# Patient Record
Sex: Male | Born: 1945 | Race: White | Hispanic: No | Smoking: Former smoker
Health system: Southern US, Community
[De-identification: ages and names within clinical notes are randomized; demographics above are authoritative.]

## PROBLEM LIST (undated history)

## (undated) DIAGNOSIS — I639 Cerebral infarction, unspecified: Secondary | ICD-10-CM

## (undated) DIAGNOSIS — I739 Peripheral vascular disease, unspecified: Secondary | ICD-10-CM

## (undated) DIAGNOSIS — R9430 Abnormal result of cardiovascular function study, unspecified: Secondary | ICD-10-CM

## (undated) DIAGNOSIS — I10 Essential (primary) hypertension: Secondary | ICD-10-CM

## (undated) DIAGNOSIS — Z7289 Other problems related to lifestyle: Secondary | ICD-10-CM

## (undated) DIAGNOSIS — I340 Nonrheumatic mitral (valve) insufficiency: Secondary | ICD-10-CM

## (undated) DIAGNOSIS — I4891 Unspecified atrial fibrillation: Secondary | ICD-10-CM

## (undated) DIAGNOSIS — I779 Disorder of arteries and arterioles, unspecified: Secondary | ICD-10-CM

## (undated) DIAGNOSIS — T148XXA Other injury of unspecified body region, initial encounter: Secondary | ICD-10-CM

## (undated) DIAGNOSIS — I509 Heart failure, unspecified: Secondary | ICD-10-CM

## (undated) DIAGNOSIS — M25539 Pain in unspecified wrist: Secondary | ICD-10-CM

## (undated) DIAGNOSIS — N189 Chronic kidney disease, unspecified: Secondary | ICD-10-CM

## (undated) DIAGNOSIS — F4310 Post-traumatic stress disorder, unspecified: Secondary | ICD-10-CM

## (undated) DIAGNOSIS — I7100 Dissection of unspecified site of aorta: Secondary | ICD-10-CM

## (undated) DIAGNOSIS — R339 Retention of urine, unspecified: Secondary | ICD-10-CM

## (undated) DIAGNOSIS — IMO0002 Reserved for concepts with insufficient information to code with codable children: Secondary | ICD-10-CM

## (undated) DIAGNOSIS — I351 Nonrheumatic aortic (valve) insufficiency: Secondary | ICD-10-CM

## (undated) DIAGNOSIS — F1090 Alcohol use, unspecified, uncomplicated: Secondary | ICD-10-CM

## (undated) DIAGNOSIS — N400 Enlarged prostate without lower urinary tract symptoms: Secondary | ICD-10-CM

## (undated) DIAGNOSIS — R079 Chest pain, unspecified: Secondary | ICD-10-CM

## (undated) DIAGNOSIS — Z789 Other specified health status: Secondary | ICD-10-CM

## (undated) HISTORY — PX: EXTRACORPOREAL CIRCULATION: SHX266

## (undated) HISTORY — PX: MEDIAN STERNOTOMY: SUR860

## (undated) HISTORY — DX: Disorder of arteries and arterioles, unspecified: I77.9

## (undated) HISTORY — DX: Alcohol use, unspecified, uncomplicated: F10.90

## (undated) HISTORY — DX: Other injury of unspecified body region, initial encounter: T14.8XXA

## (undated) HISTORY — DX: Benign prostatic hyperplasia without lower urinary tract symptoms: N40.0

## (undated) HISTORY — PX: CARDIAC VALVE REPLACEMENT: SHX585

## (undated) HISTORY — DX: Abnormal result of cardiovascular function study, unspecified: R94.30

## (undated) HISTORY — DX: Retention of urine, unspecified: R33.9

## (undated) HISTORY — PX: LUMBAR LAMINECTOMY: SHX95

## (undated) HISTORY — DX: Reserved for concepts with insufficient information to code with codable children: IMO0002

## (undated) HISTORY — DX: Other specified health status: Z78.9

## (undated) HISTORY — DX: Unspecified atrial fibrillation: I48.91

## (undated) HISTORY — DX: Essential (primary) hypertension: I10

## (undated) HISTORY — DX: Other problems related to lifestyle: Z72.89

## (undated) HISTORY — DX: Cerebral infarction, unspecified: I63.9

## (undated) HISTORY — DX: Peripheral vascular disease, unspecified: I73.9

## (undated) HISTORY — DX: Pain in unspecified wrist: M25.539

## (undated) HISTORY — PX: HEMORRHOID SURGERY: SHX153

## (undated) HISTORY — PX: BACK SURGERY: SHX140

## (undated) HISTORY — DX: Chest pain, unspecified: R07.9

---

## 2001-02-23 ENCOUNTER — Encounter: Payer: Self-pay | Admitting: Internal Medicine

## 2001-02-23 ENCOUNTER — Ambulatory Visit (HOSPITAL_COMMUNITY): Admission: RE | Admit: 2001-02-23 | Discharge: 2001-02-23 | Payer: Self-pay | Admitting: Internal Medicine

## 2002-01-12 ENCOUNTER — Encounter: Payer: Self-pay | Admitting: Internal Medicine

## 2002-01-12 ENCOUNTER — Ambulatory Visit (HOSPITAL_COMMUNITY): Admission: RE | Admit: 2002-01-12 | Discharge: 2002-01-12 | Payer: Self-pay | Admitting: Internal Medicine

## 2002-02-03 ENCOUNTER — Ambulatory Visit (HOSPITAL_COMMUNITY): Admission: RE | Admit: 2002-02-03 | Discharge: 2002-02-03 | Payer: Self-pay | Admitting: Internal Medicine

## 2002-03-22 ENCOUNTER — Ambulatory Visit (HOSPITAL_COMMUNITY): Admission: RE | Admit: 2002-03-22 | Discharge: 2002-03-22 | Payer: Self-pay | Admitting: Internal Medicine

## 2002-03-22 ENCOUNTER — Encounter: Payer: Self-pay | Admitting: Internal Medicine

## 2003-02-20 ENCOUNTER — Emergency Department (HOSPITAL_COMMUNITY): Admission: EM | Admit: 2003-02-20 | Discharge: 2003-02-20 | Payer: Self-pay | Admitting: *Deleted

## 2003-02-20 ENCOUNTER — Encounter: Payer: Self-pay | Admitting: *Deleted

## 2003-09-15 ENCOUNTER — Ambulatory Visit (HOSPITAL_COMMUNITY): Admission: RE | Admit: 2003-09-15 | Discharge: 2003-09-15 | Payer: Self-pay | Admitting: Internal Medicine

## 2003-09-26 ENCOUNTER — Ambulatory Visit (HOSPITAL_COMMUNITY): Admission: RE | Admit: 2003-09-26 | Discharge: 2003-09-26 | Payer: Self-pay | Admitting: Internal Medicine

## 2008-06-23 ENCOUNTER — Ambulatory Visit: Payer: Self-pay | Admitting: Cardiology

## 2008-06-23 ENCOUNTER — Inpatient Hospital Stay (HOSPITAL_COMMUNITY): Admission: EM | Admit: 2008-06-23 | Discharge: 2008-06-28 | Payer: Self-pay | Admitting: Emergency Medicine

## 2008-06-23 ENCOUNTER — Ambulatory Visit: Payer: Self-pay | Admitting: Orthopedic Surgery

## 2008-06-24 ENCOUNTER — Encounter (INDEPENDENT_AMBULATORY_CARE_PROVIDER_SITE_OTHER): Payer: Self-pay | Admitting: Family Medicine

## 2008-06-28 ENCOUNTER — Encounter: Payer: Self-pay | Admitting: Orthopedic Surgery

## 2008-07-29 ENCOUNTER — Emergency Department (HOSPITAL_COMMUNITY): Admission: EM | Admit: 2008-07-29 | Discharge: 2008-07-29 | Payer: Self-pay | Admitting: Emergency Medicine

## 2008-10-21 ENCOUNTER — Ambulatory Visit: Payer: Self-pay | Admitting: Cardiology

## 2008-10-21 ENCOUNTER — Encounter: Payer: Self-pay | Admitting: Internal Medicine

## 2008-10-21 ENCOUNTER — Ambulatory Visit: Payer: Self-pay | Admitting: Thoracic Surgery (Cardiothoracic Vascular Surgery)

## 2008-10-21 ENCOUNTER — Encounter: Payer: Self-pay | Admitting: Thoracic Surgery (Cardiothoracic Vascular Surgery)

## 2008-10-21 ENCOUNTER — Inpatient Hospital Stay (HOSPITAL_COMMUNITY): Admission: AD | Admit: 2008-10-21 | Discharge: 2008-11-14 | Payer: Self-pay | Admitting: Cardiology

## 2008-10-21 HISTORY — PX: AORTIC VALVE REPLACEMENT: SHX41

## 2008-11-10 ENCOUNTER — Ambulatory Visit: Payer: Self-pay | Admitting: Physical Medicine & Rehabilitation

## 2008-11-16 DIAGNOSIS — I10 Essential (primary) hypertension: Secondary | ICD-10-CM | POA: Insufficient documentation

## 2008-11-16 DIAGNOSIS — M25539 Pain in unspecified wrist: Secondary | ICD-10-CM

## 2008-11-16 DIAGNOSIS — T148XXA Other injury of unspecified body region, initial encounter: Secondary | ICD-10-CM | POA: Insufficient documentation

## 2008-11-18 ENCOUNTER — Emergency Department (HOSPITAL_COMMUNITY): Admission: EM | Admit: 2008-11-18 | Discharge: 2008-11-18 | Payer: Self-pay | Admitting: Emergency Medicine

## 2008-11-25 ENCOUNTER — Ambulatory Visit: Payer: Self-pay | Admitting: Thoracic Surgery (Cardiothoracic Vascular Surgery)

## 2008-11-25 ENCOUNTER — Encounter
Admission: RE | Admit: 2008-11-25 | Discharge: 2008-11-25 | Payer: Self-pay | Admitting: Thoracic Surgery (Cardiothoracic Vascular Surgery)

## 2008-12-12 ENCOUNTER — Ambulatory Visit: Payer: Self-pay | Admitting: Cardiology

## 2009-02-10 ENCOUNTER — Ambulatory Visit: Payer: Self-pay | Admitting: Cardiothoracic Surgery

## 2009-02-10 ENCOUNTER — Ambulatory Visit: Payer: Self-pay | Admitting: Cardiology

## 2009-02-10 ENCOUNTER — Encounter: Payer: Self-pay | Admitting: Cardiothoracic Surgery

## 2009-02-10 ENCOUNTER — Observation Stay (HOSPITAL_COMMUNITY): Admission: EM | Admit: 2009-02-10 | Discharge: 2009-02-12 | Payer: Self-pay | Admitting: Emergency Medicine

## 2009-02-11 ENCOUNTER — Encounter: Payer: Self-pay | Admitting: Cardiothoracic Surgery

## 2009-02-12 ENCOUNTER — Encounter: Payer: Self-pay | Admitting: Cardiothoracic Surgery

## 2009-05-08 ENCOUNTER — Encounter
Admission: RE | Admit: 2009-05-08 | Discharge: 2009-05-08 | Payer: Self-pay | Admitting: Thoracic Surgery (Cardiothoracic Vascular Surgery)

## 2009-05-23 ENCOUNTER — Ambulatory Visit: Payer: Self-pay | Admitting: Cardiology

## 2009-05-23 DIAGNOSIS — F341 Dysthymic disorder: Secondary | ICD-10-CM

## 2009-06-01 ENCOUNTER — Encounter (INDEPENDENT_AMBULATORY_CARE_PROVIDER_SITE_OTHER): Payer: Self-pay | Admitting: *Deleted

## 2009-06-07 ENCOUNTER — Encounter: Payer: Self-pay | Admitting: Cardiology

## 2009-06-08 ENCOUNTER — Encounter: Payer: Self-pay | Admitting: Cardiology

## 2009-06-09 ENCOUNTER — Encounter: Payer: Self-pay | Admitting: Cardiology

## 2009-06-14 ENCOUNTER — Encounter: Payer: Self-pay | Admitting: Cardiology

## 2009-06-26 ENCOUNTER — Encounter: Payer: Self-pay | Admitting: Cardiology

## 2009-10-30 IMAGING — CR DG CHEST 2V
2 series · 2 of 2 positions shown · non-contrast
Comparison: 11/07/2008

CLINICAL DATA: Left chest pain.  Status post CABG 3 weeks ago.
Acute chest pain today.

CHEST - 2 VIEW

[w chest lat]
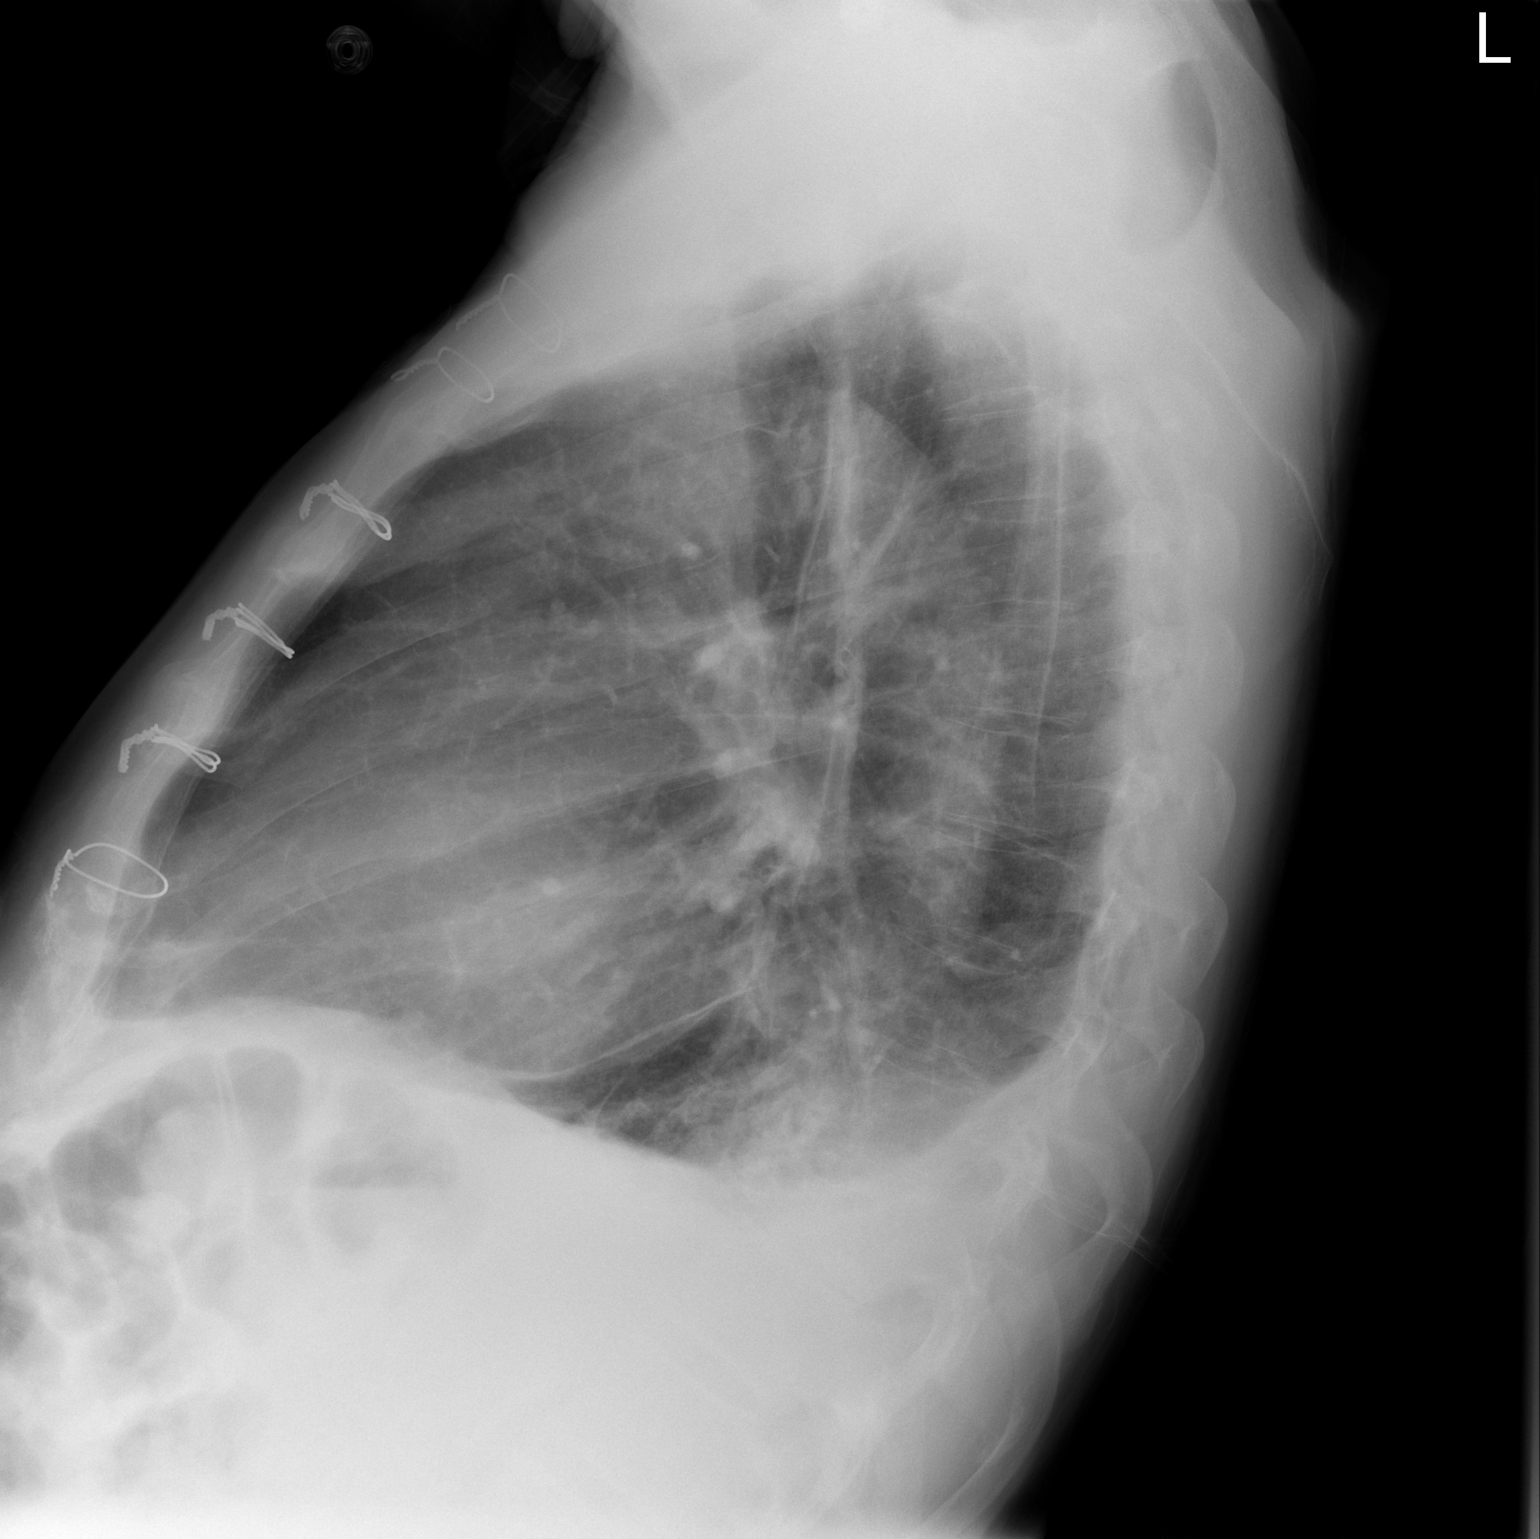

[w chest pa]
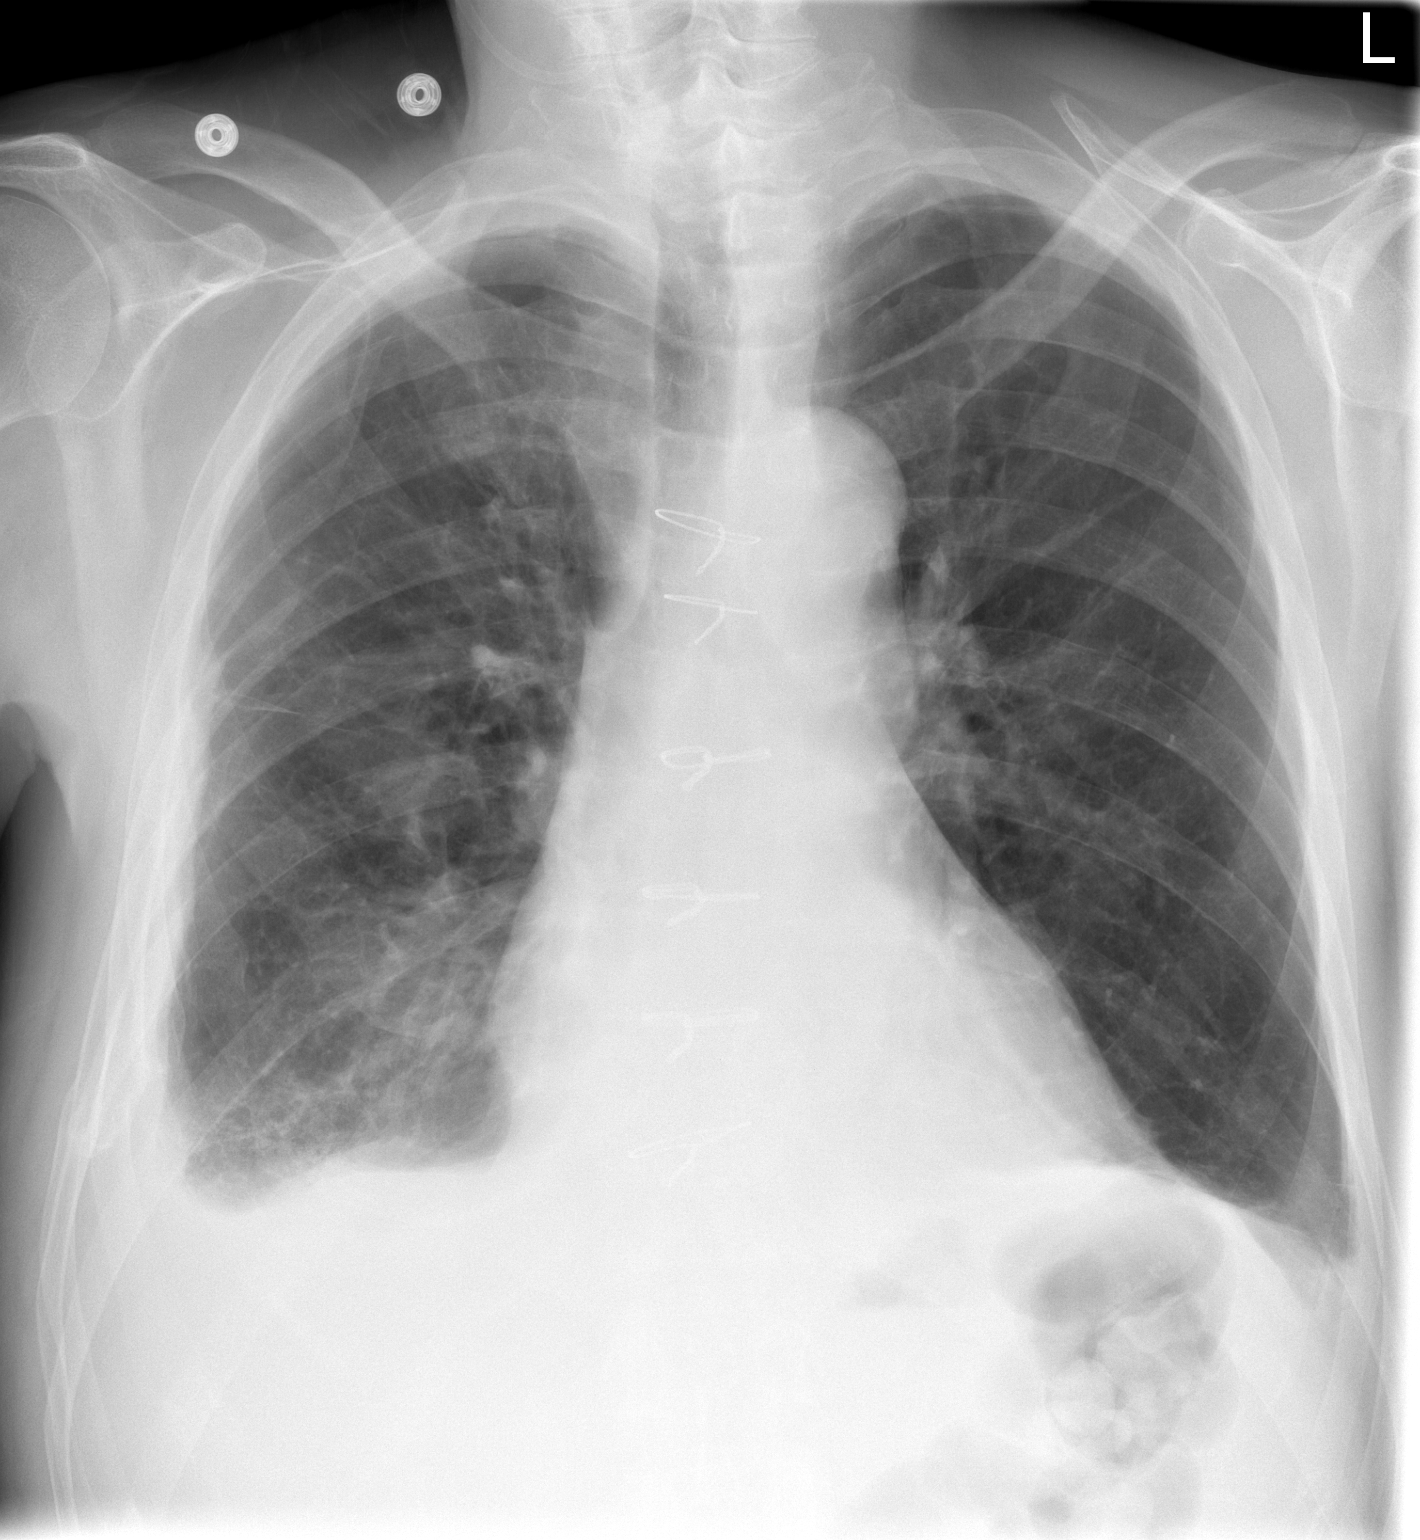

[2 of 2 positions shown; findings below may reference images not displayed]

FINDINGS: Patient has had a median sternotomy and CABG.  Heart is
enlarged.  There are bilateral pleural effusions and bibasilar
atelectasis with little change since prior study.  Left PICC line
has been removed.  No definite consolidations are identified.
There is no evidence for pulmonary edema.
IMPRESSION: Persistent bibasilar atelectasis and small effusions with little
interval change.

## 2010-09-03 ENCOUNTER — Encounter: Payer: Self-pay | Admitting: Thoracic Surgery (Cardiothoracic Vascular Surgery)

## 2010-11-18 LAB — CBC
HCT: 34.5 % — ABNORMAL LOW (ref 39.0–52.0)
HCT: 36.4 % — ABNORMAL LOW (ref 39.0–52.0)
HCT: 39.2 % (ref 39.0–52.0)
Hemoglobin: 11.7 g/dL — ABNORMAL LOW (ref 13.0–17.0)
Hemoglobin: 12.3 g/dL — ABNORMAL LOW (ref 13.0–17.0)
Hemoglobin: 13.3 g/dL (ref 13.0–17.0)
MCHC: 33.8 g/dL (ref 30.0–36.0)
MCHC: 33.8 g/dL (ref 30.0–36.0)
MCHC: 34 g/dL (ref 30.0–36.0)
MCV: 76.7 fL — ABNORMAL LOW (ref 78.0–100.0)
MCV: 77 fL — ABNORMAL LOW (ref 78.0–100.0)
MCV: 77.1 fL — ABNORMAL LOW (ref 78.0–100.0)
Platelets: 227 10*3/uL (ref 150–400)
Platelets: 244 10*3/uL (ref 150–400)
Platelets: 282 10*3/uL (ref 150–400)
RBC: 4.48 MIL/uL (ref 4.22–5.81)
RBC: 4.72 MIL/uL (ref 4.22–5.81)
RBC: 5.11 MIL/uL (ref 4.22–5.81)
RDW: 17 % — ABNORMAL HIGH (ref 11.5–15.5)
RDW: 17.1 % — ABNORMAL HIGH (ref 11.5–15.5)
RDW: 17.1 % — ABNORMAL HIGH (ref 11.5–15.5)
WBC: 7.3 10*3/uL (ref 4.0–10.5)
WBC: 7.8 10*3/uL (ref 4.0–10.5)
WBC: 9.4 10*3/uL (ref 4.0–10.5)

## 2010-11-18 LAB — BASIC METABOLIC PANEL
BUN: 25 mg/dL — ABNORMAL HIGH (ref 6–23)
BUN: 25 mg/dL — ABNORMAL HIGH (ref 6–23)
CO2: 26 mEq/L (ref 19–32)
CO2: 27 mEq/L (ref 19–32)
Calcium: 9.3 mg/dL (ref 8.4–10.5)
Calcium: 9.4 mg/dL (ref 8.4–10.5)
Chloride: 104 mEq/L (ref 96–112)
Chloride: 106 mEq/L (ref 96–112)
Creatinine, Ser: 2.02 mg/dL — ABNORMAL HIGH (ref 0.4–1.5)
Creatinine, Ser: 2.16 mg/dL — ABNORMAL HIGH (ref 0.4–1.5)
GFR calc Af Amer: 38 mL/min — ABNORMAL LOW (ref >60)
GFR calc Af Amer: 41 mL/min — ABNORMAL LOW (ref >60)
GFR calc non Af Amer: 31 mL/min — ABNORMAL LOW (ref >60)
GFR calc non Af Amer: 34 mL/min — ABNORMAL LOW (ref >60)
Glucose, Bld: 118 mg/dL — ABNORMAL HIGH (ref 70–99)
Glucose, Bld: 88 mg/dL (ref 70–99)
Potassium: 3.7 mEq/L (ref 3.5–5.1)
Potassium: 4.3 mEq/L (ref 3.5–5.1)
Sodium: 138 mEq/L (ref 135–145)
Sodium: 139 mEq/L (ref 135–145)

## 2010-11-18 LAB — DIFFERENTIAL
Basophils Absolute: 0.1 10*3/uL (ref 0.0–0.1)
Basophils Relative: 1 % (ref 0–1)
Eosinophils Absolute: 0.1 10*3/uL (ref 0.0–0.7)
Eosinophils Relative: 1 % (ref 0–5)
Lymphocytes Relative: 20 % (ref 12–46)
Lymphs Abs: 1.9 10*3/uL (ref 0.7–4.0)
Monocytes Absolute: 0.7 10*3/uL (ref 0.1–1.0)
Monocytes Relative: 7 % (ref 3–12)
Neutro Abs: 6.6 10*3/uL (ref 1.7–7.7)
Neutrophils Relative %: 71 % (ref 43–77)

## 2010-11-18 LAB — COMPREHENSIVE METABOLIC PANEL
ALT: 11 U/L (ref 0–53)
AST: 10 U/L (ref 0–37)
Albumin: 3.9 g/dL (ref 3.5–5.2)
Alkaline Phosphatase: 134 U/L — ABNORMAL HIGH (ref 39–117)
BUN: 24 mg/dL — ABNORMAL HIGH (ref 6–23)
CO2: 27 mEq/L (ref 19–32)
Calcium: 9.7 mg/dL (ref 8.4–10.5)
Chloride: 103 mEq/L (ref 96–112)
Creatinine, Ser: 2.04 mg/dL — ABNORMAL HIGH (ref 0.4–1.5)
GFR calc Af Amer: 40 mL/min — ABNORMAL LOW (ref >60)
GFR calc non Af Amer: 33 mL/min — ABNORMAL LOW (ref >60)
Glucose, Bld: 87 mg/dL (ref 70–99)
Potassium: 4.4 mEq/L (ref 3.5–5.1)
Sodium: 138 mEq/L (ref 135–145)
Total Bilirubin: 1.5 mg/dL — ABNORMAL HIGH (ref 0.3–1.2)
Total Protein: 6.9 g/dL (ref 6.0–8.3)

## 2010-11-18 LAB — POCT CARDIAC MARKERS

## 2010-11-18 LAB — PROTIME-INR
INR: 1.1 (ref 0.00–1.49)
Prothrombin Time: 15 seconds (ref 11.6–15.2)

## 2010-11-18 LAB — APTT: aPTT: 41 seconds — ABNORMAL HIGH (ref 24–37)

## 2010-11-21 LAB — URINALYSIS, ROUTINE W REFLEX MICROSCOPIC
Glucose, UA: NEGATIVE mg/dL
Ketones, ur: NEGATIVE mg/dL
Nitrite: NEGATIVE
Protein, ur: NEGATIVE mg/dL
Urobilinogen, UA: 1 mg/dL (ref 0.0–1.0)

## 2010-11-21 LAB — BASIC METABOLIC PANEL
BUN: 21 mg/dL (ref 6–23)
BUN: 23 mg/dL (ref 6–23)
BUN: 23 mg/dL (ref 6–23)
Calcium: 8 mg/dL — ABNORMAL LOW (ref 8.4–10.5)
Calcium: 8.4 mg/dL (ref 8.4–10.5)
Calcium: 8.5 mg/dL (ref 8.4–10.5)
Calcium: 8.8 mg/dL (ref 8.4–10.5)
Chloride: 108 mEq/L (ref 96–112)
Creatinine, Ser: 2.44 mg/dL — ABNORMAL HIGH (ref 0.4–1.5)
Creatinine, Ser: 2.61 mg/dL — ABNORMAL HIGH (ref 0.4–1.5)
Creatinine, Ser: 2.62 mg/dL — ABNORMAL HIGH (ref 0.4–1.5)
GFR calc Af Amer: 30 mL/min — ABNORMAL LOW (ref >60)
GFR calc Af Amer: 30 mL/min — ABNORMAL LOW (ref >60)
GFR calc Af Amer: 33 mL/min — ABNORMAL LOW (ref >60)
GFR calc non Af Amer: 24 mL/min — ABNORMAL LOW (ref >60)
GFR calc non Af Amer: 25 mL/min — ABNORMAL LOW (ref >60)
GFR calc non Af Amer: 25 mL/min — ABNORMAL LOW (ref >60)
GFR calc non Af Amer: 27 mL/min — ABNORMAL LOW (ref >60)
Glucose, Bld: 99 mg/dL (ref 70–99)
Potassium: 4.7 mEq/L (ref 3.5–5.1)
Sodium: 134 mEq/L — ABNORMAL LOW (ref 135–145)

## 2010-11-21 LAB — POCT CARDIAC MARKERS
CKMB, poc: <1 ng/mL — ABNORMAL LOW (ref 1.0–8.0)
Troponin i, poc: <0.05 ng/mL (ref 0.00–0.09)

## 2010-11-21 LAB — CBC
HCT: 30.3 % — ABNORMAL LOW (ref 39.0–52.0)
MCV: 81.5 fL (ref 78.0–100.0)
MCV: 82.9 fL (ref 78.0–100.0)
Platelets: 277 10*3/uL (ref 150–400)
Platelets: 281 10*3/uL (ref 150–400)
RBC: 3.4 MIL/uL — ABNORMAL LOW (ref 4.22–5.81)
RBC: 3.71 MIL/uL — ABNORMAL LOW (ref 4.22–5.81)
WBC: 5.4 10*3/uL (ref 4.0–10.5)
WBC: 5.5 10*3/uL (ref 4.0–10.5)

## 2010-11-22 LAB — POCT I-STAT 4, (NA,K, GLUC, HGB,HCT)
Glucose, Bld: 118 mg/dL — ABNORMAL HIGH (ref 70–99)
Glucose, Bld: 136 mg/dL — ABNORMAL HIGH (ref 70–99)
Glucose, Bld: 141 mg/dL — ABNORMAL HIGH (ref 70–99)
Glucose, Bld: 152 mg/dL — ABNORMAL HIGH (ref 70–99)
Glucose, Bld: 154 mg/dL — ABNORMAL HIGH (ref 70–99)
Glucose, Bld: 157 mg/dL — ABNORMAL HIGH (ref 70–99)
Glucose, Bld: 187 mg/dL — ABNORMAL HIGH (ref 70–99)
HCT: 20 % — ABNORMAL LOW (ref 39.0–52.0)
HCT: 20 % — ABNORMAL LOW (ref 39.0–52.0)
HCT: 23 % — ABNORMAL LOW (ref 39.0–52.0)
HCT: 26 % — ABNORMAL LOW (ref 39.0–52.0)
HCT: 27 % — ABNORMAL LOW (ref 39.0–52.0)
HCT: 28 % — ABNORMAL LOW (ref 39.0–52.0)
HCT: 35 % — ABNORMAL LOW (ref 39.0–52.0)
Hemoglobin: 6.8 g/dL — CL (ref 13.0–17.0)
Hemoglobin: 6.8 g/dL — CL (ref 13.0–17.0)
Hemoglobin: 7.5 g/dL — CL (ref 13.0–17.0)
Hemoglobin: 7.5 g/dL — CL (ref 13.0–17.0)
Hemoglobin: 7.8 g/dL — CL (ref 13.0–17.0)
Hemoglobin: 8.2 g/dL — ABNORMAL LOW (ref 13.0–17.0)
Hemoglobin: 8.5 g/dL — ABNORMAL LOW (ref 13.0–17.0)
Hemoglobin: 8.8 g/dL — ABNORMAL LOW (ref 13.0–17.0)
Hemoglobin: 9.2 g/dL — ABNORMAL LOW (ref 13.0–17.0)
Hemoglobin: 9.5 g/dL — ABNORMAL LOW (ref 13.0–17.0)
Potassium: 4.9 mEq/L (ref 3.5–5.1)
Potassium: 5 mEq/L (ref 3.5–5.1)
Potassium: 5.3 mEq/L — ABNORMAL HIGH (ref 3.5–5.1)
Potassium: 5.4 mEq/L — ABNORMAL HIGH (ref 3.5–5.1)
Potassium: 5.6 mEq/L — ABNORMAL HIGH (ref 3.5–5.1)
Potassium: 5.6 mEq/L — ABNORMAL HIGH (ref 3.5–5.1)
Potassium: 5.8 mEq/L — ABNORMAL HIGH (ref 3.5–5.1)
Sodium: 128 mEq/L — ABNORMAL LOW (ref 135–145)
Sodium: 131 mEq/L — ABNORMAL LOW (ref 135–145)
Sodium: 132 mEq/L — ABNORMAL LOW (ref 135–145)
Sodium: 133 mEq/L — ABNORMAL LOW (ref 135–145)
Sodium: 133 mEq/L — ABNORMAL LOW (ref 135–145)
Sodium: 136 mEq/L (ref 135–145)
Sodium: 137 mEq/L (ref 135–145)

## 2010-11-22 LAB — POCT I-STAT 3, ART BLOOD GAS (G3+)
Acid-base deficit: 2 mmol/L (ref 0.0–2.0)
Acid-base deficit: 5 mmol/L — ABNORMAL HIGH (ref 0.0–2.0)
Bicarbonate: 23.7 mEq/L (ref 20.0–24.0)
Bicarbonate: 24.2 mEq/L — ABNORMAL HIGH (ref 20.0–24.0)
Bicarbonate: 25.1 mEq/L — ABNORMAL HIGH (ref 20.0–24.0)
O2 Saturation: 100 %
O2 Saturation: 100 %
O2 Saturation: 98 %
O2 Saturation: 99 %
TCO2: 25 mmol/L (ref 0–100)
TCO2: 26 mmol/L (ref 0–100)
TCO2: 26 mmol/L (ref 0–100)
TCO2: 26 mmol/L (ref 0–100)
pCO2 arterial: 25.1 mmHg — ABNORMAL LOW (ref 35.0–45.0)
pCO2 arterial: 31.5 mmHg — ABNORMAL LOW (ref 35.0–45.0)
pCO2 arterial: 40.3 mmHg (ref 35.0–45.0)
pCO2 arterial: 42.2 mmHg (ref 35.0–45.0)
pCO2 arterial: 42.2 mmHg (ref 35.0–45.0)
pCO2 arterial: 44 mmHg (ref 35.0–45.0)
pCO2 arterial: 45.7 mmHg — ABNORMAL HIGH (ref 35.0–45.0)
pH, Arterial: 7.275 — ABNORMAL LOW (ref 7.350–7.450)
pH, Arterial: 7.361 (ref 7.350–7.450)
pH, Arterial: 7.365 (ref 7.350–7.450)
pH, Arterial: 7.372 (ref 7.350–7.450)
pH, Arterial: 7.379 (ref 7.350–7.450)
pO2, Arterial: 102 mmHg — ABNORMAL HIGH (ref 80.0–100.0)
pO2, Arterial: 111 mmHg — ABNORMAL HIGH (ref 80.0–100.0)
pO2, Arterial: 113 mmHg — ABNORMAL HIGH (ref 80.0–100.0)
pO2, Arterial: 148 mmHg — ABNORMAL HIGH (ref 80.0–100.0)
pO2, Arterial: 466 mmHg — ABNORMAL HIGH (ref 80.0–100.0)
pO2, Arterial: 567 mmHg — ABNORMAL HIGH (ref 80.0–100.0)

## 2010-11-22 LAB — CROSSMATCH
ABO/RH(D): A POS
Antibody Screen: NEGATIVE
Antibody Screen: NEGATIVE

## 2010-11-22 LAB — POCT I-STAT, CHEM 8
BUN: 29 mg/dL — ABNORMAL HIGH (ref 6–23)
Calcium, Ion: 1.11 mmol/L — ABNORMAL LOW (ref 1.12–1.32)
Calcium, Ion: 1.15 mmol/L (ref 1.12–1.32)
Chloride: 105 mEq/L (ref 96–112)
Chloride: 107 mEq/L (ref 96–112)
Glucose, Bld: 130 mg/dL — ABNORMAL HIGH (ref 70–99)
HCT: 24 % — ABNORMAL LOW (ref 39.0–52.0)
HCT: 29 % — ABNORMAL LOW (ref 39.0–52.0)
Hemoglobin: 8.2 g/dL — ABNORMAL LOW (ref 13.0–17.0)
Hemoglobin: 9.9 g/dL — ABNORMAL LOW (ref 13.0–17.0)
Potassium: 4.3 mEq/L (ref 3.5–5.1)
Sodium: 139 mEq/L (ref 135–145)
TCO2: 28 mmol/L (ref 0–100)

## 2010-11-22 LAB — BASIC METABOLIC PANEL
BUN: 20 mg/dL (ref 6–23)
BUN: 22 mg/dL (ref 6–23)
BUN: 23 mg/dL (ref 6–23)
BUN: 34 mg/dL — ABNORMAL HIGH (ref 6–23)
BUN: 45 mg/dL — ABNORMAL HIGH (ref 6–23)
BUN: 46 mg/dL — ABNORMAL HIGH (ref 6–23)
BUN: 47 mg/dL — ABNORMAL HIGH (ref 6–23)
BUN: 49 mg/dL — ABNORMAL HIGH (ref 6–23)
BUN: 50 mg/dL — ABNORMAL HIGH (ref 6–23)
CO2: 23 mEq/L (ref 19–32)
CO2: 27 mEq/L (ref 19–32)
CO2: 28 mEq/L (ref 19–32)
CO2: 29 mEq/L (ref 19–32)
CO2: 30 mEq/L (ref 19–32)
CO2: 25 mEq/L (ref 19–32)
Calcium: 8 mg/dL — ABNORMAL LOW (ref 8.4–10.5)
Calcium: 8.2 mg/dL — ABNORMAL LOW (ref 8.4–10.5)
Calcium: 8.2 mg/dL — ABNORMAL LOW (ref 8.4–10.5)
Calcium: 8.3 mg/dL — ABNORMAL LOW (ref 8.4–10.5)
Calcium: 8.6 mg/dL (ref 8.4–10.5)
Calcium: 7.9 mg/dL — ABNORMAL LOW (ref 8.4–10.5)
Calcium: 8.2 mg/dL — ABNORMAL LOW (ref 8.4–10.5)
Calcium: 8.3 mg/dL — ABNORMAL LOW (ref 8.4–10.5)
Chloride: 105 mEq/L (ref 96–112)
Chloride: 108 mEq/L (ref 96–112)
Chloride: 109 mEq/L (ref 96–112)
Chloride: 111 mEq/L (ref 96–112)
Chloride: 117 mEq/L — ABNORMAL HIGH (ref 96–112)
Chloride: 117 mEq/L — ABNORMAL HIGH (ref 96–112)
Chloride: 109 mEq/L (ref 96–112)
Creatinine, Ser: 2.02 mg/dL — ABNORMAL HIGH (ref 0.4–1.5)
Creatinine, Ser: 2.18 mg/dL — ABNORMAL HIGH (ref 0.4–1.5)
Creatinine, Ser: 2.18 mg/dL — ABNORMAL HIGH (ref 0.4–1.5)
Creatinine, Ser: 2.32 mg/dL — ABNORMAL HIGH (ref 0.4–1.5)
Creatinine, Ser: 2.4 mg/dL — ABNORMAL HIGH (ref 0.4–1.5)
Creatinine, Ser: 2.45 mg/dL — ABNORMAL HIGH (ref 0.4–1.5)
Creatinine, Ser: 2.54 mg/dL — ABNORMAL HIGH (ref 0.4–1.5)
Creatinine, Ser: 2.54 mg/dL — ABNORMAL HIGH (ref 0.4–1.5)
Creatinine, Ser: 2.63 mg/dL — ABNORMAL HIGH (ref 0.4–1.5)
Creatinine, Ser: 2.71 mg/dL — ABNORMAL HIGH (ref 0.4–1.5)
GFR calc Af Amer: 31 mL/min — ABNORMAL LOW (ref >60)
GFR calc Af Amer: 35 mL/min — ABNORMAL LOW (ref >60)
GFR calc Af Amer: 36 mL/min — ABNORMAL LOW (ref >60)
GFR calc Af Amer: 37 mL/min — ABNORMAL LOW (ref >60)
GFR calc Af Amer: 37 mL/min — ABNORMAL LOW (ref >60)
GFR calc Af Amer: 38 mL/min — ABNORMAL LOW (ref >60)
GFR calc Af Amer: 38 mL/min — ABNORMAL LOW (ref >60)
GFR calc Af Amer: 41 mL/min — ABNORMAL LOW (ref >60)
GFR calc Af Amer: 29 mL/min — ABNORMAL LOW (ref >60)
GFR calc Af Amer: 38 mL/min — ABNORMAL LOW (ref >60)
GFR calc non Af Amer: 26 mL/min — ABNORMAL LOW (ref >60)
GFR calc non Af Amer: 27 mL/min — ABNORMAL LOW (ref >60)
GFR calc non Af Amer: 28 mL/min — ABNORMAL LOW (ref >60)
GFR calc non Af Amer: 30 mL/min — ABNORMAL LOW (ref >60)
GFR calc non Af Amer: 31 mL/min — ABNORMAL LOW (ref >60)
GFR calc non Af Amer: 24 mL/min — ABNORMAL LOW (ref >60)
GFR calc non Af Amer: 25 mL/min — ABNORMAL LOW (ref >60)
Glucose, Bld: 140 mg/dL — ABNORMAL HIGH (ref 70–99)
Glucose, Bld: 84 mg/dL (ref 70–99)
Glucose, Bld: 94 mg/dL (ref 70–99)
Glucose, Bld: 112 mg/dL — ABNORMAL HIGH (ref 70–99)
Glucose, Bld: 140 mg/dL — ABNORMAL HIGH (ref 70–99)
Potassium: 3 mEq/L — ABNORMAL LOW (ref 3.5–5.1)
Potassium: 3.5 mEq/L (ref 3.5–5.1)
Potassium: 3.6 mEq/L (ref 3.5–5.1)
Potassium: 3.7 mEq/L (ref 3.5–5.1)
Potassium: 3.8 mEq/L (ref 3.5–5.1)
Potassium: 4.2 mEq/L (ref 3.5–5.1)
Potassium: 4.7 mEq/L (ref 3.5–5.1)
Potassium: 5.4 mEq/L — ABNORMAL HIGH (ref 3.5–5.1)
Sodium: 141 mEq/L (ref 135–145)
Sodium: 146 mEq/L — ABNORMAL HIGH (ref 135–145)
Sodium: 147 mEq/L — ABNORMAL HIGH (ref 135–145)
Sodium: 148 mEq/L — ABNORMAL HIGH (ref 135–145)
Sodium: 152 mEq/L — ABNORMAL HIGH (ref 135–145)
Sodium: 139 mEq/L (ref 135–145)
Sodium: 140 mEq/L (ref 135–145)
Sodium: 141 mEq/L (ref 135–145)

## 2010-11-22 LAB — DIC (DISSEMINATED INTRAVASCULAR COAGULATION)PANEL
D-Dimer, Quant: 3.97 ug/mL-FEU — ABNORMAL HIGH (ref 0.00–0.48)
Fibrinogen: 160 mg/dL — ABNORMAL LOW (ref 204–475)
Prothrombin Time: 22.1 seconds — ABNORMAL HIGH (ref 11.6–15.2)
aPTT: 48 seconds — ABNORMAL HIGH (ref 24–37)

## 2010-11-22 LAB — CBC
HCT: 27.2 % — ABNORMAL LOW (ref 39.0–52.0)
HCT: 27.9 % — ABNORMAL LOW (ref 39.0–52.0)
HCT: 31.7 % — ABNORMAL LOW (ref 39.0–52.0)
HCT: 32.2 % — ABNORMAL LOW (ref 39.0–52.0)
Hemoglobin: 10.9 g/dL — ABNORMAL LOW (ref 13.0–17.0)
Hemoglobin: 8.8 g/dL — ABNORMAL LOW (ref 13.0–17.0)
Hemoglobin: 9.3 g/dL — ABNORMAL LOW (ref 13.0–17.0)
Hemoglobin: 9.6 g/dL — ABNORMAL LOW (ref 13.0–17.0)
Hemoglobin: 9.7 g/dL — ABNORMAL LOW (ref 13.0–17.0)
Hemoglobin: 7.3 g/dL — CL (ref 13.0–17.0)
Hemoglobin: 8.4 g/dL — ABNORMAL LOW (ref 13.0–17.0)
Hemoglobin: 8.9 g/dL — ABNORMAL LOW (ref 13.0–17.0)
Hemoglobin: 9.5 g/dL — ABNORMAL LOW (ref 13.0–17.0)
Hemoglobin: 9.9 g/dL — ABNORMAL LOW (ref 13.0–17.0)
MCHC: 33.1 g/dL (ref 30.0–36.0)
MCHC: 33.6 g/dL (ref 30.0–36.0)
MCHC: 33.7 g/dL (ref 30.0–36.0)
MCHC: 34 g/dL (ref 30.0–36.0)
MCHC: 35.2 g/dL (ref 30.0–36.0)
MCHC: 34.4 g/dL (ref 30.0–36.0)
MCHC: 34.4 g/dL (ref 30.0–36.0)
MCHC: 34.6 g/dL (ref 30.0–36.0)
MCV: 84 fL (ref 78.0–100.0)
MCV: 84.6 fL (ref 78.0–100.0)
MCV: 86.1 fL (ref 78.0–100.0)
MCV: 86.4 fL (ref 78.0–100.0)
MCV: 86.4 fL (ref 78.0–100.0)
MCV: 87.1 fL (ref 78.0–100.0)
MCV: 82.2 fL (ref 78.0–100.0)
MCV: 82.7 fL (ref 78.0–100.0)
Platelets: 120 10*3/uL — ABNORMAL LOW (ref 150–400)
Platelets: 176 10*3/uL (ref 150–400)
Platelets: 188 10*3/uL (ref 150–400)
Platelets: 203 10*3/uL (ref 150–400)
Platelets: 251 10*3/uL (ref 150–400)
Platelets: 261 10*3/uL (ref 150–400)
Platelets: 88 10*3/uL — ABNORMAL LOW (ref 150–400)
Platelets: 70 10*3/uL — ABNORMAL LOW (ref 150–400)
RBC: 2.92 MIL/uL — ABNORMAL LOW (ref 4.22–5.81)
RBC: 3.08 MIL/uL — ABNORMAL LOW (ref 4.22–5.81)
RBC: 3.24 MIL/uL — ABNORMAL LOW (ref 4.22–5.81)
RBC: 3.27 MIL/uL — ABNORMAL LOW (ref 4.22–5.81)
RBC: 3.35 MIL/uL — ABNORMAL LOW (ref 4.22–5.81)
RBC: 3.64 MIL/uL — ABNORMAL LOW (ref 4.22–5.81)
RBC: 3.73 MIL/uL — ABNORMAL LOW (ref 4.22–5.81)
RBC: 2.56 MIL/uL — ABNORMAL LOW (ref 4.22–5.81)
RBC: 2.98 MIL/uL — ABNORMAL LOW (ref 4.22–5.81)
RBC: 3.35 MIL/uL — ABNORMAL LOW (ref 4.22–5.81)
RBC: 3.49 MIL/uL — ABNORMAL LOW (ref 4.22–5.81)
RDW: 15 % (ref 11.5–15.5)
RDW: 15.6 % — ABNORMAL HIGH (ref 11.5–15.5)
RDW: 14.1 % (ref 11.5–15.5)
RDW: 14.6 % (ref 11.5–15.5)
RDW: 14.8 % (ref 11.5–15.5)
WBC: 10.5 10*3/uL (ref 4.0–10.5)
WBC: 10.8 10*3/uL — ABNORMAL HIGH (ref 4.0–10.5)
WBC: 12.1 10*3/uL — ABNORMAL HIGH (ref 4.0–10.5)
WBC: 12.7 10*3/uL — ABNORMAL HIGH (ref 4.0–10.5)
WBC: 13.5 10*3/uL — ABNORMAL HIGH (ref 4.0–10.5)
WBC: 13.7 10*3/uL — ABNORMAL HIGH (ref 4.0–10.5)
WBC: 6.9 10*3/uL (ref 4.0–10.5)
WBC: 8.3 10*3/uL (ref 4.0–10.5)
WBC: 14.3 10*3/uL — ABNORMAL HIGH (ref 4.0–10.5)

## 2010-11-22 LAB — CULTURE, RESPIRATORY W GRAM STAIN

## 2010-11-22 LAB — GLUCOSE, CAPILLARY
Glucose-Capillary: 100 mg/dL — ABNORMAL HIGH (ref 70–99)
Glucose-Capillary: 103 mg/dL — ABNORMAL HIGH (ref 70–99)
Glucose-Capillary: 106 mg/dL — ABNORMAL HIGH (ref 70–99)
Glucose-Capillary: 109 mg/dL — ABNORMAL HIGH (ref 70–99)
Glucose-Capillary: 118 mg/dL — ABNORMAL HIGH (ref 70–99)
Glucose-Capillary: 136 mg/dL — ABNORMAL HIGH (ref 70–99)
Glucose-Capillary: 142 mg/dL — ABNORMAL HIGH (ref 70–99)
Glucose-Capillary: 146 mg/dL — ABNORMAL HIGH (ref 70–99)
Glucose-Capillary: 148 mg/dL — ABNORMAL HIGH (ref 70–99)
Glucose-Capillary: 149 mg/dL — ABNORMAL HIGH (ref 70–99)
Glucose-Capillary: 153 mg/dL — ABNORMAL HIGH (ref 70–99)
Glucose-Capillary: 154 mg/dL — ABNORMAL HIGH (ref 70–99)
Glucose-Capillary: 157 mg/dL — ABNORMAL HIGH (ref 70–99)
Glucose-Capillary: 161 mg/dL — ABNORMAL HIGH (ref 70–99)
Glucose-Capillary: 168 mg/dL — ABNORMAL HIGH (ref 70–99)
Glucose-Capillary: 181 mg/dL — ABNORMAL HIGH (ref 70–99)
Glucose-Capillary: 90 mg/dL (ref 70–99)
Glucose-Capillary: 97 mg/dL (ref 70–99)
Glucose-Capillary: 102 mg/dL — ABNORMAL HIGH (ref 70–99)
Glucose-Capillary: 107 mg/dL — ABNORMAL HIGH (ref 70–99)
Glucose-Capillary: 115 mg/dL — ABNORMAL HIGH (ref 70–99)
Glucose-Capillary: 117 mg/dL — ABNORMAL HIGH (ref 70–99)
Glucose-Capillary: 119 mg/dL — ABNORMAL HIGH (ref 70–99)
Glucose-Capillary: 120 mg/dL — ABNORMAL HIGH (ref 70–99)
Glucose-Capillary: 122 mg/dL — ABNORMAL HIGH (ref 70–99)
Glucose-Capillary: 133 mg/dL — ABNORMAL HIGH (ref 70–99)
Glucose-Capillary: 178 mg/dL — ABNORMAL HIGH (ref 70–99)
Glucose-Capillary: 85 mg/dL (ref 70–99)
Glucose-Capillary: 91 mg/dL (ref 70–99)

## 2010-11-22 LAB — DIFFERENTIAL
Basophils Absolute: 0.1 10*3/uL (ref 0.0–0.1)
Eosinophils Relative: 2 % (ref 0–5)
Lymphocytes Relative: 18 % (ref 12–46)
Neutro Abs: 4.5 10*3/uL (ref 1.7–7.7)
Neutrophils Relative %: 71 % (ref 43–77)

## 2010-11-22 LAB — COMPREHENSIVE METABOLIC PANEL
ALT: 48 U/L (ref 0–53)
AST: 62 U/L — ABNORMAL HIGH (ref 0–37)
Alkaline Phosphatase: 105 U/L (ref 39–117)
CO2: 30 mEq/L (ref 19–32)
Calcium: 8.6 mg/dL (ref 8.4–10.5)
Chloride: 114 mEq/L — ABNORMAL HIGH (ref 96–112)
GFR calc Af Amer: 30 mL/min — ABNORMAL LOW (ref >60)
GFR calc non Af Amer: 25 mL/min — ABNORMAL LOW (ref >60)
Potassium: 3.6 mEq/L (ref 3.5–5.1)
Sodium: 151 mEq/L — ABNORMAL HIGH (ref 135–145)
Total Bilirubin: 2.1 mg/dL — ABNORMAL HIGH (ref 0.3–1.2)

## 2010-11-22 LAB — PREPARE FRESH FROZEN PLASMA

## 2010-11-22 LAB — PLATELET COUNT: Platelets: 130 10*3/uL — ABNORMAL LOW (ref 150–400)

## 2010-11-22 LAB — POCT I-STAT 3, VENOUS BLOOD GAS (G3P V)
Acid-base deficit: 6 mmol/L — ABNORMAL HIGH (ref 0.0–2.0)
Bicarbonate: 19.7 mEq/L — ABNORMAL LOW (ref 20.0–24.0)
Bicarbonate: 21.6 mEq/L (ref 20.0–24.0)
TCO2: 21 mmol/L (ref 0–100)
pH, Ven: 7.264 (ref 7.250–7.300)
pO2, Ven: 53 mmHg — ABNORMAL HIGH (ref 30.0–45.0)
pO2, Ven: 91 mmHg — ABNORMAL HIGH (ref 30.0–45.0)

## 2010-11-22 LAB — CARDIAC PANEL(CRET KIN+CKTOT+MB+TROPI)
CK, MB: 1.1 ng/mL (ref 0.3–4.0)
Relative Index: 1.1 (ref 0.0–2.5)
Relative Index: INVALID (ref 0.0–2.5)
Troponin I: 0.32 ng/mL — ABNORMAL HIGH (ref 0.00–0.06)
Troponin I: 0.43 ng/mL — ABNORMAL HIGH (ref 0.00–0.06)
Troponin I: 0.05 ng/mL (ref 0.00–0.06)

## 2010-11-22 LAB — VANCOMYCIN, TROUGH: Vancomycin Tr: 19.9 ug/mL (ref 10.0–20.0)

## 2010-11-22 LAB — CREATININE, SERUM
Creatinine, Ser: 2.43 mg/dL — ABNORMAL HIGH (ref 0.4–1.5)
GFR calc non Af Amer: 27 mL/min — ABNORMAL LOW (ref >60)

## 2010-11-22 LAB — BRAIN NATRIURETIC PEPTIDE: Pro B Natriuretic peptide (BNP): 1403 pg/mL — ABNORMAL HIGH (ref 0.0–100.0)

## 2010-11-22 LAB — TSH: TSH: 1.267 u[IU]/mL (ref 0.350–4.500)

## 2010-12-25 NOTE — Discharge Summary (Signed)
NAME:  Warren [name], Warren [name] NO.:  [id number]   MEDICAL RECORD NO.:  [id number]          PATIENT TYPE:  INP   LOCATION:  A339                          FACILITY:  [facility]   PHYSICIAN:  Dorris Singh, DO    DATE OF BIRTH:  22-Feb-1946   DATE OF ADMISSION:  06/23/2008  DATE OF DISCHARGE:  11/17/2009LH                               DISCHARGE SUMMARY   ADMISSION DIAGNOSES:  Includes:  1. Hypertensive crisis.  2. Renal failure.  3. Bilateral wrist pain.  4. Elevated beta-natriuretic peptide.  5. Urinary retention.  6. History of cerebrovascular accident.   DISCHARGE DIAGNOSES:  Includes:  1. Malignant hypertension.  2. Renal failure, resolved.  3. Bilateral shoulder and wrist pain.  4. Left knee pain.  5. Urinary retention.  6. Elevated prostate-specific antigen of 6.15 and a elevated ESR.  7. Anemia.  8. Ibuprofen abuse.   Testing that was done includes on November 12, he had a portable chest x-  ray which demonstrates scar-like density in the right base stable since  2005, no active disease.  He had a CT of the head on November 12, which  demonstrated atrophy with small-vessel chronic ischemic changes of deep  cerebral white matter, probable tiny old left basilar ganglia lacunar  infarct, suspect bilateral acute cerebral infarct.  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  No acute osseous findings.  Possible foreign body in the left soft  tissue dorsal and ulnar to the left fifth metatarsal.  Correlate  clinically.  Three view of the wrist which showed the same thing.  Carotid Doppler, no evidence of significant plaque formation or  stenosis, minimally torturous carotid system with mildly turbinate flow  and areas of tuberosity, that was on the thirteenth.  He had a renal  ultrasound also on November 13, which demonstrated no acute renal  abnormalities and he had a complete knee which demonstrated minimal  joint  effusion without acute bony abnormality.  The patient also had an  echo done on November 13, which demonstrated overall left ventricular  systolic function was normal.  There were no left ventricular regional  wall motion abnormalities, left ventricular wall thickness was  moderately increased, left atrium was mildly dilated.   His H and P was done by Dorris Singh, DO, please refer for history of  presenting illness.   The patient's hospital course includes the patient was admitted for the  above diagnoses.  The plan was due to the patient's financial situation  and per his request to try to keep the tests to the minimal limit if  possible unless it was absolutely necessary.  1. Hypertensive crisis and malignant hypertension.  The patient was      initially admitted to the ICU for hypertensive crisis.  He was      started on nitro drip as well a labetalol drip.  He was eventually      switched to p.o. medications within 24 hours because his blood      pressure was under control.  During his stay though he  has had two      bouts of having a systolic pressure over 200, that lasted for a      couple hours and then we were able to bring it back down by adding      another blood pressure medication to his regiment.  This is why it      is called malignant hypertension at this point in time.  He is on      multiple medications to try to keep his blood pressure within      normal range.  2. For his acute renal failure the patient was started on IV      hydration.  Unsure of his current baseline.  However, he did admit      to abusing ibuprofen taking up to 1600 mg a day for several months      to deal with his shoulder and wrist pain.  The patient was told      that he should not take any type of NSAIDs and was not given any      while he was here.  He did have continual complaints of pain while      he was here regarding that.  IV hydration was continued and      instituted.  It brought  down his BUN and his creatinine to a      baseline of 2.19.  He will be recommended to follow up with      [name], M.D. at discharge.  Also for his malignant      hypertension [name], M.D. can see him for that as well      as [facility].  3. For his bilateral wrist pain, x-rays were done as mentioned above.      It was determined that he did have some mild arthritis.  However,      we would just do some narcotic pain medication to avoid any type of      NSAID.  4. His elevated BNP, he was given Lasix and this was resolved.  5. His urinary retention, we went and got a PSA on him.  At this point      in time, the patient had once asked about referring him to a      urologist.  He did not make up his mind while he was hospitalized,      but it is recommended upon discharge that he follow up with      urologist, particularly since the urinary retention could be caused      by BPH, but he needs to rule out with a high PSA that he has a      possibility of prostate cancer.  All of this has been discussed      with the patient extensively as to what his plan of action should      be.  6. History of CVA.  This was mentioned due to his findings on his MRI      and with his uncontrolled hypertension.  7. As documented in the progress note, the patient actually been      admitted to being very groggy due to pain medication and tried to      get up and fell.  Documented in the chart that he fell and he was      found on his on his back and not on his knee.  The patient states      that he  fell on his knees.  The knee was found to be swollen and      hot.  Orthopedic surgery was consulted, as well as x-rays obtained      and these were normal.  The patient did say that his knee is doing      better. We will continue to monitor.   For his hospital course, we have actually had the patient's social  services work with him.  We are able to provide him with the service   that fill most of his medications and to find out what will be the best  way for him to find some financial relief for this hospitalization.  The  patient does need extensive outpatient workup.  He needs to be followed  by Gerrit Friends. Dietrich Pates, MD, [facility] in the next couple  weeks and his discharge instructions are to follow up within 2, also by  PCP on-call.  We will set him up with the health department.  Also  [name], M.D. in about 2 weeks to check his kidney function.  The patient has been recommended follow up with Dr. Romeo Apple next week  or so and also a brace was ordered for him, but the patient refused.   His medications that he will be sent home on at discharge include:  1. Catapres 0.1 mg p.o. t.i.d. #90.  2. Prednisone 10 mg five tabs x2 days then four tabs x2 days then      three tabs x2 days then two tabs x2 days then one tab x2 days then      stop.  3. Omeprazole 20 mg one p.o. b.i.d. #60.  4. Hydralazine 50 mg one p.o. t.i.d. #90.  5. Percocet 5/325 one p.o. q.6h. p.r.n. #15.  6. Norvasc 10 mg one p.o. daily #30.  7. Labetalol 600 mg one p.o. b.i.d. #60.  8. Vasotec 5 mg one p.o. daily #30.   His disposition will be to home.   His condition is stable.   It has been discussed with the patient he needs extensive follow up for  his current conditions and also that he avoid all NSAID use to avoid the  possibility of having an ulcer due to his current abuse.      Dorris Singh, DO  Electronically Signed     CB/MEDQ  D:  06/28/2008  T:  06/28/2008  Job:  [id number]

## 2010-12-25 NOTE — Op Note (Signed)
NAME:  Warren [name], SOJA NO.:  [id number]   MEDICAL RECORD NO.:  [id number]          PATIENT TYPE:  INP   LOCATION:  2315                         FACILITY:  [facility]   PHYSICIAN:  Salvatore Decent. Dorris Fetch, M.D.DATE OF BIRTH:  Oct 23, 1945   DATE OF PROCEDURE:  10/21/2008  DATE OF DISCHARGE:                               OPERATIVE REPORT   PREOPERATIVE DIAGNOSIS:  Type 1 aortic dissection.   POSTOPERATIVE DIAGNOSIS:  Type 1 aortic dissection.   PROCEDURE:  Median sternotomy, extracorporeal circulation, replacement  of ascending aorta with aortic valve resuspension and reimplantation of  coronary arteries, deep hypothermic circulatory arrest, and retrograde  cerebroplegia.   SURGEON:  Salvatore Decent. Dorris Fetch, MD   ASSISTANT:  Cameron Proud.   ANESTHESIA:  General.   FINDINGS:  Type 1 aortic dissection. A tear just distal to the origin of  the right coronary artery.  Coronary arteries not involved.  Great  vessels perfused off true lumen.  Postbypass transesophageal  echocardiography revealed left ventricular hypertrophy, trace aortic  insufficiency, and trace mitral insufficiency.   CLINICAL NOTE:  [name] is a 65 year old gentleman, who presented to  the emergency room at [facility] with acute chest pain and  transthoracic echocardiogram suggested an aortic dissection.  The  patient was transported to [facility].  He underwent a CT angio, which  confirmed the presence of a type 1 aortic dissection.  The great vessels  were perfused off the true lumen.  There was no evidence of coronary  artery disease on the CT angio.  The patient was advised to undergo  emergent surgical repair.  The indications, risks, benefits, and  alternative treatments were discussed in detail with the patient.  He  understood the high-risk nature of the procedure.  He accepted the risk  and agreed to proceed.   OPERATIVE NOTE:  [name] was brought to the operating room on  October 21, 2008.  Anesthesia placed, arterial blood pressure monitoring  catheter and a Swan-Ganz catheter.  The patient was anesthetized and  intubated.  Intravenous antibiotics were administered.  A Foley catheter  was placed.  Transesophageal echocardiography was performed that  confirmed the type 1 dissection.  There was no significant pericardial  effusion and no significant aortic insufficiency.  The chest, abdomen,  and legs then were prepped and draped in usual fashion.  An incision was  made in the right groin and the right femoral artery was dissected out  at the bifurcation of the superficial femoral and profunda femoris, each  of these vessels were separately controlled.  The patient was given 5000  units of heparin and a 22-French femoral artery cannula was placed into  the common femoral artery.  A median sternotomy was performed.  The  remainder of the heparin was given.  Anticoagulation was monitored with  ACT measurement throughout the procedure.  The pericardium was opened.  There was minimal pericardial effusion.  There was no blood within the  pericardium.  There was obvious aortic dissection with extensive  hematoma around the root as well as into the fat pad around the  pulmonary artery.  The aorta was approximately 5-6 cm in diameter in  that area.  A dual-stage venous cannula was placed via pursestring  suture in the right atrium after confirming adequate anticoagulation  with ACT measurement.  Cardiopulmonary bypass was instituted and the  patient was immediately cooled.  The left ventricular vent was placed  via pursestring suture in the right superior pulmonary vein.  Retrograde  cardioplegic cannula was placed via pursestring suture in the right  atrium and directed into the coronary sinus.  The patient fibrillated as  he cooled.  A temperature probe was placed in myocardial septum and a  foam pad was placed in the pericardium to insulate the heart to protect   left ventricle.   The aorta was crossclamped.  The left ventricle was vented.  Cardiac  arrest was achieved with cold retrograde blood cardioplegia and topical  iced saline.  One liter of cardioplegia was administered.  The aorta  then was opened.  There was a large hematoma within the false lumen.  There was a tear just distal to the takeoff of the right coronary  artery, but did not involve the right coronary artery.  There was  extensive hematoma around the sinuses of Valsalva.  The dissected aorta  was removed.  There was lesser involvement of the noncoronary sinus of  Valsalva.  Buttons were preserved for the coronary ostial origins.  Remainder of the aorta was excised to just above the aortic valve  annulus.  The valve leaflets were then viable and essentially normal.  A  2-0 Ethibond horizontal mattress sutures with pledgets were utilized.  These were placed through the aortic annulus circumferentially.  Intermittent dose of cardioplegia were administered retrograde as well  as antegrade via the coronary ostia in 20 to 30-minute intervals  throughout the procedure.  At this point in time, the patient reached a  core temperature of 18 degrees Celsius.  Solu-Medrol and Pentothal were  administered.  The patient was placed in steep Trendelenburg position.  A cannula was placed into the superior vena cava.  The head was packed  in ice.  Full circulatory arrest was performed.  The patient was  exsanguinated.  Retrograde cerebral perfusion was initiated.  The aortic  crossclamp was removed and the aorta was transected just proximal to the  takeoff of the innominate artery.  There was hematoma of the arch was  inspected.  There was no compromise of the lumens of the great vessels.  There were no tears within the aortic arch.  The hematoma was evacuated  from the false lumen.  BioGlue was applied within the false lumen to  reapproximate the adventitia and intima.  Care was taken to make  sure  that the BioGlue entered the aortic lumen.  A ring of graft material  then was used to buttress the distal suture line.  A 30-mm Hemashield  graft was selected.  An end-to-end anastomosis was performed with a  running 4-0 Prolene sutures.  At the completion of anastomosis, the  anastomosis was inspected both internally and externally, additional  sutures were placed.  The retrograde cerebral perfusion was discontinued  and the pump was gradually restarted.  The total circulatory arrest time  was 36 minutes.  Crossclamp was placed on the graft.  There was  acceptable hemostasis at the distal suture line.  Incisions were made in  the proximal end of the graft after being cut to length at the level of  the sites for the  commissures.  A thermal cautery unit was used to  create a hole in the graft for anastomosis of the left main coronary  button.  This was performed with a running 5-0 Prolene suture.  A 4-0  Prolene pledgeted sutures then were used to resuspend the aortic valve.  These were placed through the graft.  The remaining Ethibond sutures  then were placed to the graft and sequentially tied.  Systemic rewarming  had been initiated after completion of the circulatory arrest.  A hole  was created in the graft and the right coronary button was anastomosed  to the graft with a running 5-0 Prolene suture.  After de-airing the  aortic graft, lidocaine was administered, a warm dose of retrograde  cardioplegia was administered and the aortic crossclamp was removed.  The patient initially fibrillated and required a single defibrillation  with 20 joules.  There was initially distention of the heart with  removal of the crossclamp, this subsequently improved.  All anastomoses  were inspected for hemostasis and 4-0 Prolene pledgeted sutures were  utilized where necessary.  There was typical amount of bleeding for this  type of procedure.  A dopamine infusion at 3 mcg/kg per minute had  been  initiated at the beginning of the procedure due to the patient's renal  insufficiency.  This was continued throughout the bypass run and during  weaning.  Additional de-airing was performed with a IV catheter through  the apex of the left ventricle.  When the patient rewarmed to a core  temperature of 37 degrees Celsius, epicardial pacing wires were placed  on the right ventricle and right atrium.  Initial trial wean was  performed to evaluate the aortic valve.  The patient weaned from bypass  quite easily and the bypass was discontinued, but no protamine was  given.  Echocardiographic images were suboptimal, but it was clear there  was aortic insufficiency despite the patient remained hemodynamically  stable and without significant elevation of his pulmonary arterial  pressures.  It was unclear exactly what was the cause of this aortic  insufficiency.  The decision was made to go back on full bypass and to  reassess the aortic valve.  Full bypass was reinstituted.  The graft was  divided.  The aortic valve was inspected and the left coronary cusp was  prolapsed and was not adequately resuspended.  Additional 4-0 Ethibond  pledgeted suture was utilized to resuspend this commissure, so that the  valve leaflets were at the same height.  The graft-to-graft anastomoses  then was performed with a running 4-0 Prolene suture.  Once again, the  graft was de-aired prior to removing the crossclamp from the graft.  At  this point, the left ventricular vent was removed.  The retrograde  cardioplegic cannulas removed.  When the patient was at 37 degrees  Celsius, once again he was weaned from cardiopulmonary bypass on the  first attempt and total crossclamp time for the entire procedure was 196  minutes.  The total bypass time was 240 minutes.  The patient had good  hemodynamic stability.  There was bleeding as expected.  Postbypass  transesophageal echocardiography revealed preserved left  ventricular  function, marked left ventricular hypertrophy.  There was trace aortic  insufficiency and trace mitral insufficiency.  All three leaflets of the  valve were visualized and were moving adequately.  A test dose of  protamine was administered and was well tolerated.  The femoral arterial  cannula was removed and the femoral  arteriotomy was repaired with a 6-0  running Prolene suture.  There was a good pulse in both the profunda  femoris and superficial femoral after the repair.  The atrial cannula  was removed.  The remainder of protamine was administered without  incident.  Chest was irrigated with warm saline.  Hemostasis was  achieved.  FloSeal and Surgicel were applied around all of the  anastomoses.  There was again the expected bleeding.  Thrombocytopenia  was treated with platelet transfusion.  Coagulopathy was treated with  fresh frozen plasma.  The pericardium was reapproximated with  interrupted 3-0 silk sutures.  Two mediastinal chest tubes were placed  through separate subcostal incisions.  The sternum was closed with a  combination of single and double heavy gauge stainless steel wires.  Pectoralis fascia, subcutaneous tissue, and skin were closed in standard  fashion.  The sponge and instrument counts were correct at the end of  the procedure.  There were some incorrect needle count, this will be  evaluated with a chest x-ray in the surgical intensive care unit.  The  patient was transported from the operating room to the surgical  intensive care unit in critical condition.      Salvatore Decent Dorris Fetch, M.D.  Electronically Signed     SCH/MEDQ  D:  10/22/2008  T:  10/22/2008  Job:  [id number]   cc:   Madolyn Frieze. Jens Som, MD, [facility]

## 2010-12-25 NOTE — H&P (Signed)
NAME:  NATURE, VOGELSANG NO.:  [id number]   MEDICAL RECORD NO.:  [id number]          PATIENT TYPE:  INP   LOCATION:  IC06                          FACILITY:  [facility]   PHYSICIAN:  Dorris Singh, DO    DATE OF BIRTH:  1946-06-30   DATE OF ADMISSION:  06/23/2008  DATE OF DISCHARGE:  LH                              HISTORY & PHYSICAL   The patient is a 65 year old Caucasian male who presented to [facility]  emergency room due to several complaints.  One, he was complaining of  headache and also for this arthritic pain he has been experiencing.  While he was here, they were doing initial vitals.  He was found to have  a blood pressure of 250/180.  At this point in time, he was determined  to be in hypertensive crisis.  He is asymptomatic and states that he has  had a headache that has been prolonged and this might be secondary to  his elevated BP.  In my interview with the patient, he also admits to  several problems.  He states that he does not go to the doctor at all  and has had some concerns about how he has been feeling lately.  One  concern has been his blood pressure.  Two, this bilateral wrist pain  that he had been having that he cannot function.  It takes him time to  actually get functional in the morning.  He has noticed that he has been  having more urinary retention and he cannot go as well as he used to.  So, he is asking these things to be looked at while he is here.   PAST MEDICAL HISTORY:  Significant for multiple back surgeries on L4-L5.   SOCIAL HISTORY:  He is a nonsmoker, occasional drinker.  He denies any  drug use.  He lives alone.  He admits to having a career in the [employer]  and obtaining his master's in psychology.   ALLERGIES:  NONE.   MEDICATIONS:  He currently takes Ibuprofen 1600 as needed.   REVIEW OF SYSTEMS:  CONSTITUTIONAL:  He has had a decrease in appetite  and increasing fatigue.  EYES:  No changes in vision.  EARS, NOSE,  MOUTH  AND THROAT:  All negative.  HEART:  Negative palpitations or chest pain.  LUNGS:  Clear to auscultation bilaterally.  GI:  Positive hesitancy,  retention and frequency.  GU:  No nausea, vomiting, diarrhea or  constipation.  MUSCULOSKELETAL:  Positive arthralgias.  HEMATOLOGIC:  Negative.  PSYCHIATRIC:  Negative.   PHYSICAL EXAMINATION:  VITAL SIGNS:  Blood pressure is 199/132, pulse  rate 80 and respirations 20.  His pulse ox is 96%.  GENERAL:  The  patient is a well-developed, well-nourished Caucasian male who is  appropriate with questions.  HEENT:  Head is normocephalic, atraumatic.  Eyes are PERRLA, EOMI.  Ears:  PMI is visualized bilaterally.  Nose:  Turbinates moist.  Teeth  are in poor repair.  Throat:  There is no erythema or exudate.  NECK:  Supple.  No lymphadenopathy noted.  CARDIOVASCULAR:  Regular  rate and rhythm.  No murmur, rub or gallop.  RESPIRATORY:  Clear to auscultation bilaterally.  No wheezes, rales or  rhonchi.  ABDOMEN:  Soft, nontender, nondistended.  No organomegaly noted.  EXTREMITIES:  Positive tenderness with range of motion for bilateral  shoulders and bilateral wrists. NEURO:  Cranial nerves II-XII grossly  intact.  Good sensation.  Good motor.  SKIN:  Good turgor, good texture.  NEUROLOGIC:  Alert and oriented x3.   His testing that was done:  He had an EKG which was essentially normal.  He had a CT of the head without contrast which showed atrophy with small-  vessel chronic ischemic changes with deep cerebral white matter,  probable tiny left basal ganglia infarct, suspect bilateral acute  cerebral infarct.  His BNP was 287, which is elevated.  His sodium was  138, potassium 3.5.  Chloride was 101, carbon dioxide 24, glucose 122,  BUN 32, creatinine 2.53, calcium 9.6, total protein 7.5, albumin 3.8,  AST 32, ALT 48, alk phos 287.  His alcohol level is less than 5 and his  INR is 0.9.  White count is 12.6, hemoglobin 16.3, hematocrit 46.9,   platelet count of 286.  He had a CT of the chest which showed scar like  density at the right base, stable since 2005.  No active disease.   ASSESSMENT:  1. Hypertensive crisis.  2. Acute renal failure.  3. Bilateral wrist pain.  4. Elevated beta natriuretic peptide.  5. Urinary retention.  6. History of cerebrovascular accident.   PLAN:  1. Admit the patient to the ICU for his hypertensive crisis.  Will go      ahead and put him on a nitro drip, as well as a labetalol drip.      During this timeframe, we will try to transfer him over to p.o.      medications and continue to monitor his progress.  2. For acute renal failure will start the patient on IV hydration,      will continue to monitor and see if it trends down.  Discussed with      the patient that due to his blood pressure being so elevated, this      could be a consequence of his uncontrolled blood pressure for      years.  3. Bilateral wrist pain.  Due to the patient's history, he states that      this is one of the reasons why he came to be investigated day.      Will go ahead and do a bilateral wrist and hand x-ray to check for      osteoarthritis, as well as rheumatoid arthritis based on the      patient's history.  Will also do an RS factor and an ANA.  4. Elevated BNP.  This could also be a consequent of the patient's      uncontrolled hypertension.  We will go ahead and order a 2-D echo      and request that [facility] see him while he is here.  5. Urinary retention.  Will go ahead and order a PSA on him and also      UA with culture and sensitivity.  As mentioned, the patient      depending on how we control his medications, we might be able to      use some medications that can affect his prostate to help with his      issue pending testing.  6. History of CVA.  Currently, the patient has what appears to be old      infarcts.  Will not do an MRI at this point in time.  If he      continues to show any  deficit, we will continue an MRI, but right      now the patient is alert and oriented without any deficits.  7. Plan also is to do a carotid Doppler and a 2-D echo.  Will put him      on DVT and GI prophylaxis and will continue to monitor him and make      any changes as necessary.      Dorris Singh, DO  Electronically Signed     CB/MEDQ  D:  06/23/2008  T:  06/23/2008  Job:  [id number]

## 2010-12-25 NOTE — Assessment & Plan Note (Signed)
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  He underwent emergent repair of  that on October 21, 2008.  His postoperative course was complicated by  delirium, possible withdrawal, although he denies any alcohol use prior  to coming into the hospital for at least a couple of years.  He had  severe deconditioning, but eventually it improved to the point where he  can be discharged to skilled nursing facility after 18 days.  He has now  been there for approximately 2-1/2 weeks.  He states that he has been  working with physical therapy everyday, but he refused therapy on 1 day.  He says he is not getting enough therapy.  He wish he was getting more.  He is not having any pain.  He has not had any shortness of breath.  He  is anxious to get home.  He does complain of frequent bowel movements.  He has been getting a lot of stool softeners.   His current medications are Flomax 0.4 mg daily, Xanax 0.5 mg t.i.d.  p.r.n., aspirin 325 mg daily, labetalol 400 mg b.i.d., clonidine patch  0.3 mg q.24 h., Norvasc 10 mg daily, Nu-Iron, and oxycodone 5 mg daily.  He is also taking multivitamin.   PHYSICAL EXAMINATION:  GENERAL:  The patient is a well-appearing 62-year-  old white male in no acute distress.  He looks much better than he did  when he left the hospital.  VITAL SIGNS:  Blood pressure 132/68, pulse 95, respirations 18, and his  oxygen saturation is 96% on room air.  LUNGS:  Clear with equal breath sounds bilaterally.  CARDIAC:  Regular rate and rhythm.  Normal S1 and S2.  There is no  murmur.  ABDOMEN:  Soft and nontender.  EXTREMITIES:  Without clubbing, cyanosis, or edema.   Chest x-ray shows good aeration of the lungs bilaterally.   IMPRESSION:  The patient is a  65 year old gentleman, status post repair  of a type-1 dissection with an ascending graft.  He had a long and  complicated postoperative course, which is normal for that type of  surgery, but now looks great.  I think he probably should be ready to go  home within a week or so.  His daughter says she will be coming to visit  in a week, that will be a good time for him to be discharged.  She is  going to be staying in town about 2 weeks with him.  He should continue  physical therapy.  He wants to discontinue Dulcolax, that is fine with  me, although he promised to get one Fleet Enema just to get things  cleared out before the Dulcolax stop.  He can also stop the Nu-Iron.  He  apparently has not been receiving the amiodarone.  I think that can be  discontinued at this point in time.  I will plan to see him back in  about 5 months just around 6 months from his original operation.  We  will do a CT angio of the chest to assess the arch and remainder  of the  aorta.   Warren [name], M.D.  Electronically Signed   SCH/MEDQ  D:  11/25/2008  T:  11/26/2008  Job:  [id number]   cc:   Pricilla Riffle, MD, [facility]

## 2010-12-25 NOTE — Group Therapy Note (Signed)
NAME:  Warren [name], Warren [name] NO.:  [id number]   MEDICAL RECORD NO.:  [id number]          PATIENT TYPE:  INP   LOCATION:  A339                          FACILITY:  [facility]   PHYSICIAN:  Dorris Singh, DO    DATE OF BIRTH:  [birth year]   DATE OF PROCEDURE:  06/27/2008  DATE OF DISCHARGE:                                 PROGRESS NOTE   The patient seen today.  I had visited the patient twice today, once he  was seen by myself and two, he was seen with a close friend who he note  it was okay to speak with him.  On first visit with the patient  discussed his pain concerns, particularly his shoulders and his wrists  and I explained to him that his kidney function was improving.  However,  we need to avoid all types of nonsteroidal antiinflammatories.  Also he  had a bout of elevated blood pressure.  Both cardiology has see him, as  well as near nephrology.  Nephrology has recommended in addition to this  medication.  I also talked with the patient yesterday about seeing  social services and I have gone over his case with social services to  talk to him to see what type of options are available to him.  Concerning to me today is that the patient had mentioned on his first  day of admission that he had fallen however, only due to being loopy  with medication did not indicate any problems associated with that.  I  have seen him every day, sometimes twice a day for talking to the  patient regarding his care, trying to ease his concerns about not having  any type of pain or noninflammatory pain medication due to his renal  failure and the patient has never mentioned to me regarding any problems  with his knee.  It was mentioned by the nursing supervisor that the  patient had fallen and was complaining of left knee pain and I have seen  the patient and at least have had two the patient contacts with him  every day since admission.  I went to go see him, this is my second  visit today  as mentioned above and brought the social worker with me.  I  examined the need.  It is hot, it is red and it is swollen.  Now he  states that this knee was yes bothering him, but no worse than all of  his other aches and pains and I explained to him that this was  concerning because it could have been due to his fall if he states that  he fell on that knee.  Per report though, the patient was found sitting  down so there was no indication that he had actually fallen on knee.  We  will go ahead and order a x-ray of that knee and order a uric acid level  because it was mentioned as well that he does have a history of gout,  but has not had any problems with it since.  We will go ahead and  continue  to monitor him and see what kind of recommendations that Social  Services can come up with regarding his future care because he will need  a care and follow-up appointment for his hypertension, as well as his  prostate issues an elevated PSA.   VITALS:  98.1, pulse 93, respirations 20, blood pressure 185/100.  GENERALLY:  Alert and oriented, well-developed, well-nourished.  HEART:  Regular rate and rhythm.  LUNGS:  Clear to auscultation bilaterally.  ABDOMEN:  Soft, nontender, and nondistended.  EXTREMITIES:  Left knee positive swelling, red to the touch.  Right  extremity positive pulses.   LABS FOR TODAY ARE AS FOLLOWS:  We are still pending a rheumatoid  factor, as well as an ANA.  His white count is 8.3, hemoglobin 11.4,  hematocrit 33.3, platelet count 232.  Sodium 137, potassium 4, chloride  106, CO2 of 25, glucose 103, BUN 22, creatinine 2.11.  His BUN has  returned to baseline however, his creatinine has not.  His 24-hour urine  had a total volume of 2150 collected and it has been 24 hours and his  urinary creatinine is 80.5.  His creatinine is 2.19.  His creatinine  over 24 hours was 17.21 and creatinine clearance was 55.  So, his  urinary protein was 839.   ASSESSMENT/PLAN:  1.  Hypertensive crisis.  The patient is still having bouts of elevated      hypertension regardless of medication as it is resistant too      possibly renal artery stenosis versus just malignant hypertension.      This will have to be addressed by nephrology and cardiology to see      if they have any other suggestions.  2. Acute renal failure.  It seems to be normalizing slowly.  His BUN      is normal however, his creatinine is still elevated.  3. Elevated prostate-specific antigen.  I have asked the patient as to      whether not if he wants an inpatient or outpatient referral for      urology.  He has not made up his mind at this point in time.  4. Swollen left knee.  We will go ahead and get an x-ray to rule out      inflammatory process versus trauma.  5. We also have social services working on his case to see if there is      any type of assistance we can offer him.      Dorris Singh, DO  Electronically Signed     CB/MEDQ  D:  06/27/2008  T:  06/27/2008  Job:  [id number]

## 2010-12-25 NOTE — Consult Note (Signed)
NAME:  Warren [name], Warren [name] NO.:  [id number]   MEDICAL RECORD NO.:  [id number]          PATIENT TYPE:  INP   LOCATION:  IC06                          FACILITY:  [facility]   PHYSICIAN:  Gerrit Friends. Dietrich Pates, MD, FACCDATE OF BIRTH:  July 15, 1946   DATE OF CONSULTATION:  06/24/2008  DATE OF DISCHARGE:                                 CONSULTATION   REFERRING PHYSICIAN:  Dorris Singh, DO of [facility]  P.   PRIMARY CARE PHYSICIAN:  None.   CARDIOLOGIST:  Gerrit Friends. Dietrich Pates, MD, [facility]   REASON FOR CONSULTATION:  Hypertension and elevated BNP.   HISTORY OF PRESENT ILLNESS:  Warren [name] is a 65 year old male patient  with apparently undiagnosed hypertension who noted a phase of not  feeling well for the last several days and decided to come to the  emergency room at [facility].  He notes symptoms of  polyarthralgias for the last several days as well.  He notes that he has  been taking high doses of ibuprofen at home several times a day over the  last several days.  In the emergency room at [facility] his blood  pressure was noted to be at 258/180.  He was admitted for further  treatment.  Head CT was notable for an old left basal ganglia lacunar  infarct and possible bilateral acute cerebellar infarcts.  He was placed  on nitroglycerin drip as well as labetalol drip.  He has also been  placed on amlodipine p.o.  His blood pressure is much better this  morning.  He did have a nondescript occipital headache with all this.  Otherwise he denies any history of chest pain, shortness of breath,  orthopnea, PND or pedal edema.  He denies any history of cough, syncope  or palpitations.  His chest x-ray is negative for edema.  His BNP is  slightly elevated and we are asked to further evaluate.  Of note, his  creatinine is elevated to 0.6.  Last recorded creatinine was 1 in July  of 2004.   PAST MEDICAL HISTORY:  Status post lumbar spine surgery.   MEDICATIONS AT  HOME:  Ibuprofen p.r.n.   ALLERGIES:  No known drug allergies.   SOCIAL HISTORY:  The patient lives in [place] by himself.  He is estranged  from his 2 children.  He quit smoking about 30 years ago.  Denies  alcohol or drug abuse.  He is retired from [employer] work.   FAMILY HISTORY:  Is significant for premature CAD.   REVIEW OF SYSTEMS:  Please see HPI.  He denies any fevers, chills,  dysuria, hematuria, bright red blood per rectum or melena, dysphagia or  weight change.  He denies any unilateral weakness, difficulty with  speech.  Rest of the review of systems are negative.   PHYSICAL EXAM:  GENERAL:  He is well-nourished and well-developed.  VITAL SIGNS:  Blood pressure 143/75, pulse 67, respirations 15,  temperature 97.1, oxygen saturation 92% on room air.  Weight is 94.9  kilograms.  Ins and outs 1555 in and 200 out.  HEENT:  Normal.  NECK:  Without JVD.  LYMPHS:  Without lymphadenopathy.  ENDOCRINE:  Without thyromegaly.  CARDIAC:  Normal S1, S2.  Regular rate and rhythm without murmur.  LUNGS:  Clear to auscultation bilaterally without wheezing.  SKIN:  Without rash.  ABDOMEN:  Soft, nontender with normoactive bowel sounds and without  organomegaly.  EXTREMITIES:  Without clubbing, cyanosis or edema.  MUSCULOSKELETAL:  Without joint deformity.  NEUROLOGIC:  He is alert and oriented x3.  Cranial nerves II-XII grossly  intact.  VASCULAR:  Exam without carotid bruits bilaterally.  Dorsalis pedis and  posterior tibialis pulses are 2+ bilaterally.   Head CT as outlined above.  Chest x-ray scar like density in the right  base stable since 2005.  No active disease.  EKG sinus rhythm, heart  rate 66, inferior T waves, T wave inversions 1, 2, 3, aVF V2 through V6.  LVH with probable strain, normal axis.   LABS:  White count 9500, hemoglobin 12.5, hematocrit 36.1, platelet  count 285,000, sodium 137, potassium 3.5, BUN 36, creatinine 2.76,  glucose 157, alkaline phosphatase 287,  AST 32, ALT 48, albumin 2.9, BNP  287.  This is down to 143 today.  CK-MB negative x2.  Troponin-I 0.1,  0.07.  INR 1.1, magnesium 2.2.  Urinalysis significant for greater than  300 mg/dL protein.   ASSESSMENT AND PLAN:  1. Accelerated hypertension with probable hypertensive crisis.  The      patient has likely had uncontrolled hypertension undiagnosed for      quite some time.  It is possible that his elevated use of NSAIDs      recently has exacerbated his hypertension.  His blood pressures are      much better controlled now on his current therapy.  We will      discontinue his IV nitroglycerin and add p.o. labetalol.  Clonidine      can certainly be added if necessary.  2. Elevated BNP.  This is likely related to strain from his elevated      blood pressure.  No symptoms of congestive heart failure are noted      and his chest x-ray is without edema.  Echocardiogram is currently      pending.  3. Renal failure.  This is likely secondary to hypertensive      nephrosclerosis and possibly exacerbated by NSAID use.  IV      hydration will be discontinued at this may exacerbate his      hypertension.  Nephrology evaluation and renal ultrasound could      certainly be considered.  This will be per the primary service.  4. Arthralgias.  Current workup is currently pending per the primary      service.   DISPOSITION:  The patient was also interviewed by Dr. Dietrich Pates.  Thank  you very much for the consultation.  We will be glad to follow the  patient throughout the remainder of this admission.      Tereso Newcomer, PA-C      Gerrit Friends. Dietrich Pates, MD, [facility]  Electronically Signed   SW/MEDQ  D:  06/24/2008  T:  06/24/2008  Job:  [id number]

## 2010-12-25 NOTE — Group Therapy Note (Signed)
NAME:  Warren [name], Warren [name] NO.:  [id number]   MEDICAL RECORD NO.:  [id number]          PATIENT TYPE:  INP   LOCATION:  A339                          FACILITY:  [facility]   PHYSICIAN:  Dorris Singh, DO    DATE OF BIRTH:  08/19/1945   DATE OF PROCEDURE:  06/26/2008  DATE OF DISCHARGE:                                 PROGRESS NOTE   The patient was seen today.  Overnight he had another episode of a  hypertensive crisis.  We are unable to get his blood pressure under  better control.  He was started on a Catapres patch.  He was also given  some IV antihypertensives which did not help.  I discussed with the  patient the need for him to continue to stay in the hospital today.  He  wanted to leave.  I explained to him that you know because he has no  insurance that there are still some questions that needed to be answered  particularly getting his kidney function back to baseline as well as his  elevated PSA and now not being able to control his blood pressure once  again.   The patient agreed to stay one more day.  I told him we would have  social services come and talk to him and address his concerns about his  financial situation.  I basically explained to him that he is a very  sick individual and had he not come in he probably would have had some  other disease states due to his preexisting conditions.   PHYSICAL EXAMINATION:  VITAL SIGNS:  His current vitals 98.1, heart rate  82, respirations 20.  He has had several blood pressures, one was  194/112, the other was 199/107, and after the Catapres patch it was  152/91.  HEART:  Regular rate and rhythm.  LUNGS:  Clear to auscultation bilaterally.  ABDOMEN:  Soft, nontender and nondistended.  EXTREMITIES:  Positive pulses.  No edema, ecchymosis or cyanosis.   His CBC - white count 9.2, hemoglobin 12.1, hematocrit 36.4, platelet  count 266.  Sodium is 140.  His potassium is 3.4, chloride is 109, CO2  24, glucose 106, BUN  24 and creatinine 2.19.   ASSESSMENT AND PLAN:  1. Hypertensive crisis.  The patient is still having episodes of this.      We thought we had actually brought his pressure down put him on a      0.3 Catapres patch and we are seeing a change in his pressure.  We      will continue to monitor this.  2. Renal failure.  He is still in the middle of a collection of a 24      hour urine which will be completed at 9:00 tonight.  Also, his      renal function has improved somewhat but I do not think we are at      baseline with him at this point in time.  3. Elevated PSA (prostate specific antigen).  The patient has not made      a decision on whether or  not he wants [place] to consult neurology      regarding this elevated PSA.  The patient has agreed to stay one      more night in hopes that      Social Services might be able to help him find some solutions for      payment.  I explained to him that this is not a guarantee but at      least this may help him make some better choices as far as what      treatment he would need to obtain for his above disease state.      Dorris Singh, DO  Electronically Signed     CB/MEDQ  D:  06/26/2008  T:  06/26/2008  Job:  [id number]

## 2010-12-25 NOTE — Group Therapy Note (Signed)
NAME:  Warren [name], Warren [name] NO.:  [id number]   MEDICAL RECORD NO.:  [id number]          PATIENT TYPE:  INP   LOCATION:  A339                          FACILITY:  [facility]   PHYSICIAN:  Dorris Singh, DO    DATE OF BIRTH:  02/15/1946   DATE OF PROCEDURE:  06/25/2008  DATE OF DISCHARGE:                                 PROGRESS NOTE   The patient is seen today doing better.  Reviewed all testing with him.  His echo showed left atrial enlargement.  Other than that all his valves  were within normal limits.  Carotid Doppler was negative as well as his  renal ultrasound.  He is still in acute renal failure and is being  followed by Dr. Kristian Covey.  They have recommended that he do a 24 hour  urine to see where he stands.  The patient is still complaining of  extreme joint pain.  However, I explained to him due to his NSAID abuse  that he needs to avoid NSAIDs at all costs.   PHYSICAL EXAMINATION:  VITAL SIGNS:  His temperature is 97.6, pulse 107,  respirations 20, blood pressure 123/71.  GENERAL:  The patient is well-developed, well-nourished and in no acute  distress.  HEART:  Regular rate and rhythm.  LUNGS:  Clear to auscultation bilaterally.  ABDOMEN:  Soft, nontender, nondistended.  MUSCULOSKELETAL:  Positives, soreness and tenderness at the shoulder and  wrist joints.  Gu: rectal examine enlarged prostate plus 1.  hemmocult negative.   LABS:  For today white count 7.4, hemoglobin 12.1, hematocrit 35.3,  platelet count of 231, sodium is 138, potassium 3.3, chloride 110, CO2  24, glucose 105, BUN 29, creatinine 2.50.  His PSA came back and it is  6.15.   ASSESSMENT AND PLAN:  1. Hypertensive crisis.  Blood pressure has been under control.  We      have seen an improvement with the current medications.  2. Acute renal failure.  This is also improving with IV hydration and      the absence of NSAID use.  3. Heartburn.  This has also improved since he has been getting  Maalox      and I upped his proton pump inhibitor to b.i.d.  4. Elevated PSA (prostate specific antigen).  The patient is having      symptoms of urinary retention.  Will add Hytrin to his regimen and      also recommend that he follow up outpatient for any kind of      additional testing and we will give him the name of a urologist.      discussed  possiblity of prostate cancer and I have recommended      urology consult in hospital.  Pt would like to discuss with friends      regarding next course of action.  Await patients descion on how he      would like to f/u this abnormal lab value.  5. Acute renal failure.  They will do a 24 hour urine on the patient      as well.  We will continue to monitor him and      anticipate discharge in the next 24-48 hours.  6. chronic pain- have give risk vs. benifit for use of NSAIDS for pain      regarding failing kidneys.  Pt stated understanding.  Can not use      them as long as he is in kidney failure and has abused them in the      past.      Dorris Singh, DO  Electronically Signed     CB/MEDQ  D:  06/25/2008  T:  06/25/2008  Job:  [id number]

## 2010-12-25 NOTE — Discharge Summary (Signed)
NAME:  Warren [name], Warren [name] NO.:  [id number]   MEDICAL RECORD NO.:  [id number]          PATIENT TYPE:  INP   LOCATION:  2001                         FACILITY:  [facility]   PHYSICIAN:  Salvatore Decent. Dorris Fetch, M.D.DATE OF BIRTH:  [birth year]   DATE OF ADMISSION:  10/21/2008  DATE OF DISCHARGE:                               DISCHARGE SUMMARY   ADDENDUM   ADMITTING DIAGNOSIS:  Same as previously dictated.   DISCHARGE DIAGNOSIS:  Same as previously dictated in addition to urinary  retention.   BRIEF HOSPITAL COURSE STAY SINCE LAST DICTATION:  The patient remained  afebrile.  Blood pressure remained fairly well controlled.  The patient  remained in normal sinus rhythm.  The patient did fall the evening of  November 06, 2008, while trying to ambulate to the bathroom.  Vital signs  remained stable.  His neurological exam was nonfocal.  He apparently did  hit his head, elbow, knees, and left hand.  Had complaints of left hand  pain at the base of the palm as well as a small superficial right  forehead hematoma.  A CT of the head was obtained.  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  No evidence of acute intracranial hemorrhage or acute  infarction.  It also showed stable left maxillary sinuses and adjacent  periodontal disease.  Because the patient had complaints of left knee  pain after the fall, x-ray which was obtained, this was negative for  fracture or dislocation, effusion, or any acute abnormality.  The  patient's H and H again decreased to 7.5 and 21.9 on November 07, 2008.  He  received 2 units of packed red blood cells.  Currently, his H and H is  8.9 and 26.  Creatinine was remaining around 2.54.  He was unable to  void and had to have the Foley replaced.  Flomax was initiated.  Foley  will be removed in the next day.  Currently, November 08, 2008, the patient  is afebrile, heart rate is 67, BP is 130/64, O2 sat 92-93% on room  air.  The patient has complaints of decreased taste and pain in the tongue and  throat.  He is being treated for thrush with Diflucan and Magic  Mouthwash.  He also has complaints of difficulty speaking.  He denies  any abdominal pain, nauseousness, vomiting, or dysphasia.   PHYSICAL EXAMINATION:  CARDIOVASCULAR:  Regular rate and rhythm.  Systolic murmur.  PULMONARY:  Decreased at the right base.  ABDOMEN:  Soft, nontender.  Bowel sounds present.  EXTREMITIES:  No lower extremity edema.  Wound is clean and dry.  NEUROLOGIC:  Grossly intact without any focal deficits.   Chest x-ray obtained a previous day did show an increase in the right  pleural effusion with compression atelectasis.  We will monitor this  closely as the patient may need a thoracentesis.  The patient will  continue with PT and Cardiac Rehab.  We will also obtain a speech  pathology consult.  Provided the patient remains afebrile and  hemodynamically stable, he will be discharged on November 09, 2008.  LATEST LABORATORY STUDIES:  BMET done on November 08, 2008, potassium 4.1,  BUN and creatinine were 20 and 2.54 respectively.  CBC done H and H 8.9  and 26 respectively, white count of 300, platelet count of 261,000.      Doree Fudge, PA      Viviann Spare C. Dorris Fetch, M.D.  Electronically Signed    DZ/MEDQ  D:  11/08/2008  T:  11/09/2008  Job:  [id number]

## 2010-12-25 NOTE — Discharge Summary (Signed)
NAME:  Warren [name], Warren [name] NO.:  [id number]   MEDICAL RECORD NO.:  [id number]          PATIENT TYPE:  INP   LOCATION:  2006                         FACILITY:  [facility]   PHYSICIAN:  Salvatore Decent. Dorris Fetch, M.D.DATE OF BIRTH:  Aug 25, 1945   DATE OF ADMISSION:  02/10/2009  DATE OF DISCHARGE:  02/12/2009                               DISCHARGE SUMMARY   PRIMARY ADMITTING DIAGNOSIS:  Shoulder pain.   ADDITIONAL/DISCHARGE DIAGNOSES:  1. 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 Dorris Fetch.  2. Hypertension, poorly controlled.  3. Remote history of cerebrovascular accident.  4. History of ethyl alcohol abuse.  5. Chronic renal insufficiency with baseline creatinine around 1.9.  6. History of atrial fibrillation postoperatively, now in sinus      rhythm.  7. Benign prostatic hyperplasia.  8. Urinary retention.   HISTORY:  The patient is a 65 year old male who underwent emergent  repair of a type 1 aortic dissection by Dr. Dorris Fetch in March 2010.  On the date of this admission, he presented to the emergency department  at [facility] complaining of progressive shoulder pain with  dizziness and diaphoresis.  He underwent an MRI without contrast, which  showed enhanced contrast material around the graft consistent with a  possible hematoma neither fresh or old.  There was no pericardial or  pleural effusion.  His blood pressure on presentation at [facility] was 160 systolic.  He was treated with some pain medication and  transferred to [facility] for further cardiac surgery  evaluation.   HOSPITAL COURSE:  The patient was transferred to [facility] and  underwent a transthoracic echocardiogram, which was performed in the  emergency department by Dr. Gala Romney.  This showed that the aortic  valve was not dehisced, but it did show a moderate amount of aortic  insufficiency, 2 to 3+.  There was some hematoma surrounding  the graft  above the annulus.  There was no flow through this material by Doppler.  There also was no pericardial effusion and left ventricular function was  fairly well preserved.  Dr. Donata Clay saw the patient and reviewed his  MRI with the [facility] radiologist, Dr. Fredia Sorrow.  It was not felt that he  had evidence of an active leak at his graft and that the pain was most  likely musculoskeletal in etiology.  His blood pressure did remain  somewhat elevated requiring titration of his blood pressure medications.  He also had some urinary retention requiring placement of a Foley  catheter.  Flomax was subsequently started, and since that time, his  Foley has been discontinued and he is voiding without problem.  At this  point, his blood pressure is well controlled.  His pain has remained  controlled with pain medications.  His labs on hospital day #2 show  hemoglobin of 11.7, hematocrit 34.5, platelets 227, white count 7.8,  sodium 139, potassium 3.7, BUN 25, creatinine 2.16.  His chest x-ray is  stable with no acute changes.  It is felt that since his blood pressure  is better controlled and he is feeling better that at this point he may  be discharged home to follow up as an outpatient in our office.   DISCHARGE MEDICATIONS:  1. Labetalol 400 mg b.i.d.  2. Norvasc 5 mg daily.  3. Flomax 0.4 mg daily.  4. Ultram 50-100 mg q.4 h. p.r.n. for pain.   DISCHARGE INSTRUCTIONS:  He is asked to continue a low-fat, low-sodium  diet.  He may resume his activity as tolerated.   DISCHARGE FOLLOWUP:  He will be contacted with an appointment to see Dr.  Dorris Fetch in 1 week.  If he experiences any problems in the interim,  he is asked to contact our office.      Warren [name], P.A.      Salvatore Decent Dorris Fetch, M.D.  Electronically Signed    GC/MEDQ  D:  02/12/2009  T:  02/12/2009  Job:  [id number]   cc:   [facility] Office  Pricilla Riffle, MD, [facility]

## 2010-12-25 NOTE — Discharge Summary (Signed)
NAME:  Warren [name], Warren [name] NO.:  [id number]   MEDICAL RECORD NO.:  [id number]          PATIENT TYPE:  INP   LOCATION:  2001                         FACILITY:  [facility]   PHYSICIAN:  Salvatore Decent. Dorris [name], M.D.DATE OF BIRTH:  1946/05/11   DATE OF ADMISSION:  10/21/2008  DATE OF DISCHARGE:  11/14/2008                               DISCHARGE SUMMARY   ADDENDUM:   HOSPITAL COURSE STAY SINCE LAST DICTATION:  The patient as previously  stated was being treated for thrush with Diflucan and Magic mouth wash.  Diflucan has since been stopped.  Magic mouth wash was stopped briefly,  however, the patient had complaints of increasing tongue and throat  soreness.  Magic mouth wash was resumed.  Gradually, the next several  days the symptoms lessened.  The patient also felt that though he was  speaking better.  The patient did develop urinary retention and required  a Foley reinsertion on November 09, 2008.  Flomax was started and Foley was  removed on November 09, 2008.  The patient was able to void on his own  without difficulty.  A clean-catch UA and culture was ordered on November 10, 2008, however, according to the computer, it is still pending.  The  patient continued to progress with CRPI.  It should also be noted that  his Lasix was discontinued.   PHYSICAL EXAMINATION:  VITAL SIGNS:  Currently, the patient is afebrile,  heart rate in the 60s, BP 122/65, O2 sat 91% on room air.  Preop weight  78 kg, today's weight down to 90.3 kg.  CARDIOVASCULAR:  Regular rate and rhythm.  PULMONARY:  Slightly decreased at the bases.  ABDOMEN:  Soft and nontender.  Bowel sounds present.  EXTREMITIES:  No lower extremity edema.  Sternal wound is clean and dry.   The patient is going to be discharged to the SNF on November 14, 2008.   LATEST LABORATORY STUDIES:  Last BMET done on November 12, 2008:  Potassium  4.7, BUN and creatinine were 23 and 2.62 respectively.  This will be  checked in the morning.   Last CBC done on November 08, 2008 showed the H  and H to be 8.9 and 26 respectively, white count of 6300, platelet count  of 261,000.  A CBC will also be drawn in the morning.   ADDITIONAL MEDICATIONS UPON DISCHARGE:  1. Xanax 0.5 mg p.o. 3 times daily p.r.n. anxiety.  2. Flomax 0.4 mg p.o. daily.  3. Magic mouth wash 5 mL p.o. 3 times daily.  4. Ensure vanilla p.o. 2 times daily.      Warren Fudge, PA      Warren [name], M.D.  Electronically Signed    DZ/MEDQ  D:  11/13/2008  T:  11/14/2008  Job:  [id number]   cc:   C. Duane Lope, M.D.

## 2010-12-25 NOTE — H&P (Signed)
NAME:  Warren, [name] NO.:  [id number]   MEDICAL RECORD NO.:  [id number]          PATIENT TYPE:  INP   LOCATION:  2303                         FACILITY:  [facility]   PHYSICIAN:  Kerin Perna, M.D.  DATE OF BIRTH:  1946/04/06   DATE OF ADMISSION:  02/10/2009  DATE OF DISCHARGE:                              HISTORY & PHYSICAL   ADMISSION DIAGNOSES:  1. Bilateral shoulder pain status post repair of type 1 aortic      dissection with a straight graft, valve suspension and      reimplantation of the coronaries 3-1/2 months ago by Dr.      Dorris Fetch.  2. Hypertension.  3. Chronic renal insufficiency.   CHIEF COMPLAINT:  Shoulder pain, dizziness, diaphoresis.   PRESENT ILLNESS:  Warren [name] is a 65 year old Caucasian hypertensive  male who underwent emergent repair of a type 1 dissection by Dr.  Dorris Fetch in mid March of this year with replacement of his ascending  aorta with a Hemashield graft, resuspension of his native valve and  reimplantation of the coronaries.  He had acute delirium for several  days postoperatively, but was ultimately discharged home on  antihypertensive medications, amiodarone and he has sinus rhythm.  He  has returned for a follow up appointment in our office.  He presented to  the emergency department at [facility] complaining of fairly  progressive and currently intense shoulder pain with some dizziness and  diaphoresis.  With a history of prior dissection, he underwent an MRI  without contrast due to his elevated creatinine of 1.9.  His blood  pressure on presentation [facility] was 160 systolic.  The  MRA/MRI of his aortic root showed enhanced contrast material around his  graft consistent with possible hematoma either fresh or old.  There is  no pericardial effusion.  There is no pleural effusion.  The patient was  then transferred to [facility] after he was given some pain  medication.  On presentation to our  emergency department, he felt  better.  His blood pressure was 110/70 in a sinus rhythm.  A  transthoracic 2-D echo was of poor quality due to his COPD.  A  transesophageal echo was performed in the emergency department by Dr.  Gala Romney which demonstrated the aortic root fairly clearly.  Aortic  valve was not dehisced, but it was with a moderate amount of aortic  insufficiency 2-3+.  There was some hematoma surrounding the graft above  the annulus, but there was no flow through this material by Doppler.  There is no pericardial effusion and overall LV function was fairly well  preserved.  The patient was then admitted to the ICU for observation and  blood pressure control.   PAST MEDICAL HISTORY:  1. Hypertension.  2. History of remote CVA.  3. History of alcohol abuse with alcohol withdrawal probably after his      initial aortic repair in March of this year.  4. Chronic renal insufficiency with creatinine 1.9.  5  BPH.  6  Transient postoperative atrial fibrillation now converted to sinus  rhythm.  MEDICATIONS:  1. Ultram.  2. Labetalol.   SOCIAL HISTORY:  He has not had alcohol since his discharge in the  spring.  He is a remote smoker.   REVIEW OF SYSTEMS:  He is fairly well recovered from his emergency heart  surgery in the spring with fairly good strength and steady appetite.  No  symptoms of CHF.  The surgical incisions in the chest and right groin  are completely healed.  He is disabled, is on Medicaid and lives with  his friend.   PHYSICAL EXAMINATION:  VITAL SIGNS:  The patient is 6 feet 3 inches.  Blood pressure 108/50, heart rate 86, oxygen saturation on 2 liters 98%.  GENERAL:  He is anxious, but in only mild pain.  HEENT:  Normocephalic.  Dentition is not clean, but no severe pathology  noted.  NECK:  Without JVD or mass.  There are 2+ bilateral carotid pulses  present.  He has no palpable adenopathy in the neck.  CARDIAC:  Reveals a regular rhythm with grade  2/6 systolic murmur and a  possible diastolic rumble.  EXTREMITIES:  He has positive strong pulses in all his extremities.  He  complains of some numbness in the ulnar distribution of his right fourth  and fifth fingers.  ABDOMEN:  Nontender.  NEUROLOGIC:  Nonfocal.   DIAGNOSTICS:  1. His MRI scan from [facility] was reviewed with the      radiologist, Dr. Fredia Sorrow.  2. A 2-D echo was reviewed with Dr. Gala Romney.  These studies again      note the presence of hematoma around the aortic graft which may be      new or old from his original operation.  The aortic root is stable      without rocking and there is no evidence of dehiscence or flow      through the hematoma surrounding the graft.  There is moderate-      severe aortic insufficiency.  There is no evidence of vegetation.   IMPRESSION/PLAN:  The patient will be admitted for blood pressure  control and observation.  The initial studies show no evidence of an  active leak at the anastomosis of his graft in the aortic root and his  pain may be related to musculoskeletal etiology.  We will watch him  carefully for now.      Kerin Perna, M.D.  Electronically Signed     PV/MEDQ  D:  02/11/2009  T:  02/11/2009  Job:  [id number]

## 2010-12-25 NOTE — Consult Note (Signed)
NAME:  Warren [name], Warren [name] NO.:  [id number]   MEDICAL RECORD NO.:  [id number]          PATIENT TYPE:  INP   LOCATION:  A339                          FACILITY:  [facility]   PHYSICIAN:  Vickki Hearing, M.D.DATE OF BIRTH:  [birth year]   DATE OF CONSULTATION:  DATE OF DISCHARGE:  06/28/2008                                 CONSULTATION   Consult requested by [facility], Dr. Elige Radon.   REASON FOR CONSULTATION:  Left knee pain.   History and physical were reviewed incorporated by reference.  Basically, the patient has had generalized muscle aching and weakness  for several weeks, presented to the hospital for evaluation, was  admitted from the emergency room.  While the evaluation was going on, he  was noted to have left knee pain and he tells me that he fell on his  left knee.  He says he had trouble walking on the left knee.   X-rays have been negative.   He was otherwise noted to have hypertensive crisis that is being  treated.  He was taking some ibuprofen at heavy doses, but has no  evidence of myoglobinuria.  He has no history of rheumatoid arthritis.   On examination, we find an awake and alert male with normal appearance,  has good pulse and perfusion to the lower extremities.  His skin is  warm, dry, and intact.  There is a red area over the left knee, but no  evidence of skin break.  He has normal sensation.  He is awake and  alert.  He is oriented x3.  His mood is normal.  His lower extremities,  left knee showed poor range of motion approximately 70 degrees total.  He holds it in flexion and flexes it up to about 90 degrees.  The knee  is stable and there is no meniscal pathology.  On physical exam, his  ligaments look fine and feel fine.   His muscle strength and muscle tone are normal, but he is sore all over  his body including his thigh, calf, and leg.   X-rays again were normal.   IMPRESSION:  Contusion, left knee.  Followup will be as needed.  I  have  recommended that he wear a knee brace.   He refuses the knee brace, but Dr. Elige Radon still wants him to be seen in  followup, although there is not much for [place] to do for this patient.     Vickki Hearing, M.D.  Electronically Signed    SEH/MEDQ  D:  06/29/2008  T:  06/29/2008  Job:  [id number]

## 2010-12-25 NOTE — Group Therapy Note (Signed)
NAME:  Warren [name], Warren [name] NO.:  [id number]   MEDICAL RECORD NO.:  [id number]          PATIENT TYPE:  INP   LOCATION:  IC06                          FACILITY:  [facility]   PHYSICIAN:  Dorris Singh, DO    DATE OF BIRTH:  10-Aug-1946   DATE OF PROCEDURE:  06/24/2008  DATE OF DISCHARGE:                                 PROGRESS NOTE   The patient was admitted with hypertensive crisis.  We were able to get  his blood pressure under better control using IV antihypertensives.  Cardiology saw him today and the patient had a 2-D echo.  Also due to  his many CVAs found on CT we will go ahead and get a carotid Doppler as  well.  The patient states that, we reviewed hand x-rays with the patient  looking more like osteoarthritis.  Also awaiting any other previous  tests that we had ordered.  The patient is complaining about having some  issues with his kidneys.  His kidney function actually worsened last  night so we will go ahead and get a renal ultrasound and consult Dr.  Kristian Covey.  Due to his history of uncontrolled hypertension this could be  a consequence of that.   PHYSICAL EXAMINATION:  VITAL SIGNS:  His vitals are as follows:  Heart  rate 72, blood pressure 153/80, respirations 12, pulse ox 96%.  GENERAL:  The patient is well-developed, well-nourished and in no  distress.  HEART:  Regular rate and rhythm.  LUNGS:  Clear to auscultation bilaterally.  ABDOMEN:  Soft, nontender, nondistended.  EXTREMITIES:  Positive pulses in all four extremities.   LABS:  Are as follows:  His CBC 9.5, hemoglobin 12.5, hematocrit 36.1,  platelet count of 285, INR is 1.1.  Sodium 137, potassium 3.5, chloride  103, CO2 25, glucose 157, BUN 36, creatinine 2.78, phosphorus is 5.0.  His cardiac enzymes are mildly elevated with a BNP of 143.   ASSESSMENT AND PLAN:  1. Hypertensive crisis which is resolving.  The patient has been taken      off IV antihypertensives and placed on p.o. meds.  He has  had an      echo done and awaiting a carotid Doppler.  2. Renal failure.  The patient also has an ultrasound ordered of his      kidneys.  We will get Dr. Kristian Covey to see him.  We will continue      with IV hydration.  3. Heartburn.  The patient has been complaining of heartburn.  He has      been getting Maalox q.4 hours.  He has a history of abuse of      ibuprofen.  We explained to him we will monitor his hemoglobin and      if it tends to decrease or if he has any other severe symptoms we      will go ahead and consult GI.  However, at this point in time we      will increase his proton pump inhibitor to b.i.d. and stop all      NSAIDs to see how  he does and continue with Maalox for palliative      relief.  4. Urinary retention.  Did a urinalysis on the patient and found that      he does have protein.  This is another reason      to get Dr. Kristian Covey to take a look at the patient's problems.      Awaiting some other tests that have not come in yet.  We will      transfer the patient out of the ICU today and if he continues to do      well we will possibly transfer him out of the hospital in the next      1-2 days.      Dorris Singh, DO  Electronically Signed     CB/MEDQ  D:  06/24/2008  T:  06/24/2008  Job:  [id number]

## 2010-12-25 NOTE — Discharge Summary (Signed)
NAME:  DEONTREY, MASSI NO.:  [id number]   MEDICAL RECORD NO.:  [id number]           PATIENT TYPE:   LOCATION:                                 FACILITY:   PHYSICIAN:  Salvatore Decent. Dorris Fetch, M.D.DATE OF BIRTH:  1945/12/20   DATE OF ADMISSION:  DATE OF DISCHARGE:                               DISCHARGE SUMMARY   Please note that under the medications, amiodarone has been stopped, so  as the remainder of the dictated medications should now be up to date  again.  Please note the amiodarone 200 mg p.o. daily has been stopped.      Doree Fudge, PA      Viviann Spare C. Dorris Fetch, M.D.  Electronically Signed    DZ/MEDQ  D:  11/13/2008  T:  11/14/2008  Job:  [id number]
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
## 2010-12-25 NOTE — Discharge Summary (Signed)
NAME:  Warren [name], Warren [name] NO.:  [id number]   MEDICAL RECORD NO.:  [id number]          PATIENT TYPE:  INP   LOCATION:  2002                         FACILITY:  [facility]   PHYSICIAN:  Salvatore Decent. Dorris Fetch, M.D.DATE OF BIRTH:  05-Mar-1946   DATE OF ADMISSION:  10/21/2008  DATE OF DISCHARGE:                               DISCHARGE SUMMARY   ADMITTING DIAGNOSES:  1. Type 1 aortic dissection.  2. History of hypertension.  3. Renal insufficiency.  4. History of cerebrovascular accident.  5. Benign prostatic hypertrophy.  6. History of alcohol abuse.   DISCHARGE DIAGNOSES:  1. Type 1 aortic dissection.  2. History of hypertension.  3. Renal insufficiency.  4. History of cerebrovascular accident.  5. Benign prostatic hypertrophy.  6. History of alcohol abuse.  7. Postoperative acute blood loss anemia.  8. Postoperative atrial fibrillation with rapid ventricular response.   PROCEDURE:  Median sternotomy, extracorporeal circulation, replacement  of ascending aorta with aortic valve resuspension, reimplantation of  coronary arteries,  __________ circulatory arrest, retrograde  cerebroplegia by Dr. Dorris Fetch on October 21, 2008.   HISTORY OF PRESENT ILLNESS:  This is a 65 year old Caucasian male with a  past medical history of hypertension, CRI, and CVA, who according to  medical records had apparently stopped taking his blood pressure  medication about 1 month prior to admission.  He had awakened 7:00 a.m.  on October 21, 2008 and experienced a sudden onset of acute chest pain.  It was described as substernal, severe, and oppositional.  It did  decrease a little, however, then it persisted.  His chest pain continued  to worsen.  He also had some lightheadedness.  However, he drove himself  to [facility] Emergency Room.  Upon arrival there, his blood pressure was  found to be 223/129.  An echocardiogram that was done suggested possible  aortic dissection.  He was transferred  to [facility] for further  evaluation and treatment.  A CT angio of the chest and abdomen were  performed.  The patient was found to have dissection of the aorta  beginning at the aortic root extending at least to thoracoabdominal  junction (type 1).  However, no significant coronary artery disease was  found.  The patient underwent repair of the type 1 aortic dissection  emergently by Dr. Dorris Fetch on October 21, 2008.   BRIEF HOSPITAL COURSE STAY:  The patient was extubated the afternoon of  postoperative day #1.  The patient remained afebrile.  Blood pressure  was found to be in the 148/80s.  He had already been started on a beta-  blocker, this was titrated accordingly.  He was also on labetalol and a  Catapres patch was added.  Creatinine was found to be elevated  postoperatively as well (the patient with a past medical history of  CRI), baseline creatinine approximately 2.0.  This was monitored  closely.  The patient was found to have acute blood loss anemia.  H and  H were 7.3 and 21.4 respectively.  He received 2 units of packed red  blood cells and followup H and H were 8.9 and 26 respectively.  The  patient did have some confusion and agitation.  Apparently, the patient  had a history of alcohol abuse.  He was given Ativan and withdrawal  protocol was initiated.  The patient continued with delirium and  required restraints.  The patient remained hypertensive and required  further blood pressure control.  He also had a low-grade temperature as  well as an increase in his white blood cell count.  He was on Fortaz  postoperatively.  Vancomycin was added.  In addition because of the  patient's severe agitation, a Panda tube was placed.  Tube feedings were  initiated.  The patient then went to atrial fibrillation with RVR and  amiodarone drip was started.  He then converted to normal sinus rhythm  and was placed on amiodarone p.o.  His pacing wires were removed on  October 27, 2008.   The patient was found to have a right pneumothorax.  An  8-French chest tube was placed with difficulty. Followup chest x-ray  revealed that the chest tube was outside the rib margin and not within  the pleural space.  Another chest tube was placed and this follow up  chest x-ray revealed that the chest tube was in the chest cavity.  There  was still a small right pneumothorax.   Over the next couple of days, the patient became less agitated.  He  remained afebrile and hemodynamically stable and his mental status  continued to improve.  Speech Pathology then evaluated the patient.  There was no evidence of dysphasia found.  Panda was removed, so the  patient was begun on liquids and his diet was increased as tolerated.  He continued to slowly progress with cardiac rehab.  Blood pressure  continued to be monitored closely.  Medications were adjusted  accordingly and currently on postop day #14, the patient is afebrile,  heart rate 73, BP 135/62, O2 saturation is 97% on room air, and today's  weight 193.6 kg.   PHYSICAL EXAMINATION:  CARDIOVASCULAR:  Regular rate and rhythm.  PULMONARY:  Clear to auscultation bilaterally.  ABDOMEN:  Benign.  EXTREMITIES:  Mild bilateral lower extremity edema.  SKIN:  Incisions are clean and dry.   The patient remained afebrile and hemodynamically stable.  He will most  likely be discharged on November 07, 2008, providing placement has been  arranged.   LATEST LABORATORY STUDIES:  Last BMET done on November 03, 2008, showed  potassium 3.8, BUN and creatinine 31 and 2.33 respectively.  Last CBC  done reveal H and H were 8.9 and 24.9 respectively, white count 8300,  and platelet count 214,000.  Last chest x-ray done on this date showed  bilateral small pleural effusions, right greater than left, with  bibasilar atelectasis again right greater than left.   DISCHARGE INSTRUCTIONS:  The patient is to remain on a low-fat, low-salt  diet.  He is not to drive or  lift more than 10 pounds.  He is to  continue with his breathing exercise daily, to walk every day and  increase his frequency and duration as he tolerates.  He may shower.  He  is to cleanse his wound with mild soap and water.  He is to call the  office any wound problems arise.   FOLLOWUP APPOINTMENTS:  1. The patient has an appointment to see Dr. Tenny Craw on November 17, 2008, at      2:45 p.m.  2. The patient has an appointment to see Dr. Dorris Fetch on November 25, 2008, at 11:30 a.m.  Prior to this office appointment, a chest x-      ray will be obtained.   DISCHARGE MEDICATIONS:  Include the following:  1. ECASA 325 mg p.o. daily.  2. Labetalol 400 mg p.o. 2 times daily.  3. Clonidine patch 0.3 mg/24 hours, apply weekly, remove old patch      before applying new one.  4. Multivitamin p.o. daily.  5. Norvasc 10 mg p.o. daily.  6. Amiodarone 200 mg p.o. daily.  7. Nu-Iron 150 mg p.o. daily.  8. Oxycodone 5 mg 1-2 tablets every 4-6 hours as needed for pain.        Doree Fudge, PA      Viviann Spare C. Dorris Fetch, M.D.  Electronically Signed    DZ/MEDQ  D:  11/04/2008  T:  11/05/2008  Job:  [id number]   cc:   Dr. Tenny Craw

## 2010-12-28 NOTE — Op Note (Signed)
[facility]  Patient:    Warren [name], Warren [name] Visit Number: [id number] MRN: [id number]          Service Type: END Location: DAY Attending Physician:  Jonathon Bellows Dictated by:   Roetta Sessions, M.D. Proc. Date: 02/03/02 Admit Date:  02/03/2002                             Operative Report  PROCEDURE:  Colonoscopy with biopsy.  INDICATION FOR PROCEDURE:  The patient is a 65 year old gentleman with ______ suprapubic pain and nausea. Recently, nausea is better on Aciphex and hyoscyamine. Still has some suprapubic abdominal pain. Urinalysis is negative. CBC and MET-7 all within normal limits. Sed rate normal. CT of the abdomen and pelvis demonstrated some fatty infiltration of the liver and some diverticulosis. No evidence of diverticulitis or any other abnormality. The colonoscopy is now being done in part as a screening maneuver and in part as a diagnostic maneuver. The approach has been discussed with the patient previously at length in my office. The potential risks, benefits, and alternatives have been reviewed and questions answered. He is agreeable. Please see my dictated consultation for more information. I feel that the patient is low-risk for conscious sedation with Versed and Demerol.  PROCEDURE NOTE:  O2 saturation, blood pressure, pulse, and respirations were monitored throughout the entire procedure.  ANESTHESIA:  Conscious sedation, Versed 4 mg IV and Demerol 75 mg IV in divided doses.  INSTRUMENT:  The Olympus videoscopic colonoscope.  FINDINGS:  Digital rectal exam revealed no abnormalities.  ENDOSCOPIC FINDINGS:  The prep was good.  Rectum and colon examination:  Rectal mucosa including retroflexed view of the anal verge revealed no abnormalities.  Descending colon:  Colonic mucosa was surveyed from the rectosigmoid junction through the left transverse right colon to the area of the appendiceal orifice, ileocecal valve, and  cecum. The patient was noted to have sigmoid diverticula. The remainder of the colonic mucosa to the cecum appeared normal except for a 3-mm polyp in the cecum adjacent to the appendiceal orifice, please see photos. This was cold biopsied/removed. From this level, the scope was slowly withdrawn. All previously mentioned mucosal surfaces were again seen. Again, no other abnormalities were observed. The patient tolerated the procedure well and is reacting.  ENDOSCOPY IMPRESSION: 1. Normal rectum. 2. Sigmoid diverticulosis. Remaining colonic mucosal appeared normal.  RECOMMENDATIONS: 1. Diverticulosis literature provided to the patient. 2. Repeat colonoscopy in 10 years. 3. Follow up with Dr. Ouida Sills. Dictated by:   Roetta Sessions, M.D. Attending Physician:  Jonathon Bellows DD:  02/03/02 TD:  02/04/02 Job: [id number]

## 2010-12-28 NOTE — Op Note (Signed)
[facility]  Patient:    LUDWIN, FLAHIVE Visit Number: [id number] MRN: [id number]          Service Type: END Location: DAY Attending Physician:  Jonathon Bellows Dictated by:   Roetta Sessions, M.D. Admit Date:  02/03/2002                             Operative Report  NO DICTATION Dictated by:   Roetta Sessions, M.D. Attending Physician:  Jonathon Bellows DD:  02/03/02 TD:  02/04/02 Job: [id number]

## 2011-02-14 ENCOUNTER — Encounter: Payer: Self-pay | Admitting: Cardiology

## 2011-05-14 LAB — DIFFERENTIAL
Basophils Absolute: 0 10*3/uL (ref 0.0–0.1)
Basophils Absolute: 0 10*3/uL (ref 0.0–0.1)
Basophils Absolute: 0 10*3/uL (ref 0.0–0.1)
Basophils Absolute: 0 10*3/uL (ref 0.0–0.1)
Basophils Relative: 0 % (ref 0–1)
Basophils Relative: 1 % (ref 0–1)
Basophils Relative: 0 % (ref 0–1)
Eosinophils Absolute: 0.1 10*3/uL (ref 0.0–0.7)
Eosinophils Absolute: 0.2 10*3/uL (ref 0.0–0.7)
Eosinophils Absolute: 0.1 10*3/uL (ref 0.0–0.7)
Eosinophils Relative: 1 % (ref 0–5)
Eosinophils Relative: 3 % (ref 0–5)
Eosinophils Relative: 2 % (ref 0–5)
Lymphocytes Relative: 12 % (ref 12–46)
Lymphocytes Relative: 14 % (ref 12–46)
Lymphs Abs: 1 10*3/uL (ref 0.7–4.0)
Lymphs Abs: 1.1 10*3/uL (ref 0.7–4.0)
Lymphs Abs: 1.2 10*3/uL (ref 0.7–4.0)
Monocytes Absolute: 0.5 10*3/uL (ref 0.1–1.0)
Monocytes Absolute: 0.8 10*3/uL (ref 0.1–1.0)
Monocytes Relative: 5 % (ref 3–12)
Monocytes Relative: 5 % (ref 3–12)
Monocytes Relative: 7 % (ref 3–12)
Monocytes Relative: 9 % (ref 3–12)
Monocytes Relative: 9 % (ref 3–12)
Neutro Abs: 5.5 10*3/uL (ref 1.7–7.7)
Neutro Abs: 8 10*3/uL — ABNORMAL HIGH (ref 1.7–7.7)
Neutro Abs: 6.2 10*3/uL (ref 1.7–7.7)
Neutro Abs: 6.4 10*3/uL (ref 1.7–7.7)
Neutrophils Relative %: 84 % — ABNORMAL HIGH (ref 43–77)
Neutrophils Relative %: 85 % — ABNORMAL HIGH (ref 43–77)
Neutrophils Relative %: 77 % (ref 43–77)
Neutrophils Relative %: 79 % — ABNORMAL HIGH (ref 43–77)

## 2011-05-14 LAB — BASIC METABOLIC PANEL
BUN: 36 mg/dL — ABNORMAL HIGH (ref 6–23)
BUN: 24 mg/dL — ABNORMAL HIGH (ref 6–23)
CO2: 24 mEq/L (ref 19–32)
CO2: 24 mEq/L (ref 19–32)
Calcium: 8.6 mg/dL (ref 8.4–10.5)
Calcium: 8.5 mg/dL (ref 8.4–10.5)
Calcium: 8.8 mg/dL (ref 8.4–10.5)
Chloride: 110 mEq/L (ref 96–112)
Creatinine, Ser: 2.8 mg/dL — ABNORMAL HIGH (ref 0.4–1.5)
Creatinine, Ser: 2.15 mg/dL — ABNORMAL HIGH (ref 0.4–1.5)
Creatinine, Ser: 2.19 mg/dL — ABNORMAL HIGH (ref 0.4–1.5)
GFR calc Af Amer: 28 mL/min — ABNORMAL LOW (ref >60)
GFR calc Af Amer: 32 mL/min — ABNORMAL LOW (ref >60)
GFR calc Af Amer: 38 mL/min — ABNORMAL LOW (ref >60)
GFR calc Af Amer: 39 mL/min — ABNORMAL LOW (ref >60)
GFR calc non Af Amer: 31 mL/min — ABNORMAL LOW (ref >60)
GFR calc non Af Amer: 32 mL/min — ABNORMAL LOW (ref >60)
Glucose, Bld: 105 mg/dL — ABNORMAL HIGH (ref 70–99)
Glucose, Bld: 106 mg/dL — ABNORMAL HIGH (ref 70–99)
Potassium: 3.3 mEq/L — ABNORMAL LOW (ref 3.5–5.1)
Potassium: 3.4 mEq/L — ABNORMAL LOW (ref 3.5–5.1)
Sodium: 138 mEq/L (ref 135–145)
Sodium: 137 mEq/L (ref 135–145)

## 2011-05-14 LAB — CARDIAC PANEL(CRET KIN+CKTOT+MB+TROPI)
CK, MB: 1.6 ng/mL (ref 0.3–4.0)
CK, MB: 2 ng/mL (ref 0.3–4.0)
Relative Index: INVALID (ref 0.0–2.5)
Relative Index: INVALID (ref 0.0–2.5)
Total CK: 27 U/L (ref 7–232)
Troponin I: 0.07 ng/mL — ABNORMAL HIGH (ref 0.00–0.06)

## 2011-05-14 LAB — MISCELLANEOUS TEST

## 2011-05-14 LAB — CBC
HCT: 35.3 % — ABNORMAL LOW (ref 39.0–52.0)
HCT: 34.6 % — ABNORMAL LOW (ref 39.0–52.0)
Hemoglobin: 12.1 g/dL — ABNORMAL LOW (ref 13.0–17.0)
Hemoglobin: 11.4 g/dL — ABNORMAL LOW (ref 13.0–17.0)
MCHC: 34.2 g/dL (ref 30.0–36.0)
MCHC: 34.9 g/dL (ref 30.0–36.0)
MCHC: 34.5 g/dL (ref 30.0–36.0)
MCV: 84.9 fL (ref 78.0–100.0)
MCV: 84.3 fL (ref 78.0–100.0)
Platelets: 285 10*3/uL (ref 150–400)
Platelets: 266 10*3/uL (ref 150–400)
RBC: 4.16 MIL/uL — ABNORMAL LOW (ref 4.22–5.81)
RBC: 4.28 MIL/uL (ref 4.22–5.81)
RBC: 3.76 MIL/uL — ABNORMAL LOW (ref 4.22–5.81)
RBC: 3.87 MIL/uL — ABNORMAL LOW (ref 4.22–5.81)
RDW: 13.3 % (ref 11.5–15.5)
RDW: 13.8 % (ref 11.5–15.5)
RDW: 13.9 % (ref 11.5–15.5)
RDW: 14 % (ref 11.5–15.5)
WBC: 9.5 10*3/uL (ref 4.0–10.5)
WBC: 8.3 10*3/uL (ref 4.0–10.5)

## 2011-05-14 LAB — URINE MICROSCOPIC-ADD ON

## 2011-05-14 LAB — PROTEIN, URINE, 24 HOUR
Collection Interval-UPROT: 24 hours
Protein, Urine: 39 mg/dL

## 2011-05-14 LAB — RAPID URINE DRUG SCREEN, HOSP PERFORMED
Barbiturates: NOT DETECTED
Cocaine: NOT DETECTED
Opiates: NOT DETECTED
Tetrahydrocannabinol: NOT DETECTED

## 2011-05-14 LAB — CREATININE CLEARANCE, URINE, 24 HOUR
Collection Interval-CRCL: 24 hours
Urine Total Volume-CRCL: 2150 mL

## 2011-05-14 LAB — URINALYSIS, ROUTINE W REFLEX MICROSCOPIC
Bilirubin Urine: NEGATIVE
Ketones, ur: NEGATIVE mg/dL
Protein, ur: >300 mg/dL — AB
Urobilinogen, UA: 0.2 mg/dL (ref 0.0–1.0)

## 2011-05-14 LAB — LIPID PANEL
Cholesterol: 182 mg/dL (ref 0–200)
HDL: 26 mg/dL — ABNORMAL LOW (ref >39)
LDL Cholesterol: 122 mg/dL — ABNORMAL HIGH (ref 0–99)
Total CHOL/HDL Ratio: 7 RATIO
VLDL: 34 mg/dL (ref 0–40)

## 2011-05-14 LAB — RENAL FUNCTION PANEL
Albumin: 2.9 g/dL — ABNORMAL LOW (ref 3.5–5.2)
BUN: 36 mg/dL — ABNORMAL HIGH (ref 6–23)
Calcium: 8.6 mg/dL (ref 8.4–10.5)
Glucose, Bld: 157 mg/dL — ABNORMAL HIGH (ref 70–99)
Phosphorus: 5.5 mg/dL — ABNORMAL HIGH (ref 2.3–4.6)
Potassium: 3.5 mEq/L (ref 3.5–5.1)

## 2011-05-14 LAB — COMPREHENSIVE METABOLIC PANEL
ALT: 48 U/L (ref 0–53)
AST: 32 U/L (ref 0–37)
Calcium: 9.6 mg/dL (ref 8.4–10.5)
GFR calc Af Amer: 31 mL/min — ABNORMAL LOW (ref >60)
Sodium: 138 mEq/L (ref 135–145)
Total Protein: 7.5 g/dL (ref 6.0–8.3)

## 2011-05-14 LAB — URIC ACID: Uric Acid, Serum: 6.9 mg/dL (ref 4.0–7.8)

## 2011-05-14 LAB — PSA: PSA: 6.15 ng/mL — ABNORMAL HIGH (ref 0.10–4.00)

## 2011-05-14 LAB — MAGNESIUM: Magnesium: 2.2 mg/dL (ref 1.5–2.5)

## 2011-05-14 LAB — URINE CULTURE
Colony Count: NO GROWTH
Culture: NO GROWTH
Special Requests: NEGATIVE

## 2011-05-14 LAB — PROTIME-INR
INR: 0.9 (ref 0.00–1.49)
INR: 1.1 (ref 0.00–1.49)

## 2011-05-14 LAB — ANA: Anti Nuclear Antibody(ANA): NEGATIVE

## 2011-05-14 LAB — PHOSPHORUS: Phosphorus: 5.6 mg/dL — ABNORMAL HIGH (ref 2.3–4.6)

## 2011-05-14 LAB — APTT: aPTT: 45 seconds — ABNORMAL HIGH (ref 24–37)

## 2011-05-14 LAB — RHEUMATOID FACTOR: Rhuematoid fact SerPl-aCnc: <20 IU/mL (ref 0–20)

## 2011-05-16 LAB — COMPREHENSIVE METABOLIC PANEL
AST: 21 U/L (ref 0–37)
Albumin: 3.2 g/dL — ABNORMAL LOW (ref 3.5–5.2)
Alkaline Phosphatase: 225 U/L — ABNORMAL HIGH (ref 39–117)
Chloride: 103 mEq/L (ref 96–112)
GFR calc Af Amer: 35 mL/min — ABNORMAL LOW (ref >60)
Potassium: 4.2 mEq/L (ref 3.5–5.1)
Sodium: 136 mEq/L (ref 135–145)
Total Bilirubin: 0.8 mg/dL (ref 0.3–1.2)

## 2011-05-16 LAB — CK: Total CK: 34 U/L (ref 7–232)

## 2011-05-16 LAB — CBC
RBC: 4.28 MIL/uL (ref 4.22–5.81)
WBC: 8.2 10*3/uL (ref 4.0–10.5)

## 2011-05-16 LAB — DIFFERENTIAL
Basophils Absolute: 0 10*3/uL (ref 0.0–0.1)
Basophils Relative: 1 % (ref 0–1)
Eosinophils Relative: 2 % (ref 0–5)
Monocytes Absolute: 0.6 10*3/uL (ref 0.1–1.0)

## 2011-08-02 ENCOUNTER — Encounter (HOSPITAL_COMMUNITY): Payer: Self-pay | Admitting: General Practice

## 2011-08-02 ENCOUNTER — Inpatient Hospital Stay (HOSPITAL_COMMUNITY)
Admission: AD | Admit: 2011-08-02 | Discharge: 2011-09-17 | DRG: 217 | Disposition: A | Discharge status: Home Health | Payer: Medicare Other | Source: Other Acute Inpatient Hospital | Attending: Thoracic Surgery (Cardiothoracic Vascular Surgery) | Admitting: Thoracic Surgery (Cardiothoracic Vascular Surgery)

## 2011-08-02 DIAGNOSIS — R5381 Other malaise: Secondary | ICD-10-CM | POA: Diagnosis not present

## 2011-08-02 DIAGNOSIS — I080 Rheumatic disorders of both mitral and aortic valves: Secondary | ICD-10-CM | POA: Diagnosis present

## 2011-08-02 DIAGNOSIS — D696 Thrombocytopenia, unspecified: Secondary | ICD-10-CM | POA: Diagnosis not present

## 2011-08-02 DIAGNOSIS — Y831 Surgical operation with implant of artificial internal device as the cause of abnormal reaction of the patient, or of later complication, without mention of misadventure at the time of the procedure: Secondary | ICD-10-CM | POA: Diagnosis present

## 2011-08-02 DIAGNOSIS — K0401 Reversible pulpitis: Secondary | ICD-10-CM | POA: Diagnosis present

## 2011-08-02 DIAGNOSIS — G4700 Insomnia, unspecified: Secondary | ICD-10-CM | POA: Diagnosis present

## 2011-08-02 DIAGNOSIS — K0530 Chronic periodontitis, unspecified: Secondary | ICD-10-CM | POA: Diagnosis present

## 2011-08-02 DIAGNOSIS — M270 Developmental disorders of jaws: Secondary | ICD-10-CM | POA: Diagnosis present

## 2011-08-02 DIAGNOSIS — D649 Anemia, unspecified: Secondary | ICD-10-CM

## 2011-08-02 DIAGNOSIS — Z23 Encounter for immunization: Secondary | ICD-10-CM

## 2011-08-02 DIAGNOSIS — I359 Nonrheumatic aortic valve disorder, unspecified: Secondary | ICD-10-CM

## 2011-08-02 DIAGNOSIS — R339 Retention of urine, unspecified: Secondary | ICD-10-CM | POA: Diagnosis not present

## 2011-08-02 DIAGNOSIS — I059 Rheumatic mitral valve disease, unspecified: Secondary | ICD-10-CM

## 2011-08-02 DIAGNOSIS — I351 Nonrheumatic aortic (valve) insufficiency: Secondary | ICD-10-CM

## 2011-08-02 DIAGNOSIS — N183 Chronic kidney disease, stage 3 unspecified: Secondary | ICD-10-CM | POA: Diagnosis present

## 2011-08-02 DIAGNOSIS — Y9210 Unspecified residential institution as the place of occurrence of the external cause: Secondary | ICD-10-CM | POA: Diagnosis present

## 2011-08-02 DIAGNOSIS — D62 Acute posthemorrhagic anemia: Secondary | ICD-10-CM | POA: Diagnosis not present

## 2011-08-02 DIAGNOSIS — I509 Heart failure, unspecified: Secondary | ICD-10-CM

## 2011-08-02 DIAGNOSIS — K029 Dental caries, unspecified: Secondary | ICD-10-CM | POA: Diagnosis present

## 2011-08-02 DIAGNOSIS — Z8673 Personal history of transient ischemic attack (TIA), and cerebral infarction without residual deficits: Secondary | ICD-10-CM

## 2011-08-02 DIAGNOSIS — J95811 Postprocedural pneumothorax: Secondary | ICD-10-CM | POA: Diagnosis not present

## 2011-08-02 DIAGNOSIS — I5041 Acute combined systolic (congestive) and diastolic (congestive) heart failure: Secondary | ICD-10-CM

## 2011-08-02 DIAGNOSIS — Z87891 Personal history of nicotine dependence: Secondary | ICD-10-CM

## 2011-08-02 DIAGNOSIS — M278 Other specified diseases of jaws: Secondary | ICD-10-CM | POA: Diagnosis present

## 2011-08-02 DIAGNOSIS — K083 Retained dental root: Secondary | ICD-10-CM | POA: Diagnosis present

## 2011-08-02 DIAGNOSIS — N179 Acute kidney failure, unspecified: Secondary | ICD-10-CM | POA: Diagnosis not present

## 2011-08-02 DIAGNOSIS — E870 Hyperosmolality and hypernatremia: Secondary | ICD-10-CM | POA: Diagnosis not present

## 2011-08-02 DIAGNOSIS — I5033 Acute on chronic diastolic (congestive) heart failure: Secondary | ICD-10-CM

## 2011-08-02 DIAGNOSIS — I498 Other specified cardiac arrhythmias: Secondary | ICD-10-CM | POA: Diagnosis not present

## 2011-08-02 DIAGNOSIS — I129 Hypertensive chronic kidney disease with stage 1 through stage 4 chronic kidney disease, or unspecified chronic kidney disease: Secondary | ICD-10-CM | POA: Diagnosis present

## 2011-08-02 DIAGNOSIS — I5043 Acute on chronic combined systolic (congestive) and diastolic (congestive) heart failure: Principal | ICD-10-CM | POA: Diagnosis present

## 2011-08-02 DIAGNOSIS — K045 Chronic apical periodontitis: Secondary | ICD-10-CM | POA: Diagnosis present

## 2011-08-02 HISTORY — DX: Heart failure, unspecified: I50.9

## 2011-08-02 HISTORY — DX: Post-traumatic stress disorder, unspecified: F43.10

## 2011-08-02 MED ORDER — POTASSIUM CHLORIDE 20 MEQ/15ML (10%) PO LIQD
40.0000 meq | Freq: Once | ORAL | Status: AC
  Administered 2011-08-03: 40 meq via ORAL
  Filled 2011-08-02: qty 30

## 2011-08-02 MED ORDER — SODIUM CHLORIDE 0.9 % IV SOLN: 250.0000 mL | INTRAVENOUS | Status: DC | PRN

## 2011-08-02 MED ORDER — LABETALOL HCL 200 MG PO TABS
200.0000 mg | ORAL_TABLET | Freq: Two times a day (BID) | ORAL | Status: DC
  Administered 2011-08-03 – 2011-08-27 (×50): 200 mg via ORAL
  Filled 2011-08-02 (×53): qty 1

## 2011-08-02 MED ORDER — MORPHINE SULFATE 2 MG/ML IJ SOLN
2.0000 mg | INTRAMUSCULAR | Status: DC | PRN
  Administered 2011-08-02 – 2011-08-20 (×46): 2 mg via INTRAVENOUS
  Filled 2011-08-02 (×47): qty 1

## 2011-08-02 MED ORDER — ASPIRIN 81 MG PO CHEW
81.0000 mg | CHEWABLE_TABLET | Freq: Every day | ORAL | Status: DC
  Administered 2011-08-03 – 2011-08-11 (×11): 81 mg via ORAL
  Filled 2011-08-02 (×5): qty 1

## 2011-08-02 MED ORDER — ACETAMINOPHEN 325 MG PO TABS: 650.0000 mg | ORAL_TABLET | ORAL | Status: DC | PRN

## 2011-08-02 MED ORDER — MORPHINE SULFATE 2 MG/ML IJ SOLN
INTRAMUSCULAR | Status: AC
  Administered 2011-08-02: 2 mg via INTRAVENOUS
  Filled 2011-08-02: qty 1

## 2011-08-02 MED ORDER — PNEUMOCOCCAL VAC POLYVALENT 25 MCG/0.5ML IJ INJ
0.5000 mL | INJECTION | INTRAMUSCULAR | Status: AC
Start: 2011-08-03 — End: 2011-08-04
  Filled 2011-08-02 (×2): qty 0.5

## 2011-08-02 MED ORDER — AMLODIPINE BESYLATE 5 MG PO TABS
5.0000 mg | ORAL_TABLET | Freq: Every day | ORAL | Status: DC
  Administered 2011-08-03 – 2011-08-10 (×8): 5 mg via ORAL
  Filled 2011-08-02 (×9): qty 1

## 2011-08-02 MED ORDER — HEPARIN SODIUM (PORCINE) 5000 UNIT/ML IJ SOLN
5000.0000 [IU] | Freq: Three times a day (TID) | INTRAMUSCULAR | Status: DC
  Administered 2011-08-03 – 2011-08-05 (×7): 5000 [IU] via SUBCUTANEOUS
  Filled 2011-08-02 (×11): qty 1

## 2011-08-02 MED ORDER — SERTRALINE HCL 50 MG PO TABS
150.0000 mg | ORAL_TABLET | Freq: Every day | ORAL | Status: DC
  Administered 2011-08-03 – 2011-08-27 (×24): 150 mg via ORAL
  Filled 2011-08-02 (×26): qty 1

## 2011-08-02 MED ORDER — HYDROCODONE-ACETAMINOPHEN 5-325 MG PO TABS
1.0000 | ORAL_TABLET | ORAL | Status: DC | PRN
  Administered 2011-08-03 – 2011-08-17 (×9): 2 via ORAL
  Administered 2011-08-23: 1 via ORAL
  Administered 2011-08-23 – 2011-08-28 (×6): 2 via ORAL
  Filled 2011-08-02 (×2): qty 2
  Filled 2011-08-02: qty 1
  Filled 2011-08-02: qty 2
  Filled 2011-08-02: qty 1
  Filled 2011-08-02 (×9): qty 2
  Filled 2011-08-02 (×2): qty 1
  Filled 2011-08-02 (×7): qty 2

## 2011-08-02 MED ORDER — ONDANSETRON HCL 4 MG/2ML IJ SOLN: 4.0000 mg | Freq: Four times a day (QID) | INTRAMUSCULAR | Status: DC | PRN

## 2011-08-02 MED ORDER — ALPRAZOLAM 0.25 MG PO TABS
0.2500 mg | ORAL_TABLET | Freq: Two times a day (BID) | ORAL | Status: DC | PRN
  Administered 2011-08-03 – 2011-08-26 (×10): 0.25 mg via ORAL
  Filled 2011-08-02 (×12): qty 1

## 2011-08-02 MED ORDER — FUROSEMIDE 10 MG/ML IJ SOLN
40.0000 mg | Freq: Every day | INTRAMUSCULAR | Status: DC
  Administered 2011-08-03: 40 mg via INTRAVENOUS
  Filled 2011-08-02 (×2): qty 4

## 2011-08-02 MED ORDER — SODIUM CHLORIDE 0.9 % IJ SOLN: 3.0000 mL | INTRAMUSCULAR | Status: DC | PRN

## 2011-08-02 MED ORDER — POTASSIUM CHLORIDE CRYS ER 20 MEQ PO TBCR
20.0000 meq | EXTENDED_RELEASE_TABLET | Freq: Every day | ORAL | Status: DC
  Administered 2011-08-03 – 2011-08-20 (×17): 20 meq via ORAL
  Filled 2011-08-02 (×19): qty 1

## 2011-08-02 MED ORDER — SODIUM CHLORIDE 0.9 % IJ SOLN
3.0000 mL | Freq: Two times a day (BID) | INTRAMUSCULAR | Status: DC
  Administered 2011-08-03 – 2011-08-11 (×14): 3 mL via INTRAVENOUS

## 2011-08-02 NOTE — H&P (Signed)
This shall serve as a brief addendum to the H&P by Dr. Kirke Corin, dated 08/02/2011 which can be found in the paper chart at the bedisde.  Mr. Warren [name] is a 65 year old gentleman transferred from [facility] this evening with a presumptive diagnosis of acute decompensated heart failure with exam & echocardiographic evidence of severe aortic regurgitation. He is warm and wet on exam and is being initiated on IV diuresis in advance of planned coronary angiography on Monday as part of an evaluation for valve surgery.    He is breathing comfortably on nasal canula while laying in bed with his head at a 30 degree angle. He is not in acute distress, aside from tooth pain which we are treating with analgesics. Given his comfort and stability, we will first replete his potassium (3.3 earlier today) with a planned first dose of IV diuretic in the morning.  Please feel free to call me anytime tonight with questions. Pager [phone]  Zacarias Pontes, MD Cardiology Fellow On-Call

## 2011-08-03 LAB — BASIC METABOLIC PANEL
CO2: 26 mEq/L (ref 19–32)
CO2: 26 mEq/L (ref 19–32)
Chloride: 102 mEq/L (ref 96–112)
Chloride: 105 mEq/L (ref 96–112)
GFR calc non Af Amer: 26 mL/min — ABNORMAL LOW (ref >90)
Glucose, Bld: 98 mg/dL (ref 70–99)
Potassium: 3.3 mEq/L — ABNORMAL LOW (ref 3.5–5.1)
Sodium: 139 mEq/L (ref 135–145)
Sodium: 142 mEq/L (ref 135–145)

## 2011-08-03 LAB — CBC
HCT: 36.2 % — ABNORMAL LOW (ref 39.0–52.0)
Hemoglobin: 11.5 g/dL — ABNORMAL LOW (ref 13.0–17.0)
MCH: 25 pg — ABNORMAL LOW (ref 26.0–34.0)
MCV: 78.7 fL (ref 78.0–100.0)
RBC: 4.6 MIL/uL (ref 4.22–5.81)

## 2011-08-03 LAB — DIFFERENTIAL
Eosinophils Absolute: 0.3 10*3/uL (ref 0.0–0.7)
Lymphs Abs: 1.3 10*3/uL (ref 0.7–4.0)
Monocytes Relative: 9 % (ref 3–12)
Neutrophils Relative %: 69 % (ref 43–77)

## 2011-08-03 MED ORDER — ENSURE CLINICAL ST REVIGOR PO LIQD
237.0000 mL | Freq: Two times a day (BID) | ORAL | Status: DC
  Administered 2011-08-03 – 2011-08-15 (×20): 237 mL via ORAL
  Administered 2011-08-16 (×2): via ORAL
  Administered 2011-08-17 – 2011-08-20 (×3): 237 mL via ORAL
  Administered 2011-08-20 – 2011-08-21 (×3): via ORAL
  Administered 2011-08-22 – 2011-08-25 (×4): 237 mL via ORAL

## 2011-08-03 MED ORDER — ENSURE PUDDING PO PUDG
1.0000 | Freq: Two times a day (BID) | ORAL | Status: DC
  Administered 2011-08-04 – 2011-08-25 (×24): 1 via ORAL

## 2011-08-03 NOTE — Progress Notes (Signed)
@   Subjective:  Denies CP or dyspnea   Objective:  Filed Vitals:   08/02/11 2027 08/02/11 2032 08/03/11 0508  BP: 132/49 146/54 145/60  Pulse: 75  86  Temp: 97.7 F (36.5 C)  97.8 F (36.6 C)  Resp: 20  18  Height: 6\' 3"  (1.905 m)    Weight: 175 lb 11.3 oz (79.7 kg)  175 lb 11.3 oz (79.7 kg)  SpO2: 96%  94%    Intake/Output from previous day:  Intake/Output Summary (Last 24 hours) at 08/03/11 1610 Last data filed at 08/03/11 9604  Gross per 24 hour  Intake      0 ml  Output    200 ml  Net   -200 ml    Physical Exam: Physical exam: Well-developed well-nourished in no acute distress.  Skin is warm and dry.  HEENT is normal.  Neck is supple. No thyromegaly.  Chest is decreased BS RLL Cardiovascular exam is regular rate and rhythm. 2/6 systolic and diastolic murmur LSB Abdominal exam nontender or distended. No masses palpated. Extremities show no edema. 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  Assessment/Plan:  1) Acute combined systolic and diastolic CHF; plan continue present meds for BP control; continue present dose of lasix; follow renal function. 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 5) Hypertension - continue present BP meds. 6) H/O  atrial flutter   [name] 08/03/2011, 7:18 AM
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
## 2011-08-04 DIAGNOSIS — I359 Nonrheumatic aortic valve disorder, unspecified: Secondary | ICD-10-CM

## 2011-08-04 DIAGNOSIS — I059 Rheumatic mitral valve disease, unspecified: Secondary | ICD-10-CM

## 2011-08-04 LAB — BASIC METABOLIC PANEL
CO2: 28 mEq/L (ref 19–32)
Calcium: 9.1 mg/dL (ref 8.4–10.5)
Glucose, Bld: 100 mg/dL — ABNORMAL HIGH (ref 70–99)
Sodium: 139 mEq/L (ref 135–145)

## 2011-08-04 MED ORDER — ASPIRIN 81 MG PO CHEW
324.0000 mg | CHEWABLE_TABLET | ORAL | Status: AC
  Administered 2011-08-05: 324 mg via ORAL
  Filled 2011-08-04: qty 4

## 2011-08-04 MED ORDER — SODIUM CHLORIDE 0.9 % IV SOLN: 250.0000 mL | INTRAVENOUS | Status: DC | PRN

## 2011-08-04 MED ORDER — ZOLPIDEM TARTRATE 5 MG PO TABS
5.0000 mg | ORAL_TABLET | Freq: Every evening | ORAL | Status: DC | PRN
  Administered 2011-08-04 – 2011-08-27 (×19): 5 mg via ORAL
  Filled 2011-08-04 (×19): qty 1

## 2011-08-04 MED ORDER — DIAZEPAM 5 MG PO TABS: 5.0000 mg | ORAL_TABLET | ORAL | Status: DC

## 2011-08-04 MED ORDER — SODIUM CHLORIDE 0.9 % IJ SOLN
3.0000 mL | Freq: Two times a day (BID) | INTRAMUSCULAR | Status: DC
  Administered 2011-08-04 – 2011-08-12 (×10): 3 mL via INTRAVENOUS

## 2011-08-04 MED ORDER — SODIUM CHLORIDE 0.9 % IJ SOLN: 3.0000 mL | INTRAMUSCULAR | Status: DC | PRN

## 2011-08-04 MED ORDER — SODIUM CHLORIDE 0.9 % IV SOLN
INTRAVENOUS | Status: DC
  Administered 2011-08-05: 05:00:00 via INTRAVENOUS

## 2011-08-04 NOTE — Progress Notes (Signed)
Procedure(s) (LRB): CORONARY ANGIOGRAM (Right) Subjective: No complaints Denies CP/SOB  Objective: Vital signs in last 24 hours: Temp:  [97.7 F (36.5 C)-98.3 F (36.8 C)] 97.7 F (36.5 C) (12/23 0542) Pulse Rate:  [71-88] 79  (12/23 0542) Cardiac Rhythm:  [-] Normal sinus rhythm (12/22 2000) Resp:  [18-20] 18  (12/23 0542) BP: (133-143)/(44-61) 137/46 mmHg (12/23 0542) SpO2:  [93 %-95 %] 93 % (12/23 0542) Weight:  [77.338 kg (170 lb 8 oz)] 170 lb 8 oz (77.338 kg) (12/23 0542)  Hemodynamic parameters for last 24 hours:    Intake/Output from previous day: 12/22 0701 - 12/23 0700 In: 1580 [P.O.:1580] Out: 1625 [Urine:1625] Intake/Output this shift:    unchanged  Lab Results:  Basename 08/03/11 0012  WBC 7.7  HGB 11.5*  HCT 36.2*  PLT 212   BMET:  Basename 08/04/11 0642 08/03/11 0600  NA 139 142  K 3.4* 3.6  CL 102 105  CO2 28 26  GLUCOSE 100* 104*  BUN 29* 28*  CREATININE 2.41* 2.34*  CALCIUM 9.1 9.0    PT/INR: No results found for this basename: LABPROT,INR in the last 72 hours ABG    Component Value Date/Time   PHART 7.508* 10/22/2008 1345   HCO3 20.0 10/22/2008 1345   TCO2 28 10/24/2008 1805   ACIDBASEDEF 2.0 10/22/2008 1345   O2SAT 99.0 10/22/2008 1345   CBG (last 3)  No results found for this basename: GLUCAP:3 in the last 72 hours  Assessment/Plan: S/P Procedure(s) (LRB): CORONARY ANGIOGRAM (Right) For cath tomorrow Needs dental eval prior to surgery I will be away next week. Will follow up 12/31. My partners will be available if any questions arise next week.   LOS: 2 days    Warren [name] C 08/04/2011
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
## 2011-08-04 NOTE — Plan of Care (Signed)
Problem: Phase I Progression Outcomes Goal: Pain controlled with appropriate interventions Outcome: Not Progressing Patient has frequent complaints of severe tooth pain not relieved by po pain meds.

## 2011-08-04 NOTE — Progress Notes (Signed)
@   Subjective:  Denies CP or dyspnea   Objective:  Filed Vitals:   08/03/11 1400 08/03/11 2156 08/04/11 0539 08/04/11 0542  BP: 133/61 143/44 137/46 137/46  Pulse: 88 71 79 79  Temp: 97.8 F (36.6 C) 98.3 F (36.8 C) 97.7 F (36.5 C) 97.7 F (36.5 C)  TempSrc: Oral Oral Oral Oral  Resp: 20 18 20 18  Height:      Weight:    170 lb 8 oz (77.338 kg)  SpO2: 95% 94% 94% 93%    Intake/Output from previous day:  Intake/Output Summary (Last 24 hours) at 08/04/11 0908 Last data filed at 08/04/11 0545  Gross per 24 hour  Intake   1220 ml  Output   1225 ml  Net     -5 ml    Physical Exam: Physical exam: Well-developed well-nourished in no acute distress.  Skin is warm and dry.  HEENT is normal.  Neck is supple. No thyromegaly.  Chest is decreased BS RLL Cardiovascular exam is regular rate and rhythm. 2/6 systolic and diastolic murmur LSB Abdominal exam nontender or distended. No masses palpated. Extremities show no edema. 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  Assessment/Plan:  1) Acute combined systolic and diastolic CHF; plan continue present meds for BP control; hold lasix in anticipation of cath in AM (risks and benefits of cath including MI, death, CVA and renal insufficiency discussed and patient agrees to proceed). 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 Will also need dental consult preoperatively 5) Hypertension - continue present BP meds. 6) H/O atrial flutter   Brian Crenshaw 08/04/2011, 9:08 AM    

## 2011-08-05 ENCOUNTER — Encounter (HOSPITAL_COMMUNITY)
Admission: AD | Disposition: A | Discharge status: Home Health | Payer: Self-pay | Source: Other Acute Inpatient Hospital | Attending: Internal Medicine

## 2011-08-05 DIAGNOSIS — I059 Rheumatic mitral valve disease, unspecified: Secondary | ICD-10-CM

## 2011-08-05 DIAGNOSIS — I359 Nonrheumatic aortic valve disorder, unspecified: Secondary | ICD-10-CM

## 2011-08-05 HISTORY — PX: LEFT HEART CATHETERIZATION WITH CORONARY ANGIOGRAM: SHX5451

## 2011-08-05 LAB — BASIC METABOLIC PANEL
BUN: 33 mg/dL — ABNORMAL HIGH (ref 6–23)
GFR calc Af Amer: 33 mL/min — ABNORMAL LOW (ref >90)
GFR calc non Af Amer: 28 mL/min — ABNORMAL LOW (ref >90)
Potassium: 3.6 mEq/L (ref 3.5–5.1)

## 2011-08-05 LAB — CREATININE, SERUM: GFR calc Af Amer: 34 mL/min — ABNORMAL LOW (ref >90)

## 2011-08-05 LAB — CBC
Hemoglobin: 11.5 g/dL — ABNORMAL LOW (ref 13.0–17.0)
MCH: 24.5 pg — ABNORMAL LOW (ref 26.0–34.0)
MCV: 79.5 fL (ref 78.0–100.0)
Platelets: 220 10*3/uL (ref 150–400)
RBC: 4.69 MIL/uL (ref 4.22–5.81)
WBC: 7.8 10*3/uL (ref 4.0–10.5)

## 2011-08-05 LAB — POCT ACTIVATED CLOTTING TIME: Activated Clotting Time: 127 seconds

## 2011-08-05 SURGERY — LEFT HEART CATHETERIZATION WITH CORONARY ANGIOGRAM
Anesthesia: LOCAL

## 2011-08-05 MED ORDER — FENTANYL CITRATE 0.05 MG/ML IJ SOLN
INTRAMUSCULAR | Status: AC
  Filled 2011-08-05: qty 2

## 2011-08-05 MED ORDER — SODIUM CHLORIDE 0.9 % IV SOLN: 250.0000 mL | INTRAVENOUS | Status: DC | PRN

## 2011-08-05 MED ORDER — ASPIRIN 81 MG PO CHEW
81.0000 mg | CHEWABLE_TABLET | Freq: Every day | ORAL | Status: DC
  Administered 2011-08-05 – 2011-08-18 (×10): 81 mg via ORAL
  Filled 2011-08-05 (×8): qty 1

## 2011-08-05 MED ORDER — MIDAZOLAM HCL 2 MG/2ML IJ SOLN
INTRAMUSCULAR | Status: AC
  Filled 2011-08-05: qty 2

## 2011-08-05 MED ORDER — ACETAMINOPHEN 325 MG PO TABS: 650.0000 mg | ORAL_TABLET | ORAL | Status: DC | PRN

## 2011-08-05 MED ORDER — LIDOCAINE HCL (PF) 1 % IJ SOLN
INTRAMUSCULAR | Status: AC
  Filled 2011-08-05: qty 30

## 2011-08-05 MED ORDER — SODIUM CHLORIDE 0.9 % IJ SOLN
3.0000 mL | Freq: Two times a day (BID) | INTRAMUSCULAR | Status: DC
Start: 2011-08-05 — End: 2011-08-21
  Administered 2011-08-05 – 2011-08-12 (×11): 3 mL via INTRAVENOUS
  Administered 2011-08-13: 10 mL via INTRAVENOUS
  Administered 2011-08-14 – 2011-08-21 (×13): 3 mL via INTRAVENOUS

## 2011-08-05 MED ORDER — HEPARIN SODIUM (PORCINE) 5000 UNIT/ML IJ SOLN
5000.0000 [IU] | Freq: Three times a day (TID) | INTRAMUSCULAR | Status: AC
  Administered 2011-08-05 – 2011-08-18 (×38): 5000 [IU] via SUBCUTANEOUS
  Filled 2011-08-05 (×40): qty 1

## 2011-08-05 MED ORDER — ONDANSETRON HCL 4 MG/2ML IJ SOLN
4.0000 mg | Freq: Four times a day (QID) | INTRAMUSCULAR | Status: DC | PRN
  Administered 2011-08-17 – 2011-08-19 (×5): 4 mg via INTRAVENOUS
  Filled 2011-08-05 (×5): qty 2

## 2011-08-05 MED ORDER — HYDRALAZINE HCL 25 MG PO TABS
25.0000 mg | ORAL_TABLET | Freq: Three times a day (TID) | ORAL | Status: DC
  Administered 2011-08-05 – 2011-08-10 (×16): 25 mg via ORAL
  Filled 2011-08-05 (×20): qty 1

## 2011-08-05 MED ORDER — HEPARIN (PORCINE) IN NACL 2-0.9 UNIT/ML-% IJ SOLN
INTRAMUSCULAR | Status: AC
  Filled 2011-08-05: qty 2000

## 2011-08-05 MED ORDER — SODIUM CHLORIDE 0.9 % IJ SOLN
3.0000 mL | INTRAMUSCULAR | Status: DC | PRN
  Administered 2011-08-07 – 2011-08-15 (×2): 3 mL via INTRAVENOUS

## 2011-08-05 NOTE — H&P (View-Only) (Signed)
@   Subjective:  Denies CP or dyspnea   Objective:  Filed Vitals:   08/03/11 1400 08/03/11 2156 08/04/11 0539 08/04/11 0542  BP: 133/61 143/44 137/46 137/46  Pulse: 88 71 79 79  Temp: 97.8 F (36.6 C) 98.3 F (36.8 C) 97.7 F (36.5 C) 97.7 F (36.5 C)  TempSrc: Oral Oral Oral Oral  Resp: 20 18 20 18   Height:      Weight:    170 lb 8 oz (77.338 kg)  SpO2: 95% 94% 94% 93%    Intake/Output from previous day:  Intake/Output Summary (Last 24 hours) at 08/04/11 0908 Last data filed at 08/04/11 0545  Gross per 24 hour  Intake   1220 ml  Output   1225 ml  Net     -5 ml    Physical Exam: Physical exam: Well-developed well-nourished in no acute distress.  Skin is warm and dry.  HEENT is normal.  Neck is supple. No thyromegaly.  Chest is decreased BS RLL Cardiovascular exam is regular rate and rhythm. 2/6 systolic and diastolic murmur LSB Abdominal exam nontender or distended. No masses palpated. Extremities show no edema. 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  Assessment/Plan:  1) Acute combined systolic and diastolic CHF; plan continue present meds for BP control; hold lasix in anticipation of cath in AM (risks and benefits of cath including MI, death, CVA and renal insufficiency discussed and patient agrees to proceed). 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 Will also need dental consult preoperatively 5) Hypertension - continue present BP meds. 6) H/O atrial flutter   [name] 08/04/2011, 9:08 AM

## 2011-08-05 NOTE — Progress Notes (Signed)
UR Completed.  Warren [name] 08/05/2011 [phone]  

## 2011-08-05 NOTE — Interval H&P Note (Signed)
History and Physical Interval Note:  08/05/2011 8:53 AM  Warren [name]  has presented today for surgery, with the diagnosis of AI/MR - RO CAD prior to valve surgery  The various methods of treatment have been discussed with the patient and family. After consideration of risks, benefits and other options for treatment, the patient has consented to  Procedure(s): LEFT HEART CATHETERIZATION WITH CORONARY ANGIOGRAM as a surgical intervention .  The patients' history has been reviewed, patient examined, no change in status, stable for surgery.  I have reviewed the patients' chart and labs.  Questions were answered to the patient's satisfaction.     Kiona Blume [employer]

## 2011-08-05 NOTE — Progress Notes (Signed)
TCTS BRIEF PROGRESS NOTE   Results of cath noted. [name] remains clinically stable and denies SOB. His only complaint is painful tooth. Await dental consult as per Dr. Sunday Corn previous note.  Warren [name] H 08/05/2011 11:59 AM

## 2011-08-05 NOTE — Procedures (Signed)
   Cardiac Catheterization Procedure Note  Name: Warren [name] MRN: [id number] DOB: Sep 23, 1945  Procedure: Left Heart Cath, Selective Coronary Angiography  Indication: Pre-cardiac surgery.  Patient has history of aortic dissection repair with re-implantation of the coronaries. He was found by TEE to have severe AI and severe MR.    Procedural details: The right groin was prepped, draped, and anesthetized with 1% lidocaine. Using modified Seldinger technique, a 5 French sheath was introduced into the right femoral artery. MP catheter was used to engage the LCA, JR4 catheter was used to engage the RCA. Catheter exchanges were performed over a guidewire. There were no immediate procedural complications. The patient was transferred to the post catheterization recovery area for further monitoring.  Procedural Findings: Hemodynamics:  AO 136/50 LV 128/25   Coronary angiography: Coronary dominance: right  Left mainstem: No angiographic CAD.   Left anterior descending (LAD): Mild luminal irregularities.   Left circumflex (LCx): There was a moderate-sized ramus.  No angiographic CAD.   Right coronary artery (RCA): There was a stump of an artery identified, suspect this was the remains of the pre-surgery RCA.  The re-implanted RCA showed no angiographic CAD.   Left ventriculography: Not done because of renal dysfunction.   55 cc contrast used.   Final Conclusions:  No significant CAD.  Elevated LV end diastolic pressure.   Recommendations:  1. Plan for cardiac surgery: aortic and mitral valve replacements.  2. Will not hydrate given elevated LV end diastolic pressure, but will try to hold off on Lasix use today.  Will likely need to restart Lasix tomorrow.  3. Add hydralazine for afterload reduction.  BP still running a bit high.   [name] 08/05/2011, 8:57 AM

## 2011-08-06 DIAGNOSIS — I359 Nonrheumatic aortic valve disorder, unspecified: Secondary | ICD-10-CM

## 2011-08-06 LAB — CBC
HCT: 35.6 % — ABNORMAL LOW (ref 39.0–52.0)
MCV: 78.8 fL (ref 78.0–100.0)
Platelets: 211 10*3/uL (ref 150–400)
RBC: 4.52 MIL/uL (ref 4.22–5.81)
RDW: 15.7 % — ABNORMAL HIGH (ref 11.5–15.5)
WBC: 7.5 10*3/uL (ref 4.0–10.5)

## 2011-08-06 LAB — COMPREHENSIVE METABOLIC PANEL
AST: 15 U/L (ref 0–37)
Albumin: 3.4 g/dL — ABNORMAL LOW (ref 3.5–5.2)
Alkaline Phosphatase: 115 U/L (ref 39–117)
CO2: 24 mEq/L (ref 19–32)
Chloride: 101 mEq/L (ref 96–112)
GFR calc non Af Amer: 35 mL/min — ABNORMAL LOW (ref >90)
Potassium: 4.2 mEq/L (ref 3.5–5.1)
Total Bilirubin: 1 mg/dL (ref 0.3–1.2)

## 2011-08-06 LAB — BASIC METABOLIC PANEL
CO2: 26 mEq/L (ref 19–32)
Calcium: 9.5 mg/dL (ref 8.4–10.5)
Chloride: 101 mEq/L (ref 96–112)
Creatinine, Ser: 2.09 mg/dL — ABNORMAL HIGH (ref 0.50–1.35)
GFR calc Af Amer: 37 mL/min — ABNORMAL LOW (ref >90)
Sodium: 135 mEq/L (ref 135–145)

## 2011-08-06 NOTE — Significant Event (Signed)
Pt noted having 9 beat run of Vtach. Pt asymptomatic with vitals noted 98.1T, 80P, 140/54BP, 16R, 93% O2 SATS. On call MD notified with orders to give scheduled Labetalol and Hydralizine. Given, will continue to monitor.

## 2011-08-06 NOTE — Progress Notes (Signed)
Patient ID: Warren [name], male   DOB: 1945/10/14, 65 y.o.   MRN: [id number] SUBJECTIVE:   Patient is stable today. He has no chest pain or shortness of breath.   Filed Vitals:   08/06/11 0459 08/06/11 0500 08/06/11 1037 08/06/11 1042  BP: 144/59  160/64   Pulse: 79   91  Temp: 98.6 F (37 C)     TempSrc:      Resp: 20     Height:      Weight:  178 lb 1.6 oz (80.786 kg)    SpO2: 96%       Intake/Output Summary (Last 24 hours) at 08/06/11 1301 Last data filed at 08/06/11 1043  Gross per 24 hour  Intake 2300.6 ml  Output   1250 ml  Net 1050.6 ml    LABS: Basic Metabolic Panel:  Basename 08/06/11 0552 08/05/11 1223 08/05/11 0615  NA 135 -- 138  K 4.0 -- 3.6  CL 101 -- 102  CO2 26 -- 25  GLUCOSE 117* -- 97  BUN 31* -- 33*  CREATININE 2.09* 2.20* --  CALCIUM 9.5 -- 9.4  MG -- -- --  PHOS -- -- --   Liver Function Tests: No results found for this basename: AST:2,ALT:2,ALKPHOS:2,BILITOT:2,PROT:2,ALBUMIN:2 in the last 72 hours No results found for this basename: LIPASE:2,AMYLASE:2 in the last 72 hours CBC:  Basename 08/06/11 0552 08/05/11 1223  WBC 7.5 7.8  NEUTROABS -- --  HGB 11.3* 11.5*  HCT 35.6* 37.3*  MCV 78.8 79.5  PLT 211 220   Cardiac Enzymes: No results found for this basename: CKTOTAL:3,CKMB:3,CKMBINDEX:3,TROPONINI:3 in the last 72 hours BNP: No components found with this basename: POCBNP:3 D-Dimer: No results found for this basename: DDIMER:2 in the last 72 hours Hemoglobin A1C: No results found for this basename: HGBA1C in the last 72 hours Fasting Lipid Panel: No results found for this basename: CHOL,HDL,LDLCALC,TRIG,CHOLHDL,LDLDIRECT in the last 72 hours Thyroid Function Tests: No results found for this basename: TSH,T4TOTAL,FREET3,T3FREE,THYROIDAB in the last 72 hours  RADIOLOGY: No results found.  PHYSICAL EXAM   Patient is stable. He has loud murmurs. His lungs reveal scattered rhonchi.   TELEMETRY:   Telemetry is reviewed. There  is normal sinus rhythm.   ASSESSMENT AND PLAN:  Active Problems:   RENAL INSUFFICIENCY, CHRONIC   Renal function is slightly better today. However it is in its usual baseline level.   AI (aortic insufficiency)    The patient will receive valvular heart surgery after his teeth and and evaluated   Acute combined systolic and diastolic heart failure Volume status is stable today. No change in therapy.   Willa Rough 08/06/2011 1:01 PM

## 2011-08-07 ENCOUNTER — Other Ambulatory Visit: Payer: Self-pay

## 2011-08-07 ENCOUNTER — Inpatient Hospital Stay (HOSPITAL_COMMUNITY): Payer: Medicare Other

## 2011-08-07 DIAGNOSIS — Z01810 Encounter for preprocedural cardiovascular examination: Secondary | ICD-10-CM

## 2011-08-07 DIAGNOSIS — I059 Rheumatic mitral valve disease, unspecified: Secondary | ICD-10-CM

## 2011-08-07 DIAGNOSIS — I359 Nonrheumatic aortic valve disorder, unspecified: Secondary | ICD-10-CM

## 2011-08-07 MED ORDER — NITROGLYCERIN 0.4 MG SL SUBL
SUBLINGUAL_TABLET | SUBLINGUAL | Status: AC
  Administered 2011-08-07: 0.4 mg
  Filled 2011-08-07: qty 25

## 2011-08-07 MED ORDER — ALBUTEROL SULFATE (5 MG/ML) 0.5% IN NEBU
2.5000 mg | INHALATION_SOLUTION | Freq: Once | RESPIRATORY_TRACT | Status: AC
  Administered 2011-08-07: 2.5 mg via RESPIRATORY_TRACT

## 2011-08-07 NOTE — Progress Notes (Signed)
TCTS BRIEF PROGRESS NOTE   Clinically stable Awaiting dental consultation/extraction Possible AVR +/- MVR per Dr Dorris Fetch next week  [facility] H 08/07/2011 4:07 PM

## 2011-08-07 NOTE — Progress Notes (Signed)
Bilateral:  No evidence of hemodynamically significant internal carotid artery stenosis.   Vertebral artery flow is antegrade.    Palmer arch evaluation - Doppler waveform reverse bilaterally with ulnar compression with flow diminishing greater than 50% on the right. Doppler waveforms remained normal bilaterlla with radial compressions. Mila Homer 08/07/2011, 2:19 PM

## 2011-08-07 NOTE — Progress Notes (Signed)
Pt had compliant's of CP. Vitals were checked and stable, tech did EKG, and sublingual nitro was given. Pt is now stating pain is a 3. Alinda Money, PA was notified.

## 2011-08-07 NOTE — Progress Notes (Signed)
   SUBJECTIVE: No chest pain but some SOB.     PHYSICAL EXAM Filed Vitals:   08/06/11 1540 08/06/11 2054 08/06/11 2135 08/07/11 0500  BP: 140/48 138/62 140/54 149/45  Pulse:  83 80 81  Temp:  97.8 F (36.6 C)  97.8 F (36.6 C)  TempSrc:  Oral  Oral  Resp:  18  20  Height:      Weight:    179 lb 10.8 oz (81.5 kg)  SpO2:    94%   General:  No distress Lungs:  Clear Heart:  AI murmur, slight systolic murmur at the apex Abdomen:  Positive bowel sounds, no rebound no guarding Extremities:  No edema.  LABS: Lab Results  Component Value Date   CKTOTAL 206 11/01/2008   CKMB 2.5 11/01/2008   TROPONINI  Value: 0.32        PERSISTENTLY INCREASED TROPONIN VALUES IN THE RANGE OF 0.06-0.49 ng/mL CAN BE SEEN IN:       -UNSTABLE ANGINA       -CONGESTIVE HEART FAILURE       -MYOCARDITIS       -CHEST TRAUMA       -ARRYHTHMIAS       -LATE PRESENTING MI       -COPD   CLINICAL FOLLOW-UP RECOMMENDED.* 11/01/2008   Results for orders placed during the hospital encounter of 08/02/11 (from the past 24 hour(s))  COMPREHENSIVE METABOLIC PANEL     Status: Abnormal   Collection Time   08/06/11 10:12 PM      Component Value Range   Sodium 136  135 - 145 (mEq/L)   Potassium 4.2  3.5 - 5.1 (mEq/L)   Chloride 101  96 - 112 (mEq/L)   CO2 24  19 - 32 (mEq/L)   Glucose, Bld 104 (*) 70 - 99 (mg/dL)   BUN 28 (*) 6 - 23 (mg/dL)   Creatinine, Ser 7.82 (*) 0.50 - 1.35 (mg/dL)   Calcium 9.4  8.4 - 95.6 (mg/dL)   Total Protein 6.0  6.0 - 8.3 (g/dL)   Albumin 3.4 (*) 3.5 - 5.2 (g/dL)   AST 15  0 - 37 (U/L)   ALT 11  0 - 53 (U/L)   Alkaline Phosphatase 115  39 - 117 (U/L)   Total Bilirubin 1.0  0.3 - 1.2 (mg/dL)   GFR calc non Af Amer 35 (*) >90 (mL/min)   GFR calc Af Amer 41 (*) >90 (mL/min)  MAGNESIUM     Status: Normal   Collection Time   08/06/11 10:12 PM      Component Value Range   Magnesium 2.1  1.5 - 2.5 (mg/dL)    Intake/Output Summary (Last 24 hours) at 08/07/11 0835 Last data filed at 08/07/11  2130  Gross per 24 hour  Intake    493 ml  Output    875 ml  Net   -382 ml    ASSESSMENT AND PLAN:  1)  RENAL INSUFFICIENCY, CHRONIC:  Creat slightly better today.   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  I will call them today.   3) Acute combined systolic and diastolic heart failure:  He seems to be euvolemic.  I will hold off on diuretics today.  Continue other meds as listed.    4)  HTN:  BP is better than previous.  I will continue the meds a listed.   Rollene Rotunda 08/07/2011 8:35 AM

## 2011-08-08 DIAGNOSIS — I5041 Acute combined systolic (congestive) and diastolic (congestive) heart failure: Secondary | ICD-10-CM

## 2011-08-08 LAB — BASIC METABOLIC PANEL
BUN: 26 mg/dL — ABNORMAL HIGH (ref 6–23)
CO2: 24 mEq/L (ref 19–32)
Calcium: 9.2 mg/dL (ref 8.4–10.5)
Creatinine, Ser: 1.8 mg/dL — ABNORMAL HIGH (ref 0.50–1.35)
GFR calc non Af Amer: 38 mL/min — ABNORMAL LOW (ref >90)
Glucose, Bld: 105 mg/dL — ABNORMAL HIGH (ref 70–99)

## 2011-08-08 MED ORDER — FUROSEMIDE 40 MG PO TABS
40.0000 mg | ORAL_TABLET | Freq: Every day | ORAL | Status: DC
  Administered 2011-08-08: 40 mg via ORAL
  Filled 2011-08-08 (×2): qty 1

## 2011-08-08 NOTE — Progress Notes (Signed)
    SUBJECTIVE: No chest pain but SOB when up and walking.     PHYSICAL EXAM Filed Vitals:   08/07/11 1323 08/07/11 2138 08/08/11 0536 08/08/11 0554  BP: 137/44 147/47 146/45 108/71  Pulse: 75 83 84 85  Temp: 97.5 F (36.4 C) 98.6 F (37 C) 97.7 F (36.5 C) 98.3 F (36.8 C)  TempSrc: Oral     Resp: 18 20 20 20   Height:      Weight:   80.2 kg (176 lb 12.9 oz) 91.9 kg (202 lb 9.6 oz)  SpO2: 95% 94% 97% 92%   General:  No distress Lungs:  Clear Heart:  AI murmur, slight systolic murmur at the apex Abdomen:  Positive bowel sounds, no rebound no guarding Extremities:  No edema.   Intake/Output Summary (Last 24 hours) at 08/08/11 0800 Last data filed at 08/08/11 6213  Gross per 24 hour  Intake    533 ml  Output   1375 ml  Net   -842 ml    ASSESSMENT AND PLAN:  1)  RENAL INSUFFICIENCY, CHRONIC:  BMET pending   2) AI (aortic insufficiency)/MR:  Needs valve surgery after the dental consult.  I called yesterday and got a recording without a number for a covering MD.  I will try again today to get this moving.   3) Acute combined systolic and diastolic heart failure:  Weight is going up.  I will start po diuretics back today and follow the BMET closely.    4) HTN:  BP is better than previous.  I will continue the meds a listed.   [name] Magdalena Skilton 08/08/2011 8:00 AM

## 2011-08-08 NOTE — Progress Notes (Signed)
UR Completed.  Draylon Mercadel Norris 08/08/2011 [phone]  

## 2011-08-08 NOTE — Progress Notes (Signed)
Nutrition Follow-up  Diet Order:  Heart healthy  Meds: Scheduled Meds:   . amLODipine  5 mg Oral Daily  . aspirin  81 mg Oral Daily  . aspirin  81 mg Oral Daily  . feeding supplement  237 mL Oral BID  . feeding supplement  1 Container Oral BID BM  . furosemide  40 mg Oral Daily  . heparin  5,000 Units Subcutaneous Q8H  . hydrALAZINE  25 mg Oral Q8H  . labetalol  200 mg Oral BID  . potassium chloride  20 mEq Oral Daily  . sertraline  150 mg Oral Daily  . sodium chloride  3 mL Intravenous Q12H  . sodium chloride  3 mL Intravenous Q12H  . sodium chloride  3 mL Intravenous Q12H   Continuous Infusions:  PRN Meds:.sodium chloride, sodium chloride, sodium chloride, acetaminophen, ALPRAZolam, HYDROcodone-acetaminophen, morphine injection, ondansetron (ZOFRAN) IV, sodium chloride, sodium chloride, sodium chloride, zolpidem  Labs:  CMP     Component Value Date/Time   NA 138 08/08/2011 0925   K 3.9 08/08/2011 0925   CL 103 08/08/2011 0925   CO2 24 08/08/2011 0925   GLUCOSE 105* 08/08/2011 0925   BUN 26* 08/08/2011 0925   CREATININE 1.80* 08/08/2011 0925   CREATININE 2.19* 06/26/2008 2130   CALCIUM 9.2 08/08/2011 0925   PROT 6.0 08/06/2011 2212   ALBUMIN 3.4* 08/06/2011 2212   AST 15 08/06/2011 2212   ALT 11 08/06/2011 2212   ALKPHOS 115 08/06/2011 2212   BILITOT 1.0 08/06/2011 2212   GFRNONAA 38* 08/08/2011 0925   GFRAA 44* 08/08/2011 0925     Intake/Output Summary (Last 24 hours) at 08/08/11 1505 Last data filed at 08/08/11 1237  Gross per 24 hour  Intake    663 ml  Output   1250 ml  Net   -587 ml   RD spoke with patient who stated that he is eating as well as he can right now. He gets short of breath when he eats. Patient confirmed he is consuming some of his ordered supplements. Patient agreed to try and eat at least one Ensure pudding and drink at least one Ensure shake daily.   Weight Status:  202 lbs,  trending up  Nutrition Dx:  Predicted sub-optimal intake,  ongoing   Goal:  Pt to consume >75% of meals and supplements, unmet  Intervention:  Encourage patient to take supplements as able in addition to meals  Monitor:  PO intake, weights, I/O's, labs   Rudean Haskell Pager #:  [phone]

## 2011-08-08 NOTE — Clinical Documentation Improvement (Signed)
CKD DOCUMENTATION CLARIFICATION QUERY   THIS DOCUMENT IS NOT A PERMANENT PART OF THE MEDICAL RECORD   Please update your documentation within the medical record to reflect your response to this query.                                                                                         08/08/11   Dr. Antoine Poche,  In a better effort to capture your patient's severity of illness, reflect appropriate length of stay and utilization of resources, a review of the patient medical record has revealed the following indicators.    Based on your clinical judgment, please document in the progress notes and discharge summary if a condition below provides greater specificity regarding the patient's renal status :  CKD Stage I - GFR =90  CKD Stage II - GFR 60-80  CKD Stage III - GFR 30-59  CKD Stage IV - GFR 15-29  CKD Stage V - GFR < 15  Other Condition (please specify)  Unable to Clinically Determine    Clinical Information:   "Renal insufficiency - follow renal function closely" Warren [name] 08/03/2011, 7:18 AM  "RENAL INSUFFICIENCY, CHRONIC: Creat slightly better today." Warren [name] 08/07/2011 8:35 AM  BUN/Cr.GFR (range since transfer) (wm) 30/2.46/26  (08/03/11) 26/1.86/38  (08/08/11)  In responding to this query please exercise your independent judgment.  The fact that a query is asked, does not imply that any particular answer is desired or expected.  Reviewed:  08/12/11 Dr. Antoine Poche documented "CKD is Stage 3" in pn 08/09/11 at 1754pm.  Mathis Dad Thank You,  Jerral Ralph  RN BSN Certified Clinical Documentation Specialist: Cell   [phone]  Health Information Management [place]  TO RESPOND TO THE THIS QUERY, FOLLOW THE INSTRUCTIONS BELOW:  1. If needed, update documentation for the patient's encounter via the notes activity.  2. Access this query again and click edit on the In [employer].  3. After updating, or not, click F2 to complete all  highlighted (required) fields concerning your review. Select "additional documentation in the medical record" OR "no additional documentation provided".  4. Click Sign note button.  5. The deficiency will fall out of your In Basket *Please let us know if you are not able to complete this workflow by phone or e-mail (listed below).

## 2011-08-09 LAB — BASIC METABOLIC PANEL
BUN: 26 mg/dL — ABNORMAL HIGH (ref 6–23)
Calcium: 9.1 mg/dL (ref 8.4–10.5)
Creatinine, Ser: 1.97 mg/dL — ABNORMAL HIGH (ref 0.50–1.35)
GFR calc non Af Amer: 34 mL/min — ABNORMAL LOW (ref >90)
Glucose, Bld: 93 mg/dL (ref 70–99)
Potassium: 3.9 mEq/L (ref 3.5–5.1)

## 2011-08-09 MED ORDER — FUROSEMIDE 20 MG PO TABS
20.0000 mg | ORAL_TABLET | Freq: Every day | ORAL | Status: DC
  Administered 2011-08-09 – 2011-08-10 (×2): 20 mg via ORAL
  Filled 2011-08-09 (×2): qty 1

## 2011-08-09 MED ORDER — COLCHICINE 0.6 MG PO TABS
0.6000 mg | ORAL_TABLET | Freq: Two times a day (BID) | ORAL | Status: DC
  Administered 2011-08-09 – 2011-08-19 (×20): 0.6 mg via ORAL
  Filled 2011-08-09 (×25): qty 1

## 2011-08-09 NOTE — Progress Notes (Signed)
   SUBJECTIVE:  Complaining of pain in the right great toe.  Breathing is better.     PHYSICAL EXAM Filed Vitals:   08/08/11 1342 08/08/11 1359 08/08/11 2000 08/09/11 0604  BP: 145/58 136/38 128/51 155/47  Pulse:  74 88 82  Temp:  97.9 F (36.6 C) 97.2 F (36.2 C) 97.9 F (36.6 C)  TempSrc:  Oral Oral Oral  Resp:   20 20  Height:      Weight:    174 lb 13.2 oz (79.3 kg)  SpO2:  96% 95% 97%   General:  No distress Lungs:  Clear Heart:  AI murmur, slight systolic murmur at the apex Abdomen:  Positive bowel sounds, no rebound no guarding Extremities:  No edema.   Intake/Output Summary (Last 24 hours) at 08/09/11 0836 Last data filed at 08/09/11 0749  Gross per 24 hour  Intake   1443 ml  Output   1120 ml  Net    323 ml    ASSESSMENT AND PLAN:  1)  RENAL INSUFFICIENCY, CHRONIC: Started low dose Lasix yesterday.  Creat is stable but follow closely.   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  I called Wed and got a recording without a number for a covering MD.  I will continue to try to make sure that he can be seen first thing next week.   3) Acute combined systolic and diastolic heart failure:  Weight is going up.  I started po diuretics back yesterday.  Follow the BMET closely.    4) HTN:  BP is better than previous.  I will continue the meds a listed.   Rollene Rotunda 08/09/2011 8:36 AM

## 2011-08-09 NOTE — Progress Notes (Signed)
Patient ID: Warren [name], male   DOB: 04/26/1946, 65 y.o.   MRN: [id number] This is an addendum to the rounding note.   CKD - Please note that the CKD is Stage IIII  Also,  I did get in touch with Dr. Mayford Knife who I was told was covering the dental service.  However, this was the wrong physician.  We will need to contact Dr. Kristin Bruins on Monday morning to have this patient seen for dental extraction.

## 2011-08-09 NOTE — Plan of Care (Deleted)
Problem: Discharge Progression Outcomes Goal: Able to perform self care activities Outcome: Adequate for Discharge Needs plenty of assistance and will have HHRN, OT and PT daughter will help but works full time. Goal: Activity appropriate for discharge plan Outcome: Adequate for Discharge Mostly bed to chair and back using front wheeled walker

## 2011-08-09 NOTE — Progress Notes (Signed)
CSW met with patient at bedside. CSW reviewed all CHF management guidelines with the patient. The pt was generally un-cooperative and reported that he would not be following any of the diet restrictions. 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 Pt reported living at home alone but gets help from friends and his daughter. Pt did have interest in home health RN, PT and OT.  These orders have been requested.. Pt reported no symptoms of depression at this time.  CSW completed the psychosocial assessment which can be found in the shadow chart. Clinical Social Work will sign off for now as social work intervention is no longer needed. Please consult [place] again if new need arises.

## 2011-08-10 ENCOUNTER — Inpatient Hospital Stay (HOSPITAL_COMMUNITY): Payer: Medicare Other

## 2011-08-10 DIAGNOSIS — I059 Rheumatic mitral valve disease, unspecified: Secondary | ICD-10-CM

## 2011-08-10 LAB — BASIC METABOLIC PANEL
BUN: 33 mg/dL — ABNORMAL HIGH (ref 6–23)
Calcium: 9.3 mg/dL (ref 8.4–10.5)
Chloride: 100 mEq/L (ref 96–112)
Creatinine, Ser: 2.1 mg/dL — ABNORMAL HIGH (ref 0.50–1.35)
GFR calc Af Amer: 36 mL/min — ABNORMAL LOW (ref >90)

## 2011-08-10 LAB — DIFFERENTIAL
Basophils Absolute: 0 10*3/uL (ref 0.0–0.1)
Eosinophils Absolute: 0.2 10*3/uL (ref 0.0–0.7)
Eosinophils Relative: 3 % (ref 0–5)
Monocytes Absolute: 0.6 10*3/uL (ref 0.1–1.0)

## 2011-08-10 LAB — CBC
HCT: 35.6 % — ABNORMAL LOW (ref 39.0–52.0)
MCH: 24.6 pg — ABNORMAL LOW (ref 26.0–34.0)
MCV: 78.8 fL (ref 78.0–100.0)
Platelets: 207 10*3/uL (ref 150–400)
RDW: 15.9 % — ABNORMAL HIGH (ref 11.5–15.5)

## 2011-08-10 MED ORDER — MOXIFLOXACIN HCL IN NACL 400 MG/250ML IV SOLN
400.0000 mg | INTRAVENOUS | Status: DC
  Administered 2011-08-10: 400 mg via INTRAVENOUS
  Filled 2011-08-10 (×2): qty 250

## 2011-08-10 MED ORDER — FUROSEMIDE 10 MG/ML IJ SOLN
40.0000 mg | Freq: Every day | INTRAMUSCULAR | Status: DC
  Filled 2011-08-10: qty 4

## 2011-08-10 MED ORDER — HYDRALAZINE HCL 50 MG PO TABS
50.0000 mg | ORAL_TABLET | Freq: Three times a day (TID) | ORAL | Status: DC
  Administered 2011-08-10 – 2011-08-28 (×51): 50 mg via ORAL
  Filled 2011-08-10 (×57): qty 1

## 2011-08-10 MED ORDER — FUROSEMIDE 10 MG/ML IJ SOLN
20.0000 mg | Freq: Once | INTRAMUSCULAR | Status: AC
  Administered 2011-08-10: 20 mg via INTRAVENOUS
  Filled 2011-08-10: qty 2

## 2011-08-10 NOTE — Progress Notes (Signed)
Patient ID: Warren [name], male   DOB: 10/02/1945, 65 y.o.   MRN: [id number]    SUBJECTIVE: Occasional dyspnea, doing ok for the most part.  Lasix restarted yesterday at low dose.      Marland Kitchen amLODipine  5 mg Oral Daily  . aspirin  81 mg Oral Daily  . aspirin  81 mg Oral Daily  . colchicine  0.6 mg Oral BID  . feeding supplement  237 mL Oral BID  . feeding supplement  1 Container Oral BID BM  . furosemide  20 mg Oral Daily  . heparin  5,000 Units Subcutaneous Q8H  . hydrALAZINE  25 mg Oral Q8H  . labetalol  200 mg Oral BID  . potassium chloride  20 mEq Oral Daily  . sertraline  150 mg Oral Daily  . sodium chloride  3 mL Intravenous Q12H  . sodium chloride  3 mL Intravenous Q12H  . sodium chloride  3 mL Intravenous Q12H  . DISCONTD: furosemide  40 mg Oral Daily      Filed Vitals:   08/09/11 1139 08/09/11 1433 08/09/11 2123 08/10/11 0443  BP:  122/48 142/55 145/53  Pulse: 80 75 80 80  Temp:  97.4 F (36.3 C) 97.6 F (36.4 C) 97.5 F (36.4 C)  TempSrc:  Oral    Resp:  20 16 24   Height:      Weight:    81.2 kg (179 lb 0.2 oz)  SpO2:  96% 93% 91%    Intake/Output Summary (Last 24 hours) at 08/10/11 0748 Last data filed at 08/09/11 1825  Gross per 24 hour  Intake    960 ml  Output    395 ml  Net    565 ml    LABS: Basic Metabolic Panel:  Basename 08/10/11 0600 08/09/11 0532  NA 134* 136  K 4.1 3.9  CL 100 102  CO2 23 25  GLUCOSE 110* 93  BUN 33* 26*  CREATININE 2.10* 1.97*  CALCIUM 9.3 9.1  MG -- --  PHOS -- --   RADIOLOGY: No results found.  PHYSICAL EXAM General: NAD Neck: JVP about 8 cm, no thyromegaly or thyroid nodule.  Lungs: Clear to auscultation bilaterally with normal respiratory effort. CV: Nondisplaced PMI.  Heart regular S1/S2, no S3/S4, 3/6 HSM at apex, 3/6 diastolic murmur along sternal border.  No peripheral edema.  No carotid bruit.  Normal pedal pulses.  Abdomen: Soft, nontender, no hepatosplenomegaly, no distention.  Neurologic: Alert  and oriented x 3.  Psych: Normal affect. Extremities: No clubbing or cyanosis.   ASSESSMENT AND PLAN:  65 yo with history of Type I aortic dissection repair was admitted to [facility] with CHF symptoms and severe AI/MR on echo.  Left heart cath with no CAD. He is stable today.  Weight has increased, he was restarted on Lasix.  1. Valvular heart disease: Needs dental consult (Monday), awaiting valve replacement after that.  Will increase hydralazine to 50 mg tid for afterload reduction.  2. CKD: Creatinine up a bit.  Will not increase Lasix today.  3. CHF: Probably mild volume overload.  EF 50-55% with severe MR and severe AI.  Will leave on current Lasix dose with rise in creatinine but will need to follow closely.   [name] 08/10/2011 7:53 AM

## 2011-08-10 NOTE — Progress Notes (Signed)
Patient c/o SOB .  SOB with minimal exertion.  Vital signs 97.5-94-24 92%.  O2 via nasal cannula at 2L nasal cannula.  Rapid Response called and responded to issue.  Client instructed on how to utilize incenitive spirometry.  HOB elevated.Marland Kitchen

## 2011-08-10 NOTE — Progress Notes (Signed)
Call from RN this am with patient c/o dyspnea.  VS were stable.  CXR ordered with following results.  IMPRESSION:  1. Suspect congestive heart failure pattern with moderate right- sided pleural effusion. Airspace disease is asymmetric, right greater than left. Right-sided pneumonia cannot be excluded by imaging. Suggest clinical correlation. 2. Cardiac silhouette is larger on today's study compared to prior chest or chest radiograph July 2010, suggesting interval development of cardiomegaly versus pericardial effusion.   I will give Lasix 20 mg IV now and change daily Lasix to 40 mg IV Check BMET in AM Get CBC to assess WBC Start Avelox IV 400 mg IV q 24 hours Tereso Newcomer, PA-C  1:56 PM 08/10/2011

## 2011-08-10 NOTE — Significant Event (Signed)
Rapid Response Event Note  Overview: Time Called: 1105 Arrival Time: 1115 Event Type: Respiratory  Initial Focused Assessment: Patient Complaint of Shortness of Breath.  Lung sounds with scattered crackles and decreased Right base.  Heart tones with Murmur. VSS  SOB aggravated by any exertion.   Interventions: RN giving am Lasix and notifying MD. Encouraged Cough and deep breathing.  IS instruction. Orders received. RN to call if assistance needed.  Event Summary: Name of Physician Notified: Tereso Newcomer, PA at      at    Outcome: Stayed in room and stabalized  Event End Time: 1200  Warren [name]

## 2011-08-11 LAB — BASIC METABOLIC PANEL
CO2: 22 mEq/L (ref 19–32)
Calcium: 9.1 mg/dL (ref 8.4–10.5)
Creatinine, Ser: 1.93 mg/dL — ABNORMAL HIGH (ref 0.50–1.35)
GFR calc non Af Amer: 35 mL/min — ABNORMAL LOW (ref >90)
Glucose, Bld: 88 mg/dL (ref 70–99)
Sodium: 136 mEq/L (ref 135–145)

## 2011-08-11 MED ORDER — AMLODIPINE BESYLATE 10 MG PO TABS
10.0000 mg | ORAL_TABLET | Freq: Every day | ORAL | Status: DC
  Administered 2011-08-11 – 2011-08-26 (×15): 10 mg via ORAL
  Filled 2011-08-11 (×16): qty 1

## 2011-08-11 MED ORDER — FUROSEMIDE 10 MG/ML IJ SOLN
40.0000 mg | Freq: Two times a day (BID) | INTRAMUSCULAR | Status: DC
  Administered 2011-08-11 – 2011-08-12 (×4): 40 mg via INTRAVENOUS
  Filled 2011-08-11 (×7): qty 4

## 2011-08-11 MED ORDER — MOXIFLOXACIN HCL 400 MG PO TABS
400.0000 mg | ORAL_TABLET | Freq: Every day | ORAL | Status: DC
  Administered 2011-08-11 – 2011-08-19 (×9): 400 mg via ORAL
  Filled 2011-08-11 (×10): qty 1

## 2011-08-11 MED ORDER — POTASSIUM CHLORIDE 20 MEQ/15ML (10%) PO LIQD
40.0000 meq | Freq: Once | ORAL | Status: AC
  Administered 2011-08-11: 40 meq via ORAL
  Filled 2011-08-11: qty 30

## 2011-08-11 NOTE — Progress Notes (Addendum)
Patient ID: Warren [name], male   DOB: Jan 04, 1946, 65 y.o.   MRN: [id number]     SUBJECTIVE: Breathing better with diuresis.  Comfortable this morning.  BP still running high.      Marland Kitchen amLODipine  5 mg Oral Daily  . aspirin  81 mg Oral Daily  . aspirin  81 mg Oral Daily  . colchicine  0.6 mg Oral BID  . feeding supplement  237 mL Oral BID  . feeding supplement  1 Container Oral BID BM  . furosemide  20 mg Intravenous Once  . furosemide  40 mg Intravenous Daily  . heparin  5,000 Units Subcutaneous Q8H  . hydrALAZINE  50 mg Oral Q8H  . labetalol  200 mg Oral BID  . moxifloxacin  400 mg Intravenous Q24H  . potassium chloride  20 mEq Oral Daily  . sertraline  150 mg Oral Daily  . sodium chloride  3 mL Intravenous Q12H  . sodium chloride  3 mL Intravenous Q12H  . sodium chloride  3 mL Intravenous Q12H  . DISCONTD: furosemide  20 mg Oral Daily      Filed Vitals:   08/10/11 1500 08/10/11 1508 08/10/11 2122 08/11/11 0551  BP: 138/52 152/52 151/57 158/57  Pulse: 80 82 82 85  Temp: 97.6 F (36.4 C) 98 F (36.7 C) 97.5 F (36.4 C) 97.7 F (36.5 C)  TempSrc: Oral Oral Oral Oral  Resp: 22 24 18 18   Height:      Weight:    80.1 kg (176 lb 9.4 oz)  SpO2: 95% 94% 95% 92%    Intake/Output Summary (Last 24 hours) at 08/11/11 0901 Last data filed at 08/11/11 0552  Gross per 24 hour  Intake    480 ml  Output   1950 ml  Net  -1470 ml    LABS: Basic Metabolic Panel:  Basename 08/11/11 0650 08/10/11 0600  NA 136 134*  K 3.7 4.1  CL 103 100  CO2 22 23  GLUCOSE 88 110*  BUN 30* 33*  CREATININE 1.93* 2.10*  CALCIUM 9.1 9.3  MG -- --  PHOS -- --   RADIOLOGY: No results found.  PHYSICAL EXAM General: NAD Neck: JVP about 10 cm, no thyromegaly or thyroid nodule.  Lungs: Clear to auscultation bilaterally with normal respiratory effort. CV: Nondisplaced PMI.  Heart regular S1/S2, no S3/S4, 3/6 HSM at apex, 3/6 diastolic murmur along sternal border.  No peripheral edema.   No carotid bruit.  Normal pedal pulses.  Abdomen: Soft, nontender, no hepatosplenomegaly, no distention.  Neurologic: Alert and oriented x 3.  Psych: Normal affect. Extremities: No clubbing or cyanosis.   ASSESSMENT AND PLAN:  65 yo with history of Type I aortic dissection repair was admitted to [facility] with CHF symptoms and severe AI/MR on echo.  Left heart cath with no CAD.   1. Valvular heart disease: Needs dental consult (Monday), awaiting valve replacement after that.  With CHF symptoms, will push afterload reduction (BP still high).  Increase amlodipine to 10 mg daily, continue hydralazine.  2. CKD: Creatinine stable with diuresis.  3. CHF: Acute diastolic CHF.  EF 50-55% with severe MR and severe AI.  Increase Lasix to 40 mg IV bid today.  Still volume overloaded.  4.  Possible PNA on CXR yesterday, moxifloxacin was started.  Suspect his symptoms were primarily due to CHF though.   [name] 08/11/2011 9:01 AM

## 2011-08-12 DIAGNOSIS — I5033 Acute on chronic diastolic (congestive) heart failure: Secondary | ICD-10-CM

## 2011-08-12 LAB — CBC
Hemoglobin: 10.9 g/dL — ABNORMAL LOW (ref 13.0–17.0)
MCH: 25.1 pg — ABNORMAL LOW (ref 26.0–34.0)
MCHC: 32 g/dL (ref 30.0–36.0)
Platelets: 203 10*3/uL (ref 150–400)
RDW: 16 % — ABNORMAL HIGH (ref 11.5–15.5)

## 2011-08-12 LAB — BASIC METABOLIC PANEL
BUN: 34 mg/dL — ABNORMAL HIGH (ref 6–23)
Calcium: 9.3 mg/dL (ref 8.4–10.5)
GFR calc Af Amer: 36 mL/min — ABNORMAL LOW (ref >90)
GFR calc non Af Amer: 31 mL/min — ABNORMAL LOW (ref >90)
Glucose, Bld: 88 mg/dL (ref 70–99)
Potassium: 3.7 mEq/L (ref 3.5–5.1)
Sodium: 139 mEq/L (ref 135–145)

## 2011-08-12 MED ORDER — TRAZODONE HCL 50 MG PO TABS
50.0000 mg | ORAL_TABLET | Freq: Every evening | ORAL | Status: DC | PRN
  Administered 2011-08-13 – 2011-08-15 (×2): 50 mg via ORAL
  Filled 2011-08-12 (×2): qty 1

## 2011-08-12 NOTE — Progress Notes (Addendum)
Subjective: Dyspnea on exertion, no chest pain. Trouble sleeping.   Objective: Temp:  [97.5 F (36.4 C)-97.9 F (36.6 C)] 97.8 F (36.6 C) (12/31 0528) Pulse Rate:  [73-82] 77  (12/31 0528) Resp:  [18] 18  (12/31 0528) BP: (120-148)/(45-59) 138/56 mmHg (12/31 0700) SpO2:  [95 %-97 %] 95 % (12/31 0528) Weight:  [172 lb 8 oz (78.245 kg)] 172 lb 8 oz (78.245 kg) (12/31 0528)  I/O last 3 completed shifts: In: 480 [P.O.:480] Out: 4500 [Urine:4500]  Telemetry - Sinus rhythm with PVC's.  Exam -   General - NAD.  Lungs - Decreased at bases, right greater than left, rhonchi.  Cardiac - RRR with 2-3/6 diastolic murmur left base, 2/6 systolic murmur at apex. No S3.  Abdomen - NABS.  Extremities - No pitting.  Testing -   Lab Results  Component Value Date   WBC 6.6 08/12/2011   HGB 10.9* 08/12/2011   HCT 34.1* 08/12/2011   MCV 78.6 08/12/2011   PLT 203 08/12/2011    Lab Results  Component Value Date   CREATININE 2.11* 08/12/2011   BUN 34* 08/12/2011   NA 139 08/12/2011   K 3.7 08/12/2011   CL 102 08/12/2011   CO2 25 08/12/2011   CXR 12/29: 1. Suspect congestive heart failure pattern with moderate right- sided pleural effusion. Airspace disease is asymmetric, right greater than left. Right-sided pneumonia cannot be excluded by imaging. Suggest clinical correlation. 2. Cardiac silhouette is larger on today's study compared to prior chest or chest radiograph July 2010, suggesting interval development of cardiomegaly versus pericardial effusion.   Current Medications    . amLODipine  10 mg Oral Daily  . aspirin  81 mg Oral Daily  . aspirin  81 mg Oral Daily  . colchicine  0.6 mg Oral BID  . feeding supplement  237 mL Oral BID  . feeding supplement  1 Container Oral BID BM  . furosemide  40 mg Intravenous BID  . heparin  5,000 Units Subcutaneous Q8H  . hydrALAZINE  50 mg Oral Q8H  . labetalol  200 mg Oral BID  . moxifloxacin  400 mg Oral q1800  . potassium  chloride  40 mEq Oral Once  . potassium chloride  20 mEq Oral Daily  . sertraline  150 mg Oral Daily  . sodium chloride  3 mL Intravenous Q12H  . sodium chloride  3 mL Intravenous Q12H  . sodium chloride  3 mL Intravenous Q12H    Assessment:  1. Severe aortic regurgitation (prolapsing NCC) and severe mitral regurgitation (prolapsing A2 segment). TEE report in hard chart - done at [facility]. TCTS has evaluated for valvular surgery.  2. Diastolic CHF secondary to problem #1. LVEF 50-55%.  3. Small PFO.  4. History of type 1 aortic dissection with repair 2010.  5. HTN, blood pressure trend somewhat better.  6. CKD, creatinine 2.1.  7. Poor dentition, awaits dental consult.  Plan:  Need dental consult. Continue medical therapy pending surgery possibly later this week. Followup BMET AM. Patient asked for higher dose Ambien, however dose is limited by his age. Can try PRN Trazodone for sleep.   Jonelle Sidle, M.D., F.A.C.C.

## 2011-08-12 NOTE — Progress Notes (Signed)
UR Completed.  Warren [name] 08/12/2011 [phone]  

## 2011-08-13 DIAGNOSIS — I059 Rheumatic mitral valve disease, unspecified: Secondary | ICD-10-CM

## 2011-08-13 DIAGNOSIS — N179 Acute kidney failure, unspecified: Secondary | ICD-10-CM

## 2011-08-13 DIAGNOSIS — I359 Nonrheumatic aortic valve disorder, unspecified: Secondary | ICD-10-CM

## 2011-08-13 LAB — BASIC METABOLIC PANEL
CO2: 29 mEq/L (ref 19–32)
Chloride: 101 mEq/L (ref 96–112)
Creatinine, Ser: 2.08 mg/dL — ABNORMAL HIGH (ref 0.50–1.35)
GFR calc Af Amer: 37 mL/min — ABNORMAL LOW (ref >90)
Potassium: 3.4 mEq/L — ABNORMAL LOW (ref 3.5–5.1)
Sodium: 139 mEq/L (ref 135–145)

## 2011-08-13 LAB — PRO B NATRIURETIC PEPTIDE: Pro B Natriuretic peptide (BNP): 15017 pg/mL — ABNORMAL HIGH (ref 0–125)

## 2011-08-13 LAB — CBC
Platelets: 198 10*3/uL (ref 150–400)
RBC: 4.34 MIL/uL (ref 4.22–5.81)
RDW: 16 % — ABNORMAL HIGH (ref 11.5–15.5)
WBC: 6.6 10*3/uL (ref 4.0–10.5)

## 2011-08-13 MED ORDER — FUROSEMIDE 10 MG/ML IJ SOLN
40.0000 mg | Freq: Once | INTRAMUSCULAR | Status: AC
  Administered 2011-08-13: 40 mg via INTRAVENOUS
  Filled 2011-08-13: qty 4

## 2011-08-13 NOTE — Progress Notes (Signed)
8 Days Post-Op Procedure(s) (LRB): LEFT HEART CATHETERIZATION WITH CORONARY ANGIOGRAM (N/A) Subjective: Feels pretty good, denies SOB  Objective: Vital signs in last 24 hours: Temp:  [97.4 F (36.3 C)-98.2 F (36.8 C)] 98.2 F (36.8 C) (01/01 0601) Pulse Rate:  [71-80] 80  (01/01 0601) Cardiac Rhythm:  [-] Normal sinus rhythm (01/01 0015) Resp:  [18] 18  (01/01 0601) BP: (120-140)/(38-56) 140/56 mmHg (01/01 0601) SpO2:  [97 %-99 %] 99 % (01/01 0601)  Hemodynamic parameters for last 24 hours:    Intake/Output from previous day: 12/31 0701 - 01/01 0700 In: 247 [P.O.:240; I.V.:3; IV Piggyback:4] Out: 1950 [Urine:1950] Intake/Output this shift: Total I/O In: 240 [P.O.:240] Out: 400 [Urine:400]  unchanged  Lab Results:  Basename 08/13/11 0700 08/12/11 0500  WBC 6.6 6.6  HGB 10.5* 10.9*  HCT 33.8* 34.1*  PLT 198 203   BMET:  Basename 08/13/11 0940 08/12/11 0500  NA 139 139  K 3.4* 3.7  CL 101 102  CO2 29 25  GLUCOSE 108* 88  BUN 34* 34*  CREATININE 2.08* 2.11*  CALCIUM 9.3 9.3    PT/INR: No results found for this basename: LABPROT,INR in the last 72 hours ABG    Component Value Date/Time   PHART 7.508* 10/22/2008 1345   HCO3 20.0 10/22/2008 1345   TCO2 28 10/24/2008 1805   ACIDBASEDEF 2.0 10/22/2008 1345   O2SAT 99.0 10/22/2008 1345   CBG (last 3)  No results found for this basename: GLUCAP:3 in the last 72 hours  Assessment/Plan: S/P Procedure(s) (LRB): LEFT HEART CATHETERIZATION WITH CORONARY ANGIOGRAM (N/A) - AI/MR, acute on chronic CHF- he's clinically stable even with proBNP > 15000. Renal failure creatinine stable Still hasn't been seen by dental- will call office again in AM   LOS: 11 days    Waver Dibiasio C 08/13/2011

## 2011-08-13 NOTE — Progress Notes (Addendum)
SUBJECTIVE:Breathing better. No chest pain. Slept better.  Active Problems:  RENAL INSUFFICIENCY, CHRONIC  Aortic regurgitation  Acute on chronic diastolic heart failure  Mitral regurgitation   LABS: Basic Metabolic Panel:  Basename 08/12/11 0500 08/11/11 0650  NA 139 136  K 3.7 3.7  CL 102 103  CO2 25 22  GLUCOSE 88 88  BUN 34* 30*  CREATININE 2.11* 1.93*  CALCIUM 9.3 9.1  MG -- --  PHOS -- --   CBC:  Basename 08/12/11 0500 08/10/11 1430  WBC 6.6 7.6  NEUTROABS -- 6.2  HGB 10.9* 11.1*  HCT 34.1* 35.6*  MCV 78.6 78.8  PLT 203 207   RADIOLOGY:CXR 08/10/2011 1. Suspect congestive heart failure pattern with moderate right- sided pleural effusion. Airspace disease is asymmetric, right greater than left. Right-sided pneumonia cannot be excluded by imaging. Suggest clinical correlation. 2. Cardiac silhouette is larger on today's study compared to prior chest or chest radiograph July 2010, suggesting interval development of cardiomegaly versus pericardial effusion.  ECHO 02/10/2009 Study Conclusions  1. Left ventricle: The cavity size was normal. Wall thickness was increased in a pattern of mild LVH. Systolic function was normal. The estimated ejection fraction was in the range of 55% to 60%. Wall motion was normal; there were no regional wall motion abnormalities. Doppler parameters are consistent with abnormal left ventricular relaxation (grade 1 diastolic dysfunction). 2. Aortic valve: Poorly visualized. Pressure half time 378 msec. Moderate regurgitation. 3. Aorta: The patient appears to be status surgical replacement of the aortic root and ascending aorta. This region is not well-visualized on this study. 4. Mitral valve: Mild regurgitation. 5. Pulmonary arteries: PA peak pressure: 37mm Hg (S). 6. Pericardium, extracardiac: The pericardium was normal in  Severe aortic regurgitation (prolapsing NCC) and severe mitral regurgitation (prolapsing A2 segment). TEE  report in hard chart - done at [facility]. TCTS has evaluated for valvular surgery.     PHYSICAL EXAM BP 140/56  Pulse 80  Temp(Src) 98.2 F (36.8 C) (Oral)  Resp 18  Ht 6\' 3"  (1.905 m)  Wt 172 lb 8 oz (78.245 kg)  BMI 21.56 kg/m2  SpO2 99% (Admit wt: 175 79.7 kg.  Max wt 202 lbs 91.9 kg) General: Well developed, well nourished, in no acute distress Head: Eyes PERRLA, No xanthomas.   Normal cephalic and atramatic  Lungs: Clear bilaterally to auscultation and percussion. Heart: HRRR occasional extra systole. 2/6 systolic murmur at the RSB and diastolic murmur at the apex..  Pulses are 2+ & equal.            No carotid bruit. No JVD.  No abdominal bruits. No femoral bruits. Abdomen: Bowel sounds are positive, abdomen soft and non-tender without masses or                  Hernia's noted. Msk:  Back normal, normal gait. Normal strength and tone for age. Extremities: No clubbing, cyanosis or edema.  DP +1 Neuro: Alert and oriented X 3. Psych:  Good affect, responds appropriately  TELEMETRY: Reviewed telemetry pt in SR with PVC's  ASSESSMENT AND PLAN: 1. 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 Sunday Corn note. Needs dental consult prior to surgery. Do not see cath report. Plan for surgery this week per CVTS.   2. Diastolic CHF: Lungs are clear this am, no edema is noted. Wt down to 78.2 kg, from admission wt of 79.7 kg. Remains on lasix 40 mg IV BID. Creatinine not available this am yet. Consider  changing to po once labs are evaluated. Uncertain of dry wt. Check BMET and BNP this am.  3. Hypertension: Moderately controlled on amlodipine that was increased to 10 mg yesterday. On two other antihypertensives.  Continue to monitor this.  Prefer BP to be in the 130's systolic. Will follow.    Bettey Mare. Lyman Bishop NP [facility] 08/13/2011, 7:04 AM   Attending note:  Patient seen and examined. He reports no chest pain or dyspnea, slept better last  night. Still awaits dental consult - called yesterday.  On exam he is comfortable, decreased BS bases right greater than left, RRR with 2-3/6 diastolic murmur left base, 2/6 systolic murmur at apex. No S3. No pitting.  Creatinine 1.9 to 2.1, Hgb 10.5. Out greater than in.  Will decrease Lasix to 40 mg IV daily.  Await dental consult then TCTS for valve surgery.  Jonelle Sidle, M.D., F.A.C.C.

## 2011-08-14 ENCOUNTER — Inpatient Hospital Stay (HOSPITAL_COMMUNITY): Payer: Medicare Other

## 2011-08-14 LAB — BASIC METABOLIC PANEL
BUN: 38 mg/dL — ABNORMAL HIGH (ref 6–23)
Chloride: 98 mEq/L (ref 96–112)
GFR calc non Af Amer: 29 mL/min — ABNORMAL LOW (ref >90)
Glucose, Bld: 129 mg/dL — ABNORMAL HIGH (ref 70–99)
Potassium: 3.8 mEq/L (ref 3.5–5.1)
Sodium: 134 mEq/L — ABNORMAL LOW (ref 135–145)

## 2011-08-14 LAB — CBC
HCT: 34 % — ABNORMAL LOW (ref 39.0–52.0)
Hemoglobin: 10.6 g/dL — ABNORMAL LOW (ref 13.0–17.0)
MCHC: 31.2 g/dL (ref 30.0–36.0)
RBC: 4.34 MIL/uL (ref 4.22–5.81)
WBC: 6.7 10*3/uL (ref 4.0–10.5)

## 2011-08-14 NOTE — Progress Notes (Signed)
Patient Name: Warren [name] Date of Encounter: 08/14/2011     Active Problems:  RENAL INSUFFICIENCY, CHRONIC  Aortic regurgitation  Acute on chronic diastolic heart failure  Mitral regurgitation    SUBJECTIVE: Pt laying in bed comfortably having recently received Morphine IV for dental pain. States he is a bit drowsy. Denies chest pain, SOB, orthopnea, LE edema, cough, n/v, abdominal pain, diaphoresis, lightheadedness and palpitations.    OBJECTIVE  Filed Vitals:   08/13/11 2254 08/14/11 0612 08/14/11 1025 08/14/11 1411  BP: 128/60 125/40 114/35 115/32  Pulse: 74 68 73 53  Temp: 98.7 F (37.1 C) 97.9 F (36.6 C)  97.5 F (36.4 C)  TempSrc: Oral Oral  Oral  Resp: 20 19  18   Height:      Weight:  77.9 kg (171 lb 11.8 oz)    SpO2: 98% 96%  93%    Intake/Output Summary (Last 24 hours) at 08/14/11 1533 Last data filed at 08/14/11 1332  Gross per 24 hour  Intake   1050 ml  Output   1300 ml  Net   -250 ml   Weight change:   PHYSICAL EXAM  General: Dishevaled, NAD Head: Normocephalic, atraumatic, sclera non-icteric, no xanthomas, halitosis  Neck: Supple without bruits or JVD. Lungs:  Resp regular and unlabored, CTAB, no wheezes, rales or rhonchi Heart: RRR, III/VI crescendo-decrescendo systolic murmur at RLSB, early diastolic murmur at LLSB, both increasing on inspiration, no S3, S4 Abdomen: Soft, non-tender, non-distended, BS + x 4.  Msk:  Strength and tone appears normal for age. Extremities: No clubbing, cyanosis or edema. DP/PT/Radials 2+ and equal bilaterally. Neuro: Alert and oriented X 3. Moves all extremities spontaneously. Psych: Normal affect.  LABS:  Recent Labs  Basename 08/14/11 0602 08/13/11 0700   WBC 6.7 6.6   HGB 10.6* 10.5*   HCT 34.0* 33.8*   MCV 78.3 77.9*   PLT 204 198    Lab 08/13/11 0940 08/12/11 0500 08/11/11 0650  NA 139 139 136  K 3.4* 3.7 3.7  CL 101 102 103  CO2 29 25 22   BUN 34* 34* 30*  CREATININE 2.08* 2.11* 1.93*    CALCIUM 9.3 9.3 9.1  PROT -- -- --  BILITOT -- -- --  ALKPHOS -- -- --  ALT -- -- --  AST -- -- --  AMYLASE -- -- --  LIPASE -- -- --  GLUCOSE 108* 88 88   TELE: NSR, 60-80 bpm, trace PAC/PVCs  ECG: None   Radiology/Studies:  Dg Orthopantogram  08/14/2011  *RADIOLOGY REPORT*  Clinical Data:  Poor dentition.  Pre heart valve replacement evaluation.  Exam: Orthopantogram  Comparison: Head CT dated 11/06/2008.  Findings: Multiple missing upper and lower teeth bilaterally. There is a large cavity in a lateral left upper tooth.  A significant portion of the adjacent more posterior tooth is absent and the roots are free floating (not within the bone).  This is in an area of periapical erosion on the previous CT.  IMPRESSION: Large cavity in a remaining left upper tooth and absence of a large portion of the adjacent more posterior tooth with floating roots.  Original Report Authenticated By: Darrol Angel, M.D.   Dg Chest Port 1 View  08/10/2011  *RADIOLOGY REPORT*  Clinical Data: Shortness of breath  PORTABLE CHEST - 1 VIEW  Comparison: 07/04/2010and 02/11/2009  Findings: There are changes of median sternotomy. The cardiac silhouette is increased in size compared to the prior portable chest radiograph of July 2010.  Findings may reflect interval enlargement of the heart, or development of pericardial effusion. There is a moderate sized right pleural effusion.  Right perihilar and right basilar airspace disease is present.  There are patchy opacities at the left lung base.  Mild pulmonary vascular congestion.  IMPRESSION:  1.  Suspect congestive heart failure pattern with moderate right- sided pleural effusion.  Airspace disease is asymmetric, right greater than left.  Right-sided pneumonia cannot be excluded by imaging.  Suggest clinical correlation. 2.  Cardiac silhouette is larger on today's study compared to prior chest or chest radiograph July 2010, suggesting interval development of  cardiomegaly versus pericardial effusion.  Original Report Authenticated By: [name], M.D.    Current Medications:     . amLODipine  10 mg Oral Daily  . aspirin  81 mg Oral Daily  . colchicine  0.6 mg Oral BID  . feeding supplement  237 mL Oral BID  . feeding supplement  1 Container Oral BID BM  . heparin  5,000 Units Subcutaneous Q8H  . hydrALAZINE  50 mg Oral Q8H  . labetalol  200 mg Oral BID  . moxifloxacin  400 mg Oral q1800  . potassium chloride  20 mEq Oral Daily  . sertraline  150 mg Oral Daily  . sodium chloride  3 mL Intravenous Q12H    ASSESSMENT AND PLAN:  1. Severe AI/MR- plan for AVR, MVR. Orthopantogram performed today revealing the above details. Pt still to be evaluated by dental, called today and he is scheduled for evaluation tomorrow. Will continue to monitor and manage CHF in the interim.  2. Acute on chronic diastolic CHF- pt denies SOB, LE edema, lightheadedness, cough, orthopnea today; not fluid overloaded on exam; Lasix held yesterday due to achievement of baseline fluid status and Cr elevation.  I/O stable today. No BMET ordered.  - Will order BMET  - Will continue to follow fluid status 3. HTN- well-controlled today   4. Dental pain- continue Morphine IV PRN 5. Insomnia- on Ambien, Trazodone and Xanax PRN; states he is on higher dose of Ambien outpatient; however, given age, I am hesitant to increase this due to his age and in the setting of polypharmacy for pain and sleep.  Signed, R. Hurman Horn, PA-C 08/14/2011, 3:33 PM  Patient seen and examined.  He is stable at present.  He is not particularly short of breath.  Murmur is obvious.  Waiting on dental medicine.  Lungs are clear.  Will need to recheck BMET.  Helyn Schwan Stuckey6:51 PM 08/14/2011

## 2011-08-15 DIAGNOSIS — Z954 Presence of other heart-valve replacement: Secondary | ICD-10-CM

## 2011-08-15 DIAGNOSIS — K0530 Chronic periodontitis, unspecified: Secondary | ICD-10-CM | POA: Diagnosis present

## 2011-08-15 DIAGNOSIS — I359 Nonrheumatic aortic valve disorder, unspecified: Secondary | ICD-10-CM

## 2011-08-15 DIAGNOSIS — K0401 Reversible pulpitis: Secondary | ICD-10-CM | POA: Diagnosis present

## 2011-08-15 LAB — BASIC METABOLIC PANEL
BUN: 40 mg/dL — ABNORMAL HIGH (ref 6–23)
CO2: 26 mEq/L (ref 19–32)
Chloride: 99 mEq/L (ref 96–112)
GFR calc Af Amer: 32 mL/min — ABNORMAL LOW (ref >90)
Potassium: 3.9 mEq/L (ref 3.5–5.1)

## 2011-08-15 MED ORDER — FUROSEMIDE 40 MG PO TABS
40.0000 mg | ORAL_TABLET | Freq: Every day | ORAL | Status: DC
  Administered 2011-08-15 – 2011-08-18 (×4): 40 mg via ORAL
  Filled 2011-08-15 (×7): qty 1

## 2011-08-15 MED ORDER — CEFAZOLIN SODIUM-DEXTROSE 2-3 GM-% IV SOLR
2.0000 g | INTRAVENOUS | Status: AC
  Administered 2011-08-19: 2 g via INTRAVENOUS
  Filled 2011-08-15 (×2): qty 50

## 2011-08-15 NOTE — Progress Notes (Signed)
Patient ID: Warren [name], male   DOB: [birth year], 66 y.o.   MRN: [id number] Patient ID: Warren [name], male   DOB: 1945/11/03, 66 y.o.   MRN: [id number]     SUBJECTIVE: Denies CP or SOB     . amLODipine  10 mg Oral Daily  . aspirin  81 mg Oral Daily  . colchicine  0.6 mg Oral BID  . feeding supplement  237 mL Oral BID  . feeding supplement  1 Container Oral BID BM  . heparin  5,000 Units Subcutaneous Q8H  . hydrALAZINE  50 mg Oral Q8H  . labetalol  200 mg Oral BID  . moxifloxacin  400 mg Oral q1800  . potassium chloride  20 mEq Oral Daily  . sertraline  150 mg Oral Daily  . sodium chloride  3 mL Intravenous Q12H      Filed Vitals:   08/14/11 1411 08/14/11 2102 08/15/11 0421 08/15/11 0605  BP: 115/32 121/37 131/37 138/50  Pulse: 53 72 70   Temp: 97.5 F (36.4 C) 97.9 F (36.6 C) 97.7 F (36.5 C)   TempSrc: Oral     Resp: 18 18 18    Height:      Weight:   173 lb 11.6 oz (78.8 kg)   SpO2: 93% 97% 97%     Intake/Output Summary (Last 24 hours) at 08/15/11 0728 Last data filed at 08/15/11 0600  Gross per 24 hour  Intake    565 ml  Output   1225 ml  Net   -660 ml    LABS: Basic Metabolic Panel:  Basename 08/14/11 1650 08/13/11 0940  NA 134* 139  K 3.8 3.4*  CL 98 101  CO2 26 29  GLUCOSE 129* 108*  BUN 38* 34*  CREATININE 2.22* 2.08*  CALCIUM 9.2 9.3  MG -- --  PHOS -- --   RADIOLOGY: No results found.  PHYSICAL EXAM General: NAD Neck: supple Lungs: Clear to auscultation bilaterally with normal respiratory effort. CV: Heart regular S1/S2, no S3/S4, 3/6 HSM at apex, 3/6 diastolic murmur along sternal border.  No peripheral edema.  Abdomen: Soft, nontender, no hepatosplenomegaly, no distention.  Neurologic: Alert and oriented x 3.  Psych: Normal affect. Extremities: No clubbing or cyanosis.   ASSESSMENT AND PLAN:  66 yo with history of Type I aortic dissection repair was admitted to [facility] with CHF symptoms and severe AI/MR on echo.  Left  heart cath with no CAD.   1. Valvular heart disease: Awaiting dental consult, awaiting valve replacement after that.  With CHF symptoms, will push afterload reduction (BP still high).  2. CKD: Creatinine stable with diuresis. Resume lasix at 40 mg daily and recheck BMET in AM 3. CHF: Acute diastolic CHF.  EF 50-55% with severe MR and severe AI.  Increase Lasix to 40 mg IV bid today.  Still volume overloaded.    [name] 08/15/2011 7:28 AM

## 2011-08-15 NOTE — Progress Notes (Signed)
Nutrition Follow-up  Diet Order:  Heart Healthy  Meds: Scheduled Meds:   . amLODipine  10 mg Oral Daily  . aspirin  81 mg Oral Daily  . colchicine  0.6 mg Oral BID  . feeding supplement  237 mL Oral BID  . feeding supplement  1 Container Oral BID BM  . furosemide  40 mg Oral Daily  . heparin  5,000 Units Subcutaneous Q8H  . hydrALAZINE  50 mg Oral Q8H  . labetalol  200 mg Oral BID  . moxifloxacin  400 mg Oral q1800  . potassium chloride  20 mEq Oral Daily  . sertraline  150 mg Oral Daily  . sodium chloride  3 mL Intravenous Q12H   Continuous Infusions:  PRN Meds:.sodium chloride, acetaminophen, ALPRAZolam, HYDROcodone-acetaminophen, morphine injection, ondansetron (ZOFRAN) IV, sodium chloride, traZODone, zolpidem  Labs:  CMP     Component Value Date/Time   NA 138 08/15/2011 0550   K 3.9 08/15/2011 0550   CL 99 08/15/2011 0550   CO2 26 08/15/2011 0550   GLUCOSE 103* 08/15/2011 0550   BUN 40* 08/15/2011 0550   CREATININE 2.33* 08/15/2011 0550   CREATININE 2.19* 06/26/2008 2130   CALCIUM 9.2 08/15/2011 0550   PROT 6.0 08/06/2011 2212   ALBUMIN 3.4* 08/06/2011 2212   AST 15 08/06/2011 2212   ALT 11 08/06/2011 2212   ALKPHOS 115 08/06/2011 2212   BILITOT 1.0 08/06/2011 2212   GFRNONAA 28* 08/15/2011 0550   GFRAA 32* 08/15/2011 0550     Intake/Output Summary (Last 24 hours) at 08/15/11 1237 Last data filed at 08/15/11 0859  Gross per 24 hour  Intake    600 ml  Output   1550 ml  Net   -950 ml   Patient reports drinking/eating supplements most of the time. If patient is consuming multiple supplements daily he may have a decreased appetite at meal times. Patient states "I am fine"   Weight Status: 173 lbs, down from last RD follow up, likely related to fluid, current weight is 2 lb less then admission weight  Nutrition Dx:  Sub-optimal intake improving  Goal:  PO intake to be >75% still, unmet  Intervention:  No new intervention at this time. Will continue with all supplements.    Monitor:  PO intake, weight, labs, I/O's   Rudean Haskell Pager #:  [phone]
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
## 2011-08-16 LAB — BASIC METABOLIC PANEL
CO2: 26 mEq/L (ref 19–32)
Chloride: 100 mEq/L (ref 96–112)
Creatinine, Ser: 2.33 mg/dL — ABNORMAL HIGH (ref 0.50–1.35)
Glucose, Bld: 92 mg/dL (ref 70–99)
Sodium: 134 mEq/L — ABNORMAL LOW (ref 135–145)

## 2011-08-16 NOTE — Progress Notes (Signed)
UR Completed.  Warren [name] 08/16/2011 [phone]  

## 2011-08-16 NOTE — Progress Notes (Signed)
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 He also has severe dyspnea with any exertion. His volume status is tenuous and we believe that he requires volume monitoring, cardiac monitoring and daily assessments by cardiology to make sure that his medical condition is optimized before surgery. [name]

## 2011-08-16 NOTE — Progress Notes (Signed)
Patient ID: Warren [name], male   DOB: 16-Apr-1946, 66 y.o.   MRN: [id number] Patient ID: Warren [name], male   DOB: Jul 27, 1946, 66 y.o.   MRN: [id number] Patient ID: Warren [name], male   DOB: [birth year], 66 y.o.   MRN: [id number]     SUBJECTIVE: Denies CP; has DOE     . amLODipine  10 mg Oral Daily  . aspirin  81 mg Oral Daily  . ceFAZolin (ANCEF) IV  2 g Intravenous 60 min Pre-Op  . colchicine  0.6 mg Oral BID  . feeding supplement  237 mL Oral BID  . feeding supplement  1 Container Oral BID BM  . furosemide  40 mg Oral Daily  . heparin  5,000 Units Subcutaneous Q8H  . hydrALAZINE  50 mg Oral Q8H  . labetalol  200 mg Oral BID  . moxifloxacin  400 mg Oral q1800  . potassium chloride  20 mEq Oral Daily  . sertraline  150 mg Oral Daily  . sodium chloride  3 mL Intravenous Q12H      Filed Vitals:   08/15/11 0944 08/15/11 1300 08/15/11 2114 08/16/11 0521  BP:  130/57 113/46 140/45  Pulse: 75 77 76 79  Temp:  97.7 F (36.5 C) 97.9 F (36.6 C) 97.4 F (36.3 C)  TempSrc:  Oral    Resp:  20 20 20   Height:      Weight:    171 lb 1.2 oz (77.6 kg)  SpO2:  96% 95% 93%    Intake/Output Summary (Last 24 hours) at 08/16/11 0715 Last data filed at 08/16/11 0500  Gross per 24 hour  Intake    645 ml  Output   2000 ml  Net  -1355 ml    LABS: Basic Metabolic Panel:  Basename 08/15/11 0550 08/14/11 1650  NA 138 134*  K 3.9 3.8  CL 99 98  CO2 26 26  GLUCOSE 103* 129*  BUN 40* 38*  CREATININE 2.33* 2.22*  CALCIUM 9.2 9.2  MG -- --  PHOS -- --   RADIOLOGY: No results found.  PHYSICAL EXAM General: NAD Neck: supple Lungs: Clear to auscultation bilaterally with normal respiratory effort. CV: Heart regular S1/S2, no S3/S4, 3/6 HSM at apex, 3/6 diastolic murmur along sternal border.  No peripheral edema.  Abdomen: Soft, nontender, no hepatosplenomegaly, no distention.  Neurologic: Alert and oriented x 3.  Psych: Normal affect. Extremities: No clubbing or  cyanosis.   ASSESSMENT AND PLAN:  66 yo with history of Type I aortic dissection repair was admitted to [facility] with CHF symptoms and severe AI/MR on echo.  Left heart cath with no CAD.   1. Valvular heart disease: Dental surgery Monday and valve replacement after that.  2. CKD: Creatinine stable with diuresis. Continue lasix at 40 mg daily. 3. CHF: Acute diastolic CHF.  EF 50-55% with severe MR and severe AI.    [name] 08/16/2011 7:15 AM

## 2011-08-16 NOTE — Progress Notes (Signed)
11 Days Post-Op Procedure(s) (LRB): LEFT HEART CATHETERIZATION WITH CORONARY ANGIOGRAM (N/A) Subjective: C/o pain right knee cap Gets SOB with walking  Objective: Vital signs in last 24 hours: Temp:  [97.4 F (36.3 C)-98.8 F (37.1 C)] 98.8 F (37.1 C) (01/04 1338) Pulse Rate:  [76-80] 80  (01/04 1338) Cardiac Rhythm:  [-] Normal sinus rhythm (01/03 1900) Resp:  [18-20] 18  (01/04 1338) BP: (113-140)/(45-63) 130/63 mmHg (01/04 1338) SpO2:  [93 %-95 %] 95 % (01/04 1338) Weight:  [77.6 kg (171 lb 1.2 oz)] 171 lb 1.2 oz (77.6 kg) (01/04 0521)  Hemodynamic parameters for last 24 hours:    Intake/Output from previous day: 01/03 0701 - 01/04 0700 In: 645 [P.O.:645] Out: 2000 [Urine:2000] Intake/Output this shift: Total I/O In: 360 [P.O.:360] Out: 400 [Urine:400]  exam unchanged Has point tenderness and a small bruise on lateral aspect of right patella, the joint itself has full mobility and no effusion.  Lab Results:  [facility] 08/14/11 0602  WBC 6.7  HGB 10.6*  HCT 34.0*  PLT 204   BMET:  Basename 08/16/11 0605 08/15/11 0550  NA 134* 138  K 3.7 3.9  CL 100 99  CO2 26 26  GLUCOSE 92 103*  BUN 38* 40*  CREATININE 2.33* 2.33*  CALCIUM 9.1 9.2    PT/INR: No results found for this basename: LABPROT,INR in the last 72 hours ABG    Component Value Date/Time   PHART 7.508* 10/22/2008 1345   HCO3 20.0 10/22/2008 1345   TCO2 28 10/24/2008 1805   ACIDBASEDEF 2.0 10/22/2008 1345   O2SAT 99.0 10/22/2008 1345   CBG (last 3)  No results found for this basename: GLUCAP:3 in the last 72 hours  Assessment/Plan: S/P Procedure(s) (LRB): LEFT HEART CATHETERIZATION WITH CORONARY ANGIOGRAM (N/A) For dental extractions Monday 1/7 Plan surgery later in week, likely will be Friday 1/12   LOS: 14 days    Jaklyn Alen C 08/16/2011

## 2011-08-17 LAB — BASIC METABOLIC PANEL
CO2: 24 mEq/L (ref 19–32)
Calcium: 9.4 mg/dL (ref 8.4–10.5)
Creatinine, Ser: 2.24 mg/dL — ABNORMAL HIGH (ref 0.50–1.35)
GFR calc Af Amer: 34 mL/min — ABNORMAL LOW (ref >90)
GFR calc non Af Amer: 29 mL/min — ABNORMAL LOW (ref >90)
Sodium: 135 mEq/L (ref 135–145)
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
## 2011-08-18 ENCOUNTER — Encounter (HOSPITAL_COMMUNITY): Payer: Self-pay | Admitting: Anesthesiology

## 2011-08-18 LAB — BASIC METABOLIC PANEL
Calcium: 9.2 mg/dL (ref 8.4–10.5)
GFR calc Af Amer: 30 mL/min — ABNORMAL LOW (ref >90)
GFR calc non Af Amer: 26 mL/min — ABNORMAL LOW (ref >90)
Potassium: 3.9 mEq/L (ref 3.5–5.1)
Sodium: 135 mEq/L (ref 135–145)
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
## 2011-08-19 ENCOUNTER — Encounter (HOSPITAL_COMMUNITY): Payer: Self-pay | Admitting: Anesthesiology

## 2011-08-19 ENCOUNTER — Encounter (HOSPITAL_COMMUNITY)
Admission: AD | Disposition: A | Discharge status: Home Health | Payer: Self-pay | Source: Other Acute Inpatient Hospital | Attending: Internal Medicine

## 2011-08-19 ENCOUNTER — Inpatient Hospital Stay (HOSPITAL_COMMUNITY): Payer: Medicare Other | Admitting: Anesthesiology

## 2011-08-19 DIAGNOSIS — K083 Retained dental root: Secondary | ICD-10-CM | POA: Diagnosis present

## 2011-08-19 DIAGNOSIS — M2607 Excessive tuberosity of jaw: Secondary | ICD-10-CM

## 2011-08-19 DIAGNOSIS — M270 Developmental disorders of jaws: Secondary | ICD-10-CM | POA: Diagnosis present

## 2011-08-19 DIAGNOSIS — K045 Chronic apical periodontitis: Secondary | ICD-10-CM

## 2011-08-19 DIAGNOSIS — M278 Other specified diseases of jaws: Secondary | ICD-10-CM | POA: Diagnosis present

## 2011-08-19 DIAGNOSIS — K0401 Reversible pulpitis: Secondary | ICD-10-CM

## 2011-08-19 HISTORY — PX: MULTIPLE EXTRACTIONS WITH ALVEOLOPLASTY: SHX5342

## 2011-08-19 LAB — BASIC METABOLIC PANEL
Chloride: 96 mEq/L (ref 96–112)
GFR calc Af Amer: 24 mL/min — ABNORMAL LOW (ref >90)
GFR calc non Af Amer: 20 mL/min — ABNORMAL LOW (ref >90)
Potassium: 4.3 mEq/L (ref 3.5–5.1)
Sodium: 131 mEq/L — ABNORMAL LOW (ref 135–145)

## 2011-08-19 SURGERY — MULTIPLE EXTRACTION WITH ALVEOLOPLASTY
Anesthesia: General | Wound class: Clean Contaminated

## 2011-08-19 MED ORDER — LACTATED RINGERS IV SOLN
INTRAVENOUS | Status: DC
  Administered 2011-08-19: 08:00:00 via INTRAVENOUS

## 2011-08-19 MED ORDER — MEPERIDINE HCL 25 MG/ML IJ SOLN: 6.2500 mg | INTRAMUSCULAR | Status: DC | PRN

## 2011-08-19 MED ORDER — OXYCODONE-ACETAMINOPHEN 5-325 MG PO TABS
1.0000 | ORAL_TABLET | ORAL | Status: DC | PRN
  Administered 2011-08-19 – 2011-08-20 (×3): 1 via ORAL
  Administered 2011-08-20 (×2): 2 via ORAL
  Administered 2011-08-20 (×2): 1 via ORAL
  Administered 2011-08-20: 2 via ORAL
  Administered 2011-08-21: 1 via ORAL
  Administered 2011-08-24 (×3): 2 via ORAL
  Administered 2011-08-25: 1 via ORAL
  Administered 2011-08-25 (×2): 2 via ORAL
  Filled 2011-08-19: qty 2
  Filled 2011-08-19: qty 1
  Filled 2011-08-19 (×3): qty 2
  Filled 2011-08-19 (×2): qty 1
  Filled 2011-08-19 (×3): qty 2
  Filled 2011-08-19: qty 1
  Filled 2011-08-19 (×2): qty 2
  Filled 2011-08-19: qty 1
  Filled 2011-08-19: qty 2
  Filled 2011-08-19: qty 1

## 2011-08-19 MED ORDER — OXYMETAZOLINE HCL 0.05 % NA SOLN
NASAL | Status: DC | PRN
  Administered 2011-08-19: 1 via NASAL

## 2011-08-19 MED ORDER — MORPHINE SULFATE 4 MG/ML IJ SOLN
2.0000 mg | INTRAMUSCULAR | Status: DC | PRN
Start: 2011-08-19 — End: 2011-08-23
  Administered 2011-08-19 – 2011-08-20 (×3): 2 mg via INTRAVENOUS
  Administered 2011-08-20: 1 mg via INTRAVENOUS
  Administered 2011-08-20: 2 mg via INTRAVENOUS
  Administered 2011-08-20: 4 mg via INTRAVENOUS
  Administered 2011-08-20 – 2011-08-22 (×8): 2 mg via INTRAVENOUS
  Administered 2011-08-22: 4 mg via INTRAVENOUS
  Administered 2011-08-22 (×2): 2 mg via INTRAVENOUS
  Administered 2011-08-22: 4 mg via INTRAVENOUS
  Administered 2011-08-22: 2 mg via INTRAVENOUS
  Filled 2011-08-19 (×19): qty 1

## 2011-08-19 MED ORDER — BUPIVACAINE-EPINEPHRINE 0.5% -1:200000 IJ SOLN
INTRAMUSCULAR | Status: DC | PRN
  Administered 2011-08-19: 7.2 mL

## 2011-08-19 MED ORDER — FENTANYL CITRATE 0.05 MG/ML IJ SOLN
INTRAMUSCULAR | Status: DC | PRN
  Administered 2011-08-19: 100 ug via INTRAVENOUS
  Administered 2011-08-19: 50 ug via INTRAVENOUS

## 2011-08-19 MED ORDER — LIDOCAINE-EPINEPHRINE 2 %-1:100000 IJ SOLN
INTRAMUSCULAR | Status: DC | PRN
  Administered 2011-08-19: 8.5 mL

## 2011-08-19 MED ORDER — ONDANSETRON HCL 4 MG/2ML IJ SOLN
INTRAMUSCULAR | Status: DC | PRN
  Administered 2011-08-19: 4 mg via INTRAVENOUS

## 2011-08-19 MED ORDER — MIDAZOLAM HCL 5 MG/5ML IJ SOLN
INTRAMUSCULAR | Status: DC | PRN
  Administered 2011-08-19: 2 mg via INTRAVENOUS

## 2011-08-19 MED ORDER — LACTATED RINGERS IV SOLN
INTRAVENOUS | Status: DC | PRN
  Administered 2011-08-19: 09:00:00 via INTRAVENOUS

## 2011-08-19 MED ORDER — EPHEDRINE SULFATE 50 MG/ML IJ SOLN
INTRAMUSCULAR | Status: DC | PRN
  Administered 2011-08-19: 10 mg via INTRAVENOUS
  Administered 2011-08-19 (×3): 5 mg via INTRAVENOUS
  Administered 2011-08-19: 10 mg via INTRAVENOUS

## 2011-08-19 MED ORDER — FENTANYL CITRATE 0.05 MG/ML IJ SOLN
25.0000 ug | INTRAMUSCULAR | Status: DC | PRN
  Administered 2011-08-19 (×3): 50 ug via INTRAVENOUS

## 2011-08-19 MED ORDER — SUCCINYLCHOLINE CHLORIDE 20 MG/ML IJ SOLN
INTRAMUSCULAR | Status: DC | PRN
  Administered 2011-08-19: 100 mg via INTRAVENOUS

## 2011-08-19 MED ORDER — PROPOFOL 10 MG/ML IV EMUL
INTRAVENOUS | Status: DC | PRN
  Administered 2011-08-19: 150 mg via INTRAVENOUS

## 2011-08-19 MED ORDER — PROMETHAZINE HCL 25 MG/ML IJ SOLN: 6.2500 mg | INTRAMUSCULAR | Status: DC | PRN

## 2011-08-19 MED ORDER — FENTANYL CITRATE 0.05 MG/ML IJ SOLN
INTRAMUSCULAR | Status: AC
  Filled 2011-08-19: qty 2

## 2011-08-19 SURGICAL SUPPLY — 34 items
ALCOHOL 70% 16 OZ (MISCELLANEOUS) ×2 IMPLANT
ATTRACTOMAT 16X20 MAGNETIC DRP (DRAPES) ×2 IMPLANT
BLADE SURG 15 STRL LF DISP TIS (BLADE) ×2 IMPLANT
BLADE SURG 15 STRL SS (BLADE) ×2
CLOTH BEACON ORANGE TIMEOUT ST (SAFETY) ×2 IMPLANT
COVER SURGICAL LIGHT HANDLE (MISCELLANEOUS) ×2 IMPLANT
CRADLE DONUT ADULT HEAD (MISCELLANEOUS) ×2 IMPLANT
GAUZE PACKING FOLDED 2  STR (GAUZE/BANDAGES/DRESSINGS) ×1
GAUZE PACKING FOLDED 2 STR (GAUZE/BANDAGES/DRESSINGS) ×1 IMPLANT
GAUZE SPONGE 4X4 16PLY XRAY LF (GAUZE/BANDAGES/DRESSINGS) ×4 IMPLANT
GLOVE SURG ORTHO 8.0 STRL STRW (GLOVE) ×2 IMPLANT
GLOVE SURG SS PI 6.5 STRL IVOR (GLOVE) ×2 IMPLANT
GOWN STRL REIN 3XL LVL4 (GOWN DISPOSABLE) ×2 IMPLANT
HEMOSTAT SURGICEL .5X2 ABSORB (HEMOSTASIS) IMPLANT
KIT BASIN OR (CUSTOM PROCEDURE TRAY) ×2 IMPLANT
KIT ROOM TURNOVER OR (KITS) ×2 IMPLANT
MANIFOLD NEPTUNE WASTE (CANNULA) ×2 IMPLANT
NEEDLE BLUNT 16X1.5 OR ONLY (NEEDLE) ×2 IMPLANT
NEEDLE DENTAL 27 LONG (NEEDLE) ×2 IMPLANT
NS IRRIG 1000ML POUR BTL (IV SOLUTION) ×2 IMPLANT
PACK EENT II TURBAN DRAPE (CUSTOM PROCEDURE TRAY) ×2 IMPLANT
PAD ARMBOARD 7.5X6 YLW CONV (MISCELLANEOUS) ×4 IMPLANT
SPONGE SURGIFOAM ABS GEL 100 (HEMOSTASIS) IMPLANT
SPONGE SURGIFOAM ABS GEL 12-7 (HEMOSTASIS) IMPLANT
SPONGE SURGIFOAM ABS GEL SZ50 (HEMOSTASIS) IMPLANT
SUCTION FRAZIER TIP 10 FR DISP (SUCTIONS) ×2 IMPLANT
SUT CHROMIC 3 0 PS 2 (SUTURE) ×8 IMPLANT
SUT CHROMIC 4 0 P 3 18 (SUTURE) IMPLANT
SYR 50ML SLIP (SYRINGE) ×2 IMPLANT
TOWEL OR 17X24 6PK STRL BLUE (TOWEL DISPOSABLE) ×2 IMPLANT
TOWEL OR 17X26 10 PK STRL BLUE (TOWEL DISPOSABLE) ×2 IMPLANT
TUBE CONNECTING 12X1/4 (SUCTIONS) ×2 IMPLANT
WATER STERILE IRR 1000ML POUR (IV SOLUTION) ×2 IMPLANT
YANKAUER SUCT BULB TIP NO VENT (SUCTIONS) ×2 IMPLANT

## 2011-08-19 NOTE — Anesthesia Postprocedure Evaluation (Signed)
  Anesthesia Post-op Note  Patient: Warren [name]  Procedure(s) Performed:  MULTIPLE EXTRACION WITH ALVEOLOPLASTY  Patient Location: PACU  Anesthesia Type: General  Level of Consciousness: awake  Airway and Oxygen Therapy: Patient Spontanous Breathing  Post-op Pain: none  Post-op Assessment: Post-op Vital signs reviewed  Post-op Vital Signs: stable  Complications: No apparent anesthesia complications

## 2011-08-19 NOTE — Progress Notes (Signed)
Nutrition Follow-up and consult   Diet Order:  Clear Liquids, advance as tolerated S/P multiple tooth extractions  Meds: Scheduled Meds:   . amLODipine  10 mg Oral Daily  . ceFAZolin (ANCEF) IV  2 g Intravenous 60 min Pre-Op  . colchicine  0.6 mg Oral BID  . feeding supplement  237 mL Oral BID  . feeding supplement  1 Container Oral BID BM  . fentaNYL      . furosemide  40 mg Oral Daily  . heparin  5,000 Units Subcutaneous Q8H  . hydrALAZINE  50 mg Oral Q8H  . labetalol  200 mg Oral BID  . moxifloxacin  400 mg Oral q1800  . potassium chloride  20 mEq Oral Daily  . sertraline  150 mg Oral Daily  . sodium chloride  3 mL Intravenous Q12H  . DISCONTD: aspirin  81 mg Oral Daily   Continuous Infusions:   . lactated ringers 50 mL/hr at 08/19/11 0827   PRN Meds:.sodium chloride, ALPRAZolam, HYDROcodone-acetaminophen, morphine injection, morphine, oxyCODONE-acetaminophen, traZODone, zolpidem, DISCONTD: acetaminophen, DISCONTD: bupivacaine-EPINEPHrine, DISCONTD: fentaNYL, DISCONTD: lidocaine-EPINEPHrine, DISCONTD: meperidine, DISCONTD: ondansetron (ZOFRAN) IV, DISCONTD: oxymetazoline, DISCONTD: promethazine, DISCONTD: sodium chloride  Labs:  CMP     Component Value Date/Time   NA 131* 08/19/2011 0500   K 4.3 08/19/2011 0500   CL 96 08/19/2011 0500   CO2 24 08/19/2011 0500   GLUCOSE 97 08/19/2011 0500   BUN 43* 08/19/2011 0500   CREATININE 3.00* 08/19/2011 0500   CREATININE 2.19* 06/26/2008 2130   CALCIUM 9.3 08/19/2011 0500   PROT 6.0 08/06/2011 2212   ALBUMIN 3.4* 08/06/2011 2212   AST 15 08/06/2011 2212   ALT 11 08/06/2011 2212   ALKPHOS 115 08/06/2011 2212   BILITOT 1.0 08/06/2011 2212   GFRNONAA 20* 08/19/2011 0500   GFRAA 24* 08/19/2011 0500     Intake/Output Summary (Last 24 hours) at 08/19/11 1517 Last data filed at 08/19/11 1126  Gross per 24 hour  Intake    776 ml  Output    550 ml  Net    226 ml   Patient was unable to speak at time of RD visit related to post extractions.    Weight status:  175 lbs, trending up slightly.  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   Goal:  PO intake to be >75 % unmet. Continue.   Intervention:  Recommend when diet advances to solids, begin with puree or chopped meats as tolerated.  Continue with supplements as previously ordered.   Monitor:  PO intake, weights, labs   Warren [name] Pager #:  [phone]

## 2011-08-19 NOTE — Progress Notes (Signed)
   SUBJECTIVE:  No chest pain.  Breathing OK   PHYSICAL EXAM Filed Vitals:   08/17/11 2218 08/18/11 0600 08/18/11 1525 08/18/11 2200  BP: 144/57 124/39 115/36 130/42  Pulse: 98 71 72 71  Temp: 98.2 F (36.8 C) 97.9 F (36.6 C) 98.2 F (36.8 C) 97.8 F (36.6 C)  TempSrc: Oral Oral Oral   Resp: 16 18 18 18   Height:      Weight:  78.2 kg (172 lb 6.4 oz)    SpO2:  95% 93% 94%   General:  No distress Lungs:  Clear Heart:  RRR, murmurs unchanged Abdomen:  Positive bowel sounds, no rebound no guarding Extremities:  No edema  LABS:  No results found for this or any previous visit (from the past 24 hour(s)).  Intake/Output Summary (Last 24 hours) at 08/19/11 0655 Last data filed at 08/18/11 2332  Gross per 24 hour  Intake    484 ml  Output    950 ml  Net   -466 ml    ASSESSMENT AND PLAN:  1) RENAL INSUFFICIENCY, CHRONIC: Follow creat again in the am  2) AI (aortic insufficiency)/MR: Valve surgery scheduled for the 11th.  Oral surgery today.  3) Acute combined systolic and diastolic heart failure: He seems to be euvolemic.  Continue current meds.    4) HTN: Controlled.  Continue current meds.   Rollene Rotunda 08/19/2011 6:55 AM

## 2011-08-19 NOTE — Op Note (Signed)
Patient:            Warren [name] Date of Birth:  06/19/1946 MRN:                [id number]   DATE OF PROCEDURE:  08/19/2011               OPERATIVE REPORT   PREOPERATIVE DIAGNOSES: 1. Severe aortic insufficiency 2. Preaortic valve replacement dental protocol 3. Chronic periodontitis 4. Apical periodontitis 5. Multiple retained root segments 6. Bilateral mandibular lingual tori 7. Excessive maxillary right tuberosity 8. Multiple lateral exostoses  POSTOPERATIVE DIAGNOSES: 1. Severe aortic insufficiency 2. Preaortic valve replacement dental protocol 3. Chronic periodontitis 4. Apical periodontitis 5. Multiple retained root segments 6. Bilateral mandibular lingual tori 7. Excessive maxillary right tuberosity 8. Multiple lateral exostoses  OPERATIONS: 1. Multiple extraction of tooth numbers 4, 5, 6, 7, 8, 9, 10, 11, 12, 14, 15, 17, 18, 20, 21, 22, 23, 24, 25, 26, 27, 28, 29, and 30. 2. 4 Quadrants of alveoloplasty 3. Bilateral mandibular lingual tori reductions 4. Mandibular left and mandibular right lateral exostoses reductions on the facial and lingual aspects.  SURGEON: Charlynne Pander, DDS  ASSISTANT: Zettie Pho, (dental assistant)  ANESTHESIA: General anesthesia via nasoendotracheal tube.  MEDICATIONS: 1. Ancef 2 g IV prior to invasive dental procedures. 2. Local anesthesia with a total utilization of 5 carpules each containing 34 mg of lidocaine with 0.017 mg of epinephrine as well as 4 carpules each containing 9 mg of bupivacaine with 0.009 mg of epinephrine.  SPECIMENS: There are 24 teeth that were discarded.  DRAINS: None  CULTURES: None  COMPLICATIONS: None   ESTIMATED BLOOD LOSS: 150 mLs.  INTRAVENOUS FLUIDS: 600 mLs of Lactated ringers solution.  INDICATIONS: The patient was recently diagnosed with severe aortic insufficiency and mitral regurgitation.  A dental consultation was then requested to to rule out dental infection prior to  anticipated heart valve surgery.  The patient was examined and treatment planned for extraction of remaining teeth with alveoloplasty and pre-prosthetic surgery as indicated.  This treatment plan was formulated to decrease the risks and complications associated with dental infection from affecting the patient's systemic health and anticipated heart valve surgery.  OPERATIVE FINDINGS: Patient was examined operating room number 3.  The teeth were identified for extraction. The patient was noted be affected by chronic periodontitis, apical periodontitis, multiple retained root segments, multiple dental caries, bilateral mandibular lingual tori, maxillary right excessive tuberosity, and multiple facial and lingual lateral exostoses.   DESCRIPTION OF PROCEDURE: Patient was brought to the main operating room number 3. Patient was then placed in the supine position on the operating table. General Anesthesia was then induced per the anesthesia team. The patient was then prepped and draped in the usual manner for dental medicine procedure. A timeout was performed. The patient was identified and procedures were verified. A throat pack was placed at this time. The oral cavity was then thoroughly examined with the findings noted above. The patient was then ready for dental medicine procedure as follows:  Local anesthesia was then administered sequentially with a total utilization of 5 carpules each containing 34 mg of lidocaine with 0.017 mg of epinephrine as well as 4 carpules  each containing 9 mg bupivacaine with 0.009 mg of epinephrine.  The Maxillary left and right quadrants first approached. Anesthesia was then delivered utilizing infiltration with lidocaine with epinephrine. A #15 blade incision was then made from the maxillary right tuberosity and extended to the maxillary  left tuberosity.  A  surgical flap was then carefully reflected. Appropriate amounts of buccal and interseptal bone were then removed  utilizing a surgical handpiece and bur and copious amounts of sterile saline.  The teeth were then subluxated with a series of straight elevators. Tooth numbers 4, 5, 6, 7, 8, 9, 10, 11, 12 and retained roots of #15 were then removed with a 150 forceps. Tooth #14 then had the coronal aspect removed with a 53R forceps leaving roots remaining. Further bone was then removed appropriately and retained roots were then elevated out with a series of elevators. Alveoloplasty was then performed utilizing a ronguers and bone file. At this point time the soft tissue maxillary right tuberosity reduction was achieved utilizing a series of 15 blade incisions with removal of redundant tissue. The surgical sites of the maxillary left and maxillary right quadrants were then irrigated with copious of sterile saline. Tissues were then further trimmed as needed. The maxillary right surgical site was then closed from the maxillary right tuberosity and extended to the mesial #8 utilizing 3-0 chromic gut suture in a continuous interrupted suture technique x1. The maxillary left surgical site was then closed from the distal of the tuberosity to the mesial of #9 with 3-O chromic gut suture and a continuous interrupted suture technique X 1.  At this point time, the mandibular quadrants were approached. The patient was given bilateral inferior alveolar nerve blocks and long buccal nerve blocks utilizing the bupivacaine with epinephrine. Further infiltration was then achieved utilizing the lidocaine with epinephrine. A 15 blade incision was then made from the distal of number 17 and extended to the distal of #32 .  A surgical flap was then carefully reflected. Appropriate amounts of buccal and interseptal bone were then removed appropriately with a surgical handpiece and bur and copious amounts of sterile saline. The teeth were then subluxated with a series of straight elevators. The coronal aspect tooth numbers 17, 18 were then removed with  a 23 forceps. Further bone was then removed and eventually the retained roots were elevated out with a series of elevators  without complications. Tooth numbers 20, 21, 22, 23, 24, 25, 26, and 27 were then removed with a 151 forceps without complications. The coronal aspect of tooth numbers 28, 29 and 30 were then removed with a 151 forceps leaving roots remaining. Further bone was then removed around retained roots with a surgical handpiece and bur and copious amounts sterile saline. The roots were then elevated out with a sereis of Cryers elevators without complications. Alveoloplasty was then performed utilizing a rongeurs and bone file. At this point time the flaps were then further reflected to expose the facial lateral exostoses and bilateral mandibular lingual tori and lateral exostoses. These were then reduced utilizing a surgical handpiece and bur and copious amounts sterile saline. Alveoloplasty was then done with a rongeur and bone file. The surgical sites were then irrigated with copious amounts of sterile saline. The tissues were approximated and trimmed appropriately. The mandibular right surgical site was then closed from the distal of 32 and extended the mesial #25 utilizing 3-0 chromic gut suture in a continuous interrupted suture technique x1. Two individual interrupted sutures were then placed to further close the surgical site. The mandibular left surgical site was then closed from the distal of #17 and extended to the mesial #24 utilizing 3-0 chromic gut suture in a continuous interrupted suture technique x1. 2 individual interrupted sutures were then placed to further close the  surgical site.  At this point time, the entire mouth was irrigated with copious amounts of sterile saline. The patient was exam for complications, seeing none, the dental medicine procedure was deemed to be complete. The throat pack was removed at this time. A series of 4 x 4 gauze were placed in the mouth to aid  hemostasis. An oral airway was then placed at the request of the anesthesia team. The patient was then handed over to the anesthesia team for final disposition. After an appropriate amount of time, the patient was extubated and taken to the postanesthsia care unit with stable vital signs and a good condition. All counts were correct for the dental medicine procedure.   Charlynne Pander, DDS.

## 2011-08-19 NOTE — Progress Notes (Signed)
UR Completed.  Warren [name] 08/19/2011 [phone]  

## 2011-08-19 NOTE — Anesthesia Preprocedure Evaluation (Addendum)
Anesthesia Evaluation  Patient identified by MRN, date of birth, ID band Patient awake    Reviewed: Allergy & Precautions, H&P , NPO status , Patient's Chart, lab work & pertinent test results  History of Anesthesia Complications Negative for: history of anesthetic complications  Airway Mallampati: II  Neck ROM: Full    Dental  (+) Teeth Intact, Missing, Loose, Poor Dentition and Dental Advisory Given   Pulmonary shortness of breath and with exertion,  clear to auscultation        Cardiovascular Exercise Tolerance: Poor hypertension, Pt. on medications + angina +CHF + dysrhythmias Atrial Fibrillation + Valvular Problems/Murmurs MR Irregular Normal    Neuro/Psych Depression PTSDPt denies a stroke    GI/Hepatic negative GI ROS, Neg liver ROS,   Endo/Other  Negative Endocrine ROS  Renal/GU Renal diseaseChronic Renal insuff     Musculoskeletal  (+) Arthritis -,   Abdominal   Peds  Hematology negative hematology ROS (+)   Anesthesia Other Findings   Reproductive/Obstetrics                          Anesthesia Physical Anesthesia Plan  ASA: III  Anesthesia Plan: General   Post-op Pain Management:    Induction: Intravenous  Airway Management Planned: Nasal ETT and Oral ETT  Additional Equipment:   Intra-op Plan:   Post-operative Plan: Extubation in OR  Informed Consent: I have reviewed the patients History and Physical, chart, labs and discussed the procedure including the risks, benefits and alternatives for the proposed anesthesia with the patient or authorized representative who has indicated his/her understanding and acceptance.   Dental advisory given  Plan Discussed with: CRNA, Surgeon and Anesthesiologist  Anesthesia Plan Comments:        Anesthesia Quick Evaluation

## 2011-08-19 NOTE — Transfer of Care (Signed)
Immediate Anesthesia Transfer of Care Note  Patient: Warren [name]  Procedure(s) Performed:  MULTIPLE EXTRACION WITH ALVEOLOPLASTY  Patient Location: PACU  Anesthesia Type: General  Level of Consciousness: awake and alert   Airway & Oxygen Therapy: Patient Spontanous Breathing and Patient connected to face mask oxygen  Post-op Assessment: Report given to PACU RN and Post -op Vital signs reviewed and stable  Post vital signs: Reviewed and stable  Complications: No apparent anesthesia complications

## 2011-08-20 ENCOUNTER — Encounter (HOSPITAL_COMMUNITY): Payer: Self-pay | Admitting: Dentistry

## 2011-08-20 ENCOUNTER — Inpatient Hospital Stay (HOSPITAL_COMMUNITY): Payer: Medicare Other

## 2011-08-20 LAB — BASIC METABOLIC PANEL
BUN: 47 mg/dL — ABNORMAL HIGH (ref 6–23)
Calcium: 9.4 mg/dL (ref 8.4–10.5)
Creatinine, Ser: 3.32 mg/dL — ABNORMAL HIGH (ref 0.50–1.35)
GFR calc Af Amer: 21 mL/min — ABNORMAL LOW (ref >90)
GFR calc non Af Amer: 18 mL/min — ABNORMAL LOW (ref >90)
Glucose, Bld: 90 mg/dL (ref 70–99)
Potassium: 4.5 mEq/L (ref 3.5–5.1)

## 2011-08-20 LAB — CBC
HCT: 32.6 % — ABNORMAL LOW (ref 39.0–52.0)
Hemoglobin: 10.1 g/dL — ABNORMAL LOW (ref 13.0–17.0)
MCH: 24.7 pg — ABNORMAL LOW (ref 26.0–34.0)
MCHC: 31 g/dL (ref 30.0–36.0)
MCV: 79.7 fL (ref 78.0–100.0)
RDW: 16.1 % — ABNORMAL HIGH (ref 11.5–15.5)

## 2011-08-20 MED ORDER — CHLORHEXIDINE GLUCONATE 0.12 % MT SOLN
15.0000 mL | Freq: Two times a day (BID) | OROMUCOSAL | Status: DC
  Administered 2011-08-20 – 2011-08-27 (×16): 15 mL via OROMUCOSAL
  Filled 2011-08-20 (×18): qty 15

## 2011-08-20 MED ORDER — COLCHICINE 0.6 MG PO TABS
0.3000 mg | ORAL_TABLET | Freq: Every day | ORAL | Status: DC
  Administered 2011-08-20: 0.3 mg via ORAL
  Filled 2011-08-20 (×2): qty 0.5

## 2011-08-20 MED ORDER — ONDANSETRON HCL 4 MG/2ML IJ SOLN
4.0000 mg | Freq: Three times a day (TID) | INTRAMUSCULAR | Status: DC | PRN
  Administered 2011-08-20 – 2011-08-24 (×6): 4 mg via INTRAVENOUS
  Filled 2011-08-20 (×6): qty 2

## 2011-08-20 MED ORDER — ONDANSETRON HCL 4 MG/2ML IJ SOLN
INTRAMUSCULAR | Status: AC
  Administered 2011-08-20: 4 mg via INTRAVENOUS
  Filled 2011-08-20: qty 2

## 2011-08-20 MED ORDER — BACITRACIN-NEOMYCIN-POLYMYXIN OINTMENT TUBE
TOPICAL_OINTMENT | Freq: Four times a day (QID) | CUTANEOUS | Status: DC
  Administered 2011-08-20: 22:00:00 via TOPICAL
  Administered 2011-08-20: 1 via TOPICAL
  Administered 2011-08-21 – 2011-08-28 (×22): via TOPICAL
  Filled 2011-08-20 (×2): qty 15

## 2011-08-20 NOTE — Consult Note (Signed)
POST OPERATIVE NOTE:  Date:  08/20/2011 Patient: Warren [name]  VITALS: BP 124/42  Pulse 68  Temp(Src) 97.4 F (36.3 C) (Axillary)  Resp 16  Ht 6\' 3"  (1.905 m)  Wt 176 lb 2.4 oz (79.9 kg)  BMI 22.02 kg/m2  SpO2 96%   CBC    Component Value Date/Time   WBC 6.7 08/14/2011 0602   RBC 4.34 08/14/2011 0602   HGB 10.6* 08/14/2011 0602   HCT 34.0* 08/14/2011 0602   PLT 204 08/14/2011 0602   MCV 78.3 08/14/2011 0602   MCH 24.4* 08/14/2011 0602   MCHC 31.2 08/14/2011 0602   RDW 16.0* 08/14/2011 0602   LYMPHSABS 0.6* 08/10/2011 1430   MONOABS 0.6 08/10/2011 1430   EOSABS 0.2 08/10/2011 1430   BASOSABS 0.0 08/10/2011 1430   BMET    Component Value Date/Time   NA 131* 08/19/2011 0500   K 4.3 08/19/2011 0500   CL 96 08/19/2011 0500   CO2 24 08/19/2011 0500   GLUCOSE 97 08/19/2011 0500   BUN 43* 08/19/2011 0500   CREATININE 3.00* 08/19/2011 0500   CREATININE 2.19* 06/26/2008 2130   CALCIUM 9.3 08/19/2011 0500   GFRNONAA 20* 08/19/2011 0500   GFRAA 24* 08/19/2011 0500    Patient is status post extraction of remaining teeth with alveoloplasty and significant pre-prosthetic surgery in the operating room on 08/18/2011.  SUBJECTIVE: Patient is lying in his bed. Patient did have some significant discomfort which was recently relieved with IV pain medication. Patient currently denies any active bleeding or significant heme or ooze. Patient indicates that he nutritionist does not evaluated him for his edentulous state at this time.2  EXAM: There is no sign of active bleeding at this time. Sutures are intact. The mouth does have evidence of heme from previous bleeding. His lips are dry. His mouth is dry. Patient has significant extra oral ecchymosis. Patient has evidence of intraoral swelling as well.  ASSESSMENT: 1. Postop course is consistent with significant extraction procedures with alveoloplasty and pre-prosthetic surgery. 2. Dry, chapped lips.  PLAN: 1. Continue chlorhexidine rinses twice daily as  prescribed. 2. Use salt water rinses every 2 hours while awake in between the chlorhexidine rinses. 3. Apply Triple Antibiotic ointment to his lips and corner of her mouth 4 times daily as directed. 4. Check nutritional consultation for his edentulous condition. 5. Surgery with Dr. Dorris Fetch is planned for this Friday. 6. Suture removal in 7-10 days or sutures can dissolve on her own.   Charlynne Pander, DDS

## 2011-08-20 NOTE — Progress Notes (Addendum)
Patient Name: Warren [name] Date of Encounter: 08/20/2011, 6:50 AM    Subjective  Face/mouth hurts following surgery. Still SOB with minimal exertion but resting comfortably in bed. No CP.   Objective   Telemetry: NSR occasional PVCs Physical Exam: Filed Vitals:   08/20/11 0550  BP: 124/42  Pulse: 68  Temp: 97.4 F (36.3 C)  Resp: 16   General: Well developed dissheveled appearing WM, in no acute distress. Head: Normocephalic, sclera non-icteric, no xanthomas, nares are without discharge, mandibular ecchymoses/swelling noted.  Neck: No obvious lymphadenopathy. JVD not elevated. Lungs: Decreased BS at bases with faint mid-lung rales R>L. No wheezes or rhonchi. Breathing is unlabored. Heart: RRR, 3/6 sem at apex, 3/6 soft diastolic murmur along RUSB. S1 S2 without rubs or gallops. Well-healed midline sternal scar. Abdomen: Soft, non-tender, non-distended with normoactive bowel sounds. No hepatomegaly. No rebound/guarding. No obvious abdominal masses. Msk:  Strength and tone appear normal for age. Extremities: No clubbing or cyanosis. No edema.  Distal pedal pulses are 2+ and equal bilaterally. Neuro: Alert and oriented X 3. Moves all extremities spontaneously. Psych:  Responds to questions appropriately with a normal affect.    Intake/Output Summary (Last 24 hours) at 08/20/11 0650 Last data filed at 08/20/11 1610  Gross per 24 hour  Intake 1907.5 ml  Output    650 ml  Net 1257.5 ml    Labs:  Basename 08/19/11 0500 08/18/11 0607  NA 131* 135  K 4.3 3.9  CL 96 100  CO2 24 26  GLUCOSE 97 102*  BUN 43* 39*  CREATININE 3.00* 2.45*  CALCIUM 9.3 9.2  MG -- --  PHOS -- --    Radiology/Studies:  1.Chest Port 1 View 08/10/2011  *RADIOLOGY REPORT*  Clinical Data: Shortness of breath  PORTABLE CHEST - 1 VIEW  Comparison: 07/04/2010and 02/11/2009  Findings: There are changes of median sternotomy. The cardiac silhouette is increased in size compared to the prior portable  chest radiograph of July 2010.  Findings may reflect interval enlargement of the heart, or development of pericardial effusion. There is a moderate sized right pleural effusion.  Right perihilar and right basilar airspace disease is present.  There are patchy opacities at the left lung base.  Mild pulmonary vascular congestion.  IMPRESSION:  1.  Suspect congestive heart failure pattern with moderate right- sided pleural effusion.  Airspace disease is asymmetric, right greater than left.  Right-sided pneumonia cannot be excluded by imaging.  Suggest clinical correlation. 2.  Cardiac silhouette is larger on today's study compared to prior chest or chest radiograph July 2010, suggesting interval development of cardiomegaly versus pericardial effusion.  Original Report Authenticated By: [name], M.D.     Assessment and Plan   1. Severe aortic insufficiency/mitral regurgitation with prolapse (echo at [facility]) - awaiting valve surgery tentatively for 1/11 per notes - s/p peridontal surgery yesterday secondary to poor dentition  2. Acute on chronic renal failure - Cr up to 3.32 this morning. See #3. Follow Cr tomorrow. Watch K as well given that he's on replacement. Will decrease colchicine to renal dosing.  3 Acute combined diastolic and systolic CHF - weights steadily creeping up over the last week (171lb on 1/4, 176 today). Not markedly volume overloaded on exam but some faint rales. Will get CXR this AM to determine if he needs further diuresis. Will D/C Avelox as he has completed 9 days of therapy and SOB was felt more likely secondary to CHF.  Signed, [name] PA-C  History reviewed with the patient, no changes to be made.  The patient exam reveals bruising and swelling lower jaw.  Lungs clear.  AS/AI murmur unchanged.  All available labs, radiology testing, previous records reviewed. Agree with documented assessment and plan.  I will hold his Lasix given the rise in creat and increased po  intake.   [name] [facility]  9:34 AM 06/28/2011

## 2011-08-20 NOTE — Progress Notes (Signed)
1 Day Post-Op Procedure(s) (LRB): MULTIPLE EXTRACION WITH ALVEOLOPLASTY (N/A) Subjective: C/o pain from extractions SOB with exertion  Objective: Vital signs in last 24 hours: Temp:  [96.9 F (36.1 C)-97.8 F (36.6 C)] 97.8 F (36.6 C) (01/08 1800) Pulse Rate:  [68-89] 78  (01/08 1800) Cardiac Rhythm:  [-] Normal sinus rhythm (01/08 0830) Resp:  [16-19] 18  (01/08 1800) BP: (124-150)/(42-58) 136/49 mmHg (01/08 1800) SpO2:  [90 %-96 %] 93 % (01/08 1800) FiO2 (%):  [4 %] 4 % (01/08 1800) Weight:  [79.9 kg (176 lb 2.4 oz)] 176 lb 2.4 oz (79.9 kg) (01/08 0550)  Hemodynamic parameters for last 24 hours:    Intake/Output from previous day: 01/07 0701 - 01/08 0700 In: 1877.5 [P.O.:300; I.V.:1577.5] Out: 650 [Urine:500; Blood:150] Intake/Output this shift: Total I/O In: 433 [P.O.:30; I.V.:403] Out: -   General appearance: alert and no distress bruising around mouth, chin  Lab Results:  [facility] 08/20/11 0655  WBC 4.8  HGB 10.1*  HCT 32.6*  PLT 132*   BMET:  Basename 08/20/11 0655 08/19/11 0500  NA 135 131*  K 4.5 4.3  CL 100 96  CO2 26 24  GLUCOSE 90 97  BUN 47* 43*  CREATININE 3.32* 3.00*  CALCIUM 9.4 9.3    PT/INR: No results found for this basename: LABPROT,INR in the last 72 hours ABG    Component Value Date/Time   PHART 7.508* 10/22/2008 1345   HCO3 20.0 10/22/2008 1345   TCO2 28 10/24/2008 1805   ACIDBASEDEF 2.0 10/22/2008 1345   O2SAT 99.0 10/22/2008 1345   CBG (last 3)  No results found for this basename: GLUCAP:3 in the last 72 hours  Assessment/Plan: S/P Procedure(s) (LRB): MULTIPLE EXTRACION WITH ALVEOLOPLASTY (N/A) AI, MR, history of repair aortic dissection Has had teeth extracted Biggest concern now is that creatinine is up significantly over the past week, from 2.0 to 3.3 today. Weight is up a couple of kilos over same period of time. It does not appear he's been seen by renal during this admission, it may be worth having them take a look  prior to surgery.   LOS: 18 days    Warren [name] C 08/20/2011

## 2011-08-21 ENCOUNTER — Inpatient Hospital Stay (HOSPITAL_COMMUNITY): Payer: Medicare Other

## 2011-08-21 LAB — BASIC METABOLIC PANEL
BUN: 60 mg/dL — ABNORMAL HIGH (ref 6–23)
Calcium: 9.4 mg/dL (ref 8.4–10.5)
Chloride: 99 mEq/L (ref 96–112)
Creatinine, Ser: 4 mg/dL — ABNORMAL HIGH (ref 0.50–1.35)
GFR calc Af Amer: 17 mL/min — ABNORMAL LOW (ref >90)

## 2011-08-21 LAB — CBC
HCT: 34.8 % — ABNORMAL LOW (ref 39.0–52.0)
Hemoglobin: 10.2 g/dL — ABNORMAL LOW (ref 13.0–17.0)
MCH: 24.9 pg — ABNORMAL LOW (ref 26.0–34.0)
MCHC: 30.7 g/dL (ref 30.0–36.0)
MCHC: 31.3 g/dL (ref 30.0–36.0)
MCV: 79.8 fL (ref 78.0–100.0)
Platelets: 142 10*3/uL — ABNORMAL LOW (ref 150–400)
RDW: 16.2 % — ABNORMAL HIGH (ref 11.5–15.5)
WBC: 4.6 10*3/uL (ref 4.0–10.5)

## 2011-08-21 LAB — DIFFERENTIAL
Basophils Relative: 0 % (ref 0–1)
Eosinophils Absolute: 0 10*3/uL (ref 0.0–0.7)
Monocytes Relative: 9 % (ref 3–12)
Neutrophils Relative %: 84 % — ABNORMAL HIGH (ref 43–77)

## 2011-08-21 MED ORDER — SODIUM CHLORIDE 0.9 % IV SOLN
INTRAVENOUS | Status: AC
  Administered 2011-08-21 (×2): via INTRAVENOUS

## 2011-08-21 NOTE — Progress Notes (Signed)
Patient discussed at the Long Length of Stay Warren [name] Weeks 08/21/2011  

## 2011-08-21 NOTE — Progress Notes (Signed)
Pt. Nauseous and vomiting with oral intake at this point throughout the night MD  Was notified at 22:15 on 08/20/2011 order for prn Zofran given
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
## 2011-08-21 NOTE — Progress Notes (Signed)
2 Days Post-Op Procedure(s) (LRB): MULTIPLE EXTRACION WITH ALVEOLOPLASTY (N/A) Subjective: Says he hasn't been able to take liquids due to pain after extractions   Objective: Vital signs in last 24 hours: Temp:  [97.4 F (36.3 C)-97.8 F (36.6 C)] 97.4 F (36.3 C) (01/09 0540) Pulse Rate:  [78-80] 79  (01/09 1118) Cardiac Rhythm:  [-] Normal sinus rhythm (01/09 0807) Resp:  [18] 18  (01/09 0540) BP: (130-136)/(41-49) 130/42 mmHg (01/09 1118) SpO2:  [93 %-96 %] 93 % (01/09 0540) FiO2 (%):  [4 %] 4 % (01/09 0540) Weight:  [79.5 kg (175 lb 4.3 oz)] 79.5 kg (175 lb 4.3 oz) (01/09 0540)  Hemodynamic parameters for last 24 hours:    Intake/Output from previous day: 01/08 0701 - 01/09 0700 In: 493 [P.O.:90; I.V.:403] Out: 200 [Urine:200] Intake/Output this shift: Total I/O In: 213 [P.O.:210; I.V.:3] Out: 100 [Urine:100]  General appearance: alert and no distress Lungs: diminished breath sounds base - right Extremities: no edema Ecchymoses mouth/ jaw unchanged Lab Results:  Basename 08/21/11 1317 08/21/11 0637  WBC 4.8 4.6  HGB 10.2* 10.7*  HCT 32.6* 34.8*  PLT 154 142*   BMET:  Basename 08/21/11 0637 08/20/11 0655  NA 135 135  K 5.1 4.5  CL 99 100  CO2 22 26  GLUCOSE 102* 90  BUN 60* 47*  CREATININE 4.00* 3.32*  CALCIUM 9.4 9.4    PT/INR: No results found for this basename: LABPROT,INR in the last 72 hours ABG    Component Value Date/Time   PHART 7.508* 10/22/2008 1345   HCO3 20.0 10/22/2008 1345   TCO2 28 10/24/2008 1805   ACIDBASEDEF 2.0 10/22/2008 1345   O2SAT 99.0 10/22/2008 1345   CBG (last 3)  No results found for this basename: GLUCAP:3 in the last 72 hours  Assessment/Plan: S/P Procedure(s) (LRB): MULTIPLE EXTRACION WITH ALVEOLOPLASTY (N/A) Creatinine up dramaticallly over past 48 hours- renal seeing in consultation Hope to proceed with surgery this week, but will depend on renal function   LOS: 19 days    Shardea Cwynar C 08/21/2011

## 2011-08-21 NOTE — Progress Notes (Signed)
SUBJECTIVE:  Nauseated.  His mouth still is aching   PHYSICAL EXAM Filed Vitals:   08/20/11 1400 08/20/11 1800 08/20/11 2048 08/21/11 0540  BP: 131/58 136/49 133/45 130/41  Pulse: 87 78 78 80  Temp: 97.2 F (36.2 C) 97.8 F (36.6 C) 97.5 F (36.4 C) 97.4 F (36.3 C)  TempSrc: Axillary Axillary    Resp: 19 18 18 18   Height:      Weight:    79.5 kg (175 lb 4.3 oz)  SpO2: 90% 93% 96% 93%   General:  No distress Lungs:  Clear Heart:  Unchanged AI Abdomen:  Non tender.  Positive bowel sounds Extremities:  No edema.  LABS: Lab Results  Component Value Date   CKTOTAL 206 11/01/2008   CKMB 2.5 11/01/2008   TROPONINI  Value: 0.32        PERSISTENTLY INCREASED TROPONIN VALUES IN THE RANGE OF 0.06-0.49 ng/mL CAN BE SEEN IN:       -UNSTABLE ANGINA       -CONGESTIVE HEART FAILURE       -MYOCARDITIS       -CHEST TRAUMA       -ARRYHTHMIAS       -LATE PRESENTING MI       -COPD   CLINICAL FOLLOW-UP RECOMMENDED.* 11/01/2008   Results for orders placed during the hospital encounter of 08/02/11 (from the past 24 hour(s))  BASIC METABOLIC PANEL     Status: Abnormal   Collection Time   08/21/11  6:37 AM      Component Value Range   Sodium 135  135 - 145 (mEq/L)   Potassium 5.1  3.5 - 5.1 (mEq/L)   Chloride 99  96 - 112 (mEq/L)   CO2 22  19 - 32 (mEq/L)   Glucose, Bld 102 (*) 70 - 99 (mg/dL)   BUN 60 (*) 6 - 23 (mg/dL)   Creatinine, Ser 8.29 (*) 0.50 - 1.35 (mg/dL)   Calcium 9.4  8.4 - 56.2 (mg/dL)   GFR calc non Af Amer 14 (*) >90 (mL/min)   GFR calc Af Amer 17 (*) >90 (mL/min)  CBC     Status: Abnormal   Collection Time   08/21/11  6:37 AM      Component Value Range   WBC 4.6  4.0 - 10.5 (K/uL)   RBC 4.36  4.22 - 5.81 (MIL/uL)   Hemoglobin 10.7 (*) 13.0 - 17.0 (g/dL)   HCT 13.0 (*) 86.5 - 52.0 (%)   MCV 79.8  78.0 - 100.0 (fL)   MCH 24.5 (*) 26.0 - 34.0 (pg)   MCHC 30.7  30.0 - 36.0 (g/dL)   RDW 78.4 (*) 69.6 - 15.5 (%)   Platelets 142 (*) 150 - 400 (K/uL)    Intake/Output  Summary (Last 24 hours) at 08/21/11 0841 Last data filed at 08/21/11 0545  Gross per 24 hour  Intake    493 ml  Output    200 ml  Net    293 ml    ASSESSMENT AND PLAN: 1. 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 Acute on chronic renal insufficiency:  Creat still rising.  Diuretics held.  I/O even.  Weight stable today.  I will get renal involved.  D/C KCL   3 Acute combined diastolic and systolic CHF - He is not volume overloaded at this point and in fact probably the opposite since his PO intake  is down. I have encouraged increased PO fluid intake.  He has nausea but no acute abdominal process that I can identify.     [name] [facility] 08/21/2011 8:41 AM

## 2011-08-21 NOTE — Progress Notes (Signed)
UR Completed.  Katherina Wimer Norris 08/21/2011 [phone]  

## 2011-08-22 LAB — RENAL FUNCTION PANEL
BUN: 67 mg/dL — ABNORMAL HIGH (ref 6–23)
Calcium: 8.9 mg/dL (ref 8.4–10.5)
Creatinine, Ser: 4.18 mg/dL — ABNORMAL HIGH (ref 0.50–1.35)
GFR calc Af Amer: 16 mL/min — ABNORMAL LOW (ref >90)
Glucose, Bld: 101 mg/dL — ABNORMAL HIGH (ref 70–99)
Phosphorus: 4.9 mg/dL — ABNORMAL HIGH (ref 2.3–4.6)
Sodium: 136 mEq/L (ref 135–145)

## 2011-08-22 LAB — CBC
HCT: 31.7 % — ABNORMAL LOW (ref 39.0–52.0)
MCH: 24.7 pg — ABNORMAL LOW (ref 26.0–34.0)
MCH: 24.8 pg — ABNORMAL LOW (ref 26.0–34.0)
MCHC: 31.2 g/dL (ref 30.0–36.0)
MCHC: 31.4 g/dL (ref 30.0–36.0)
MCV: 79.1 fL (ref 78.0–100.0)
Platelets: 177 10*3/uL (ref 150–400)
RDW: 16.5 % — ABNORMAL HIGH (ref 11.5–15.5)
RDW: 16.5 % — ABNORMAL HIGH (ref 11.5–15.5)

## 2011-08-22 MED ORDER — FENTANYL CITRATE 0.05 MG/ML IJ SOLN
50.0000 ug | INTRAMUSCULAR | Status: DC | PRN
  Administered 2011-08-26: 50 ug via INTRAVENOUS
  Filled 2011-08-22: qty 2

## 2011-08-22 MED ORDER — LACTATED RINGERS IV SOLN: INTRAVENOUS | Status: DC

## 2011-08-22 MED ORDER — SODIUM CHLORIDE 0.9 % IV SOLN
INTRAVENOUS | Status: AC
  Administered 2011-08-22: 10:00:00 via INTRAVENOUS

## 2011-08-22 MED ORDER — MIDAZOLAM HCL 2 MG/2ML IJ SOLN: 1.0000 mg | INTRAMUSCULAR | Status: DC | PRN

## 2011-08-22 NOTE — Progress Notes (Signed)
SUBJECTIVE:  Less nauseated.  His mouth still is aching.  SOB with minimal activity but not at rest.   PHYSICAL EXAM Filed Vitals:   08/21/11 1118 08/21/11 1434 08/21/11 2146 08/22/11 0517  BP: 130/42 120/41 138/44 121/46  Pulse: 79 76  87  Temp:  97.4 F (36.3 C)  97.7 F (36.5 C)  TempSrc:  Axillary  Axillary  Resp:  20  22  Height:      Weight:    79.52 kg (175 lb 5 oz)  SpO2:  92%  93%   General:  No distress Lungs:  Clear Heart:  Unchanged AI Abdomen:  Non tender.  Positive bowel sounds Extremities:  No edema.  LABS:  Results for orders placed during the hospital encounter of 08/02/11 (from the past 24 hour(s))  CBC     Status: Abnormal   Collection Time   08/21/11  1:17 PM      Component Value Range   WBC 4.8  4.0 - 10.5 (K/uL)   RBC 4.10 (*) 4.22 - 5.81 (MIL/uL)   Hemoglobin 10.2 (*) 13.0 - 17.0 (g/dL)   HCT 40.9 (*) 81.1 - 52.0 (%)   MCV 79.5  78.0 - 100.0 (fL)   MCH 24.9 (*) 26.0 - 34.0 (pg)   MCHC 31.3  30.0 - 36.0 (g/dL)   RDW 91.4 (*) 78.2 - 15.5 (%)   Platelets 154  150 - 400 (K/uL)  DIFFERENTIAL     Status: Abnormal   Collection Time   08/21/11  1:17 PM      Component Value Range   Neutrophils Relative 84 (*) 43 - 77 (%)   Neutro Abs 4.0  1.7 - 7.7 (K/uL)   Lymphocytes Relative 7 (*) 12 - 46 (%)   Lymphs Abs 0.4 (*) 0.7 - 4.0 (K/uL)   Monocytes Relative 9  3 - 12 (%)   Monocytes Absolute 0.4  0.1 - 1.0 (K/uL)   Eosinophils Relative 1  0 - 5 (%)   Eosinophils Absolute 0.0  0.0 - 0.7 (K/uL)   Basophils Relative 0  0 - 1 (%)   Basophils Absolute 0.0  0.0 - 0.1 (K/uL)  RENAL FUNCTION PANEL     Status: Abnormal   Collection Time   08/22/11  6:40 AM      Component Value Range   Sodium 136  135 - 145 (mEq/L)   Potassium 4.5  3.5 - 5.1 (mEq/L)   Chloride 101  96 - 112 (mEq/L)   CO2 21  19 - 32 (mEq/L)   Glucose, Bld 101 (*) 70 - 99 (mg/dL)   BUN 67 (*) 6 - 23 (mg/dL)   Creatinine, Ser 9.56 (*) 0.50 - 1.35 (mg/dL)   Calcium 8.9  8.4 - 21.3 (mg/dL)    Phosphorus 4.9 (*) 2.3 - 4.6 (mg/dL)   Albumin 3.2 (*) 3.5 - 5.2 (g/dL)   GFR calc non Af Amer 14 (*) >90 (mL/min)   GFR calc Af Amer 16 (*) >90 (mL/min)  CBC     Status: Abnormal   Collection Time   08/22/11  6:40 AM      Component Value Range   WBC 6.0  4.0 - 10.5 (K/uL)   RBC 4.01 (*) 4.22 - 5.81 (MIL/uL)   Hemoglobin 9.9 (*) 13.0 - 17.0 (g/dL)   HCT 08.6 (*) 57.8 - 52.0 (%)   MCV 79.1  78.0 - 100.0 (fL)   MCH 24.7 (*) 26.0 - 34.0 (pg)   MCHC 31.2  30.0 -  36.0 (g/dL)   RDW 16.1 (*) 09.6 - 15.5 (%)   Platelets 193  150 - 400 (K/uL)    Intake/Output Summary (Last 24 hours) at 08/22/11 0934 Last data filed at 08/22/11 0813  Gross per 24 hour  Intake   1448 ml  Output    450 ml  Net    998 ml    ASSESSMENT AND PLAN: 1. 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  Dr. Dorris Fetch will be the one to make the final decision about the timing of surgery.)  2. Acute on chronic renal insufficiency:  Creat about the same.  Diuretics held.  I/O slightly positive with hydration.  Weight stable today.  I appreciate renal help.    3 Acute combined diastolic and systolic CHF - He is not volume overloaded at this point and seemed to handle the IV fluid yesterday.  I will give him another slow infusion.  [name] [facility] 08/22/2011 9:34 AM

## 2011-08-22 NOTE — Consult Note (Signed)
POST OPERATIVE NOTE:  08/22/2011 Patient: Warren [name] MRN:  [id number]  Post op day # 3   VITALS: BP 121/46  Pulse 87  Temp(Src) 97.7 F (36.5 C) (Axillary)  Resp 22  Ht 6\' 3"  (1.905 m)  Wt 175 lb 5 oz (79.52 kg)  BMI 21.91 kg/m2  SpO2 93%  BMET    Component Value Date/Time   NA 136 08/22/2011 0640   K 4.5 08/22/2011 0640   CL 101 08/22/2011 0640   CO2 21 08/22/2011 0640   GLUCOSE 101* 08/22/2011 0640   BUN 67* 08/22/2011 0640   CREATININE 4.18* 08/22/2011 0640   CREATININE 2.19* 06/26/2008 2130   CALCIUM 8.9 08/22/2011 0640   GFRNONAA 14* 08/22/2011 0640   GFRAA 16* 08/22/2011 0640   CBC    Component Value Date/Time   WBC 6.0 08/22/2011 0640   RBC 4.01* 08/22/2011 0640   HGB 9.9* 08/22/2011 0640   HCT 31.7* 08/22/2011 0640   PLT 193 08/22/2011 0640   MCV 79.1 08/22/2011 0640   MCH 24.7* 08/22/2011 0640   MCHC 31.2 08/22/2011 0640   RDW 16.5* 08/22/2011 0640   LYMPHSABS 0.4* 08/21/2011 1317   MONOABS 0.4 08/21/2011 1317   EOSABS 0.0 08/21/2011 1317   BASOSABS 0.0 08/21/2011 1317    Patient is status post extraction of remaining teeth with alveoloplasty and pre-prosthetic surgery on 08/19/11.  SUBJECTIVE: Patient is lying in bed in no acute pain or distress. Patient has not had to use pain medication for mouth pain today by report.   EXAM: No signs of persistent heme or ooze. Sutures are intact. Primary closure noted. Extraoral ecchymoses as before. Tongue with some plaque build up and patient was instructed to brush tongue twice daily.  ASSESSMENT: 1. Post op course is consistent with significant dental procedures in OR on Monday.  PLAN: 1. Continue salt water rinses every two hours as needed to aid healing. 2. Continue chlorhexidine rinses BID. 3. Sutures to dissolve on their own. 4. Decision on timing of valve procedure per Dr. Dorris Fetch.  Charlynne Pander, DDS    Charlynne Pander @TODAY @

## 2011-08-22 NOTE — Progress Notes (Signed)
Patient ID: Warren [name], male   DOB: 1945-12-06, 66 y.o.   MRN: [id number] Feels a little better  Still SOB with minimal activity BP 118/54  Pulse 89  Temp(Src) 98.8 F (37.1 C) (Axillary)  Resp 20  Ht 6\' 3"  (1.905 m)  Wt 79.52 kg (175 lb 5 oz)  BMI 21.91 kg/m2  SpO2 95% Exam unchanged Cr up from 4.0 to 4.2 Noted Dr. Elza Rafter recommendation to delay surgery until AKI resolved and agree. Will hopefully be able to proceed with surgery next week

## 2011-08-22 NOTE — Progress Notes (Addendum)
Subjective: Interval History: no acute events overnight. Weaned to RA. UO 250 ml without diuretics.   Objective:  Vital signs in last 24 hours:  Temp:  [97.4 F (36.3 C)-97.7 F (36.5 C)] 97.7 F (36.5 C) (01/10 0517) Pulse Rate:  [76-87] 87  (01/10 0517) Resp:  [20-22] 22  (01/10 0517) BP: (120-138)/(41-46) 121/46 mmHg (01/10 0517) SpO2:  [92 %-93 %] 93 % (01/10 0517) Weight:  [175 lb 5 oz (79.52 kg)] 175 lb 5 oz (79.52 kg) (01/10 0517) Weight change: 0.7 oz (0.02 kg)  Intake/Output: Intake: 330 PO, 1118 IV.  Urine output: 250 Net: +1198     EXAM: CVS- normal rhythm, no rubs  RS- normal WOB, CTA b/l ABD-NABS, soft, NT, no masses EXT- no edema   Lab Results:  Basename 08/22/11 0640 08/21/11 1317 08/21/11 0637  WBC 6.0 4.8 4.6  HGB 9.9* 10.2* 10.7*  HCT 31.7* 32.6* 34.8*  PLT 193 154 142*   BMET  Basename 08/22/11 0640 08/21/11 0637 08/20/11 0655  NA 136 135 135  K 4.5 5.1 4.5  CL 101 99 100  CO2 21 22 26   GLUCOSE 101* 102* 90  BUN 67* 60* 47*  CREATININE 4.18* 4.00* 3.32*  CALCIUM 8.9 9.4 9.4  PHOS 4.9* -- --   LFT  Basename 08/22/11 0640  PROT --  ALBUMIN 3.2*  AST --  ALT --  ALKPHOS --  BILITOT --  BILIDIR --  IBILI --    UA: pending  PTH: Lab Results  Component Value Date   CALCIUM 8.9 08/22/2011   PHOS 4.9* 08/22/2011      . amLODipine  10 mg Oral Daily  . chlorhexidine  15 mL Mouth/Throat BID  . feeding supplement  237 mL Oral BID  . feeding supplement  1 Container Oral BID BM  . hydrALAZINE  50 mg Oral Q8H  . labetalol  200 mg Oral BID  . neomycin-bacitracin-polymyxin   Topical QID  . sertraline  150 mg Oral Daily  . DISCONTD: sodium chloride  3 mL Intravenous Q12H    Studies/Results: Dg Chest 2 View  08/21/2011  *RADIOLOGY REPORT*  Clinical Data: Shortness of breath.  Follow up right pleural effusion.  CHEST - 2 VIEW 08/21/2011:  Comparison: Portable chest x-ray yesterday, 08/10/2011, and two- view chest x-ray 02/12/2009.   Findings: Prior sternotomy.  Cardiac silhouette mildly enlarged. Thoracic aorta tortuous atherosclerotic, unchanged.  Hilar and mediastinal contours otherwise unremarkable.  Interval improvement in the right pleural effusion, with a small to moderate sized effusion persisting.  Improved aeration at the right lung base, with mild atelectasis persisting.  Left lung remains clear.  IMPRESSION: Improved right pleural effusion, though a small to moderate sized effusion persists.  Improved aeration at the right base, with mild atelectasis persisting.  No new abnormalities.  Original Report Authenticated By: Arnell Sieving, M.D.   US Renal  08/21/2011  *RADIOLOGY REPORT*  Clinical Data: 66 year old male with acute on chronic renal insufficiency.  RENAL/URINARY TRACT ULTRASOUND COMPLETE  Comparison:  CTA abdomen and pelvis 10/21/2008.  Findings:  Right Kidney:  No hydronephrosis.  Renal length 10.0 cm.  No focal renal lesion.  Cortical echotexture is at the upper limits of normal.  Left Kidney:  No hydronephrosis.  Renal length 10.1 cm.  Cortical echotexture at the upper limits of normal.  Bladder:  Unremarkable.  Other findings:  The prostate measures 4.2 x 3.6 x 3.9 cm.  There is a simple appearing right pleural effusion.  IMPRESSION: 1.  No acute renal findings. 2.  Right pleural effusion.  Original Report Authenticated By: Harley Hallmark, M.D.    Assessment/Plan: 66 yo M with known CKD stage 3/4 developed acute kidney injury while in hospital marked by elevated Cr and oliguria.   1. Acute on chronic kidney injury: Baseline Cr 2-2.5. Unclear etiology of CKD as his only risk factor is hypertension. Etiology of AKI also not entirely clear. Suspect acute kidney injury from relative hypotension and dehydration. Increased IVF last night but Cr once again elevated this AM. Rate of rise 0.2-0.5 mg/dL per day. Work-up reveals relatively normal kidneys. UA and urine chemistries pending. No evidence of fluid overload  on exam. Plan to continue IVFs are monitor response. I believe valve surgery should be postponed until renal function starts to improve.  I am concerned that he will be at very high risk for recurrent AKI in the perioperative period, on the basis of this episode of AKI, as well as his significant underlying CKD,    2. 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 No lasix needed at this time. Will increase hydration gently and monitor. Plan is for pt to go to OR this Friday for valve replacement if renal function improves   3. HTN: well controlled on amlodipine.    LOS: 20 Warren [name] 08/22/11 8:50 AM  I have seen and examined this patient and agree with plan as outlined by Dr. Armen Pickup. As above would favor postponing valve surgery.  We will also check C3C4 at outside chance this is delayed atheroemboli after December cath although has no other peripheral stigmata. DUNHAM,CYNTHIA B,MD 08/22/2011 10:30 AM   Addendum to earlier note: took another at UA results and the most recent UA is actually still pending. Note addended to reflect this.  Warren [name] 2:04 PM 08/22/11

## 2011-08-23 LAB — URINALYSIS, ROUTINE W REFLEX MICROSCOPIC
Ketones, ur: NEGATIVE mg/dL
Leukocytes, UA: NEGATIVE
Nitrite: NEGATIVE
Specific Gravity, Urine: 1.018 (ref 1.005–1.030)
Urobilinogen, UA: 0.2 mg/dL (ref 0.0–1.0)
pH: 5 (ref 5.0–8.0)

## 2011-08-23 LAB — HEPATIC FUNCTION PANEL
ALT: 9 U/L (ref 0–53)
AST: 20 U/L (ref 0–37)
Alkaline Phosphatase: 106 U/L (ref 39–117)
Bilirubin, Direct: 0.3 mg/dL (ref 0.0–0.3)
Indirect Bilirubin: 0.4 mg/dL (ref 0.3–0.9)
Total Protein: 5.9 g/dL — ABNORMAL LOW (ref 6.0–8.3)

## 2011-08-23 LAB — URINE MICROSCOPIC-ADD ON

## 2011-08-23 LAB — PROTEIN / CREATININE RATIO, URINE
Protein Creatinine Ratio: 0.3 — ABNORMAL HIGH (ref 0.00–0.15)
Total Protein, Urine: 76.8 mg/dL

## 2011-08-23 LAB — RENAL FUNCTION PANEL
Albumin: 3.3 g/dL — ABNORMAL LOW (ref 3.5–5.2)
BUN: 71 mg/dL — ABNORMAL HIGH (ref 6–23)
Creatinine, Ser: 3.8 mg/dL — ABNORMAL HIGH (ref 0.50–1.35)
Phosphorus: 4.1 mg/dL (ref 2.3–4.6)

## 2011-08-23 LAB — C3 COMPLEMENT: C3 Complement: 78 mg/dL — ABNORMAL LOW (ref 90–180)

## 2011-08-23 MED ORDER — SODIUM CHLORIDE 0.9 % IV SOLN
INTRAVENOUS | Status: DC
  Administered 2011-08-23: 18:00:00 via INTRAVENOUS

## 2011-08-23 NOTE — Progress Notes (Signed)
SUBJECTIVE:  Biggest complaint continues to be nausea which is episodic.  He is having normal BMs.  No abdominal pain.  No fevers.   PHYSICAL EXAM Filed Vitals:   08/22/11 0517 08/22/11 1345 08/22/11 2159 08/23/11 0521  BP: 121/46 118/54 141/59 132/48  Pulse: 87 89 85 78  Temp: 97.7 F (36.5 C) 98.8 F (37.1 C) 97.7 F (36.5 C) 97.7 F (36.5 C)  TempSrc: Axillary Axillary    Resp: 22 20 20 18   Height:      Weight: 175 lb 5 oz (79.52 kg)   174 lb 2.6 oz (79 kg)  SpO2: 93% 95% 94% 93%   General:  No distress Lungs:  Clear Heart:  Unchanged AI Abdomen:  Non tender.  Positive bowel sounds Extremities:  No edema.  LABS:  Results for orders placed during the hospital encounter of 08/02/11 (from the past 24 hour(s))  RENAL FUNCTION PANEL     Status: Abnormal   Collection Time   08/22/11  6:40 AM      Component Value Range   Sodium 136  135 - 145 (mEq/L)   Potassium 4.5  3.5 - 5.1 (mEq/L)   Chloride 101  96 - 112 (mEq/L)   CO2 21  19 - 32 (mEq/L)   Glucose, Bld 101 (*) 70 - 99 (mg/dL)   BUN 67 (*) 6 - 23 (mg/dL)   Creatinine, Ser 6.29 (*) 0.50 - 1.35 (mg/dL)   Calcium 8.9  8.4 - 52.8 (mg/dL)   Phosphorus 4.9 (*) 2.3 - 4.6 (mg/dL)   Albumin 3.2 (*) 3.5 - 5.2 (g/dL)   GFR calc non Af Amer 14 (*) >90 (mL/min)   GFR calc Af Amer 16 (*) >90 (mL/min)  CBC     Status: Abnormal   Collection Time   08/22/11  6:40 AM      Component Value Range   WBC 6.0  4.0 - 10.5 (K/uL)   RBC 4.01 (*) 4.22 - 5.81 (MIL/uL)   Hemoglobin 9.9 (*) 13.0 - 17.0 (g/dL)   HCT 41.3 (*) 24.4 - 52.0 (%)   MCV 79.1  78.0 - 100.0 (fL)   MCH 24.7 (*) 26.0 - 34.0 (pg)   MCHC 31.2  30.0 - 36.0 (g/dL)   RDW 01.0 (*) 27.2 - 15.5 (%)   Platelets 193  150 - 400 (K/uL)  CBC     Status: Abnormal   Collection Time   08/22/11 10:45 AM      Component Value Range   WBC 6.3  4.0 - 10.5 (K/uL)   RBC 4.24  4.22 - 5.81 (MIL/uL)   Hemoglobin 10.5 (*) 13.0 - 17.0 (g/dL)   HCT 53.6 (*) 64.4 - 52.0 (%)   MCV 78.8   78.0 - 100.0 (fL)   MCH 24.8 (*) 26.0 - 34.0 (pg)   MCHC 31.4  30.0 - 36.0 (g/dL)   RDW 03.4 (*) 74.2 - 15.5 (%)   Platelets 177  150 - 400 (K/uL)  C3 COMPLEMENT     Status: Abnormal   Collection Time   08/22/11 10:45 AM      Component Value Range   C3 Complement 78 (*) 90 - 180 (mg/dL)  C4 COMPLEMENT     Status: Normal   Collection Time   08/22/11 10:45 AM      Component Value Range   Complement C4, Body Fluid 17  10 - 40 (mg/dL)  CHLORIDE, URINE, RANDOM     Status: Normal   Collection Time  08/22/11 11:00 PM      Component Value Range   Chloride Urine <25    SODIUM, URINE, RANDOM     Status: Normal   Collection Time   08/22/11 11:00 PM      Component Value Range   Sodium, Ur <10    PROTEIN / CREATININE RATIO, URINE     Status: Abnormal   Collection Time   08/22/11 11:00 PM      Component Value Range   Creatinine, Urine 260.21     Total Protein, Urine 76.8     PROTEIN CREATININE RATIO 0.30 (*) 0.00 - 0.15   URINALYSIS, ROUTINE W REFLEX MICROSCOPIC     Status: Abnormal   Collection Time   08/22/11 11:00 PM      Component Value Range   Color, Urine AMBER (*) YELLOW    APPearance CLOUDY (*) CLEAR    Specific Gravity, Urine 1.018  1.005 - 1.030    pH 5.0  5.0 - 8.0    Glucose, UA NEGATIVE  NEGATIVE (mg/dL)   Hgb urine dipstick NEGATIVE  NEGATIVE    Bilirubin Urine SMALL (*) NEGATIVE    Ketones, ur NEGATIVE  NEGATIVE (mg/dL)   Protein, ur 161 (*) NEGATIVE (mg/dL)   Urobilinogen, UA 0.2  0.0 - 1.0 (mg/dL)   Nitrite NEGATIVE  NEGATIVE    Leukocytes, UA NEGATIVE  NEGATIVE   URINE MICROSCOPIC-ADD ON     Status: Abnormal   Collection Time   08/22/11 11:00 PM      Component Value Range   Squamous Epithelial / LPF FEW (*) RARE    WBC, UA 0-2  <3 (WBC/hpf)   Bacteria, UA FEW (*) RARE    Casts GRANULAR CAST (*) NEGATIVE     Intake/Output Summary (Last 24 hours) at 08/23/11 0960 Last data filed at 08/22/11 2100  Gross per 24 hour  Intake   1400 ml  Output    451 ml  Net     949 ml    ASSESSMENT AND PLAN: 1. 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  2. Acute on chronic renal insufficiency:  Creat pending this am.  Continue to hold diuretics.  I will not hydrate today.  Follow daily creat.  3 Acute combined diastolic and systolic CHF - He is not volume overloaded at this point and seemed to handle the IV fluid yesterday.  4. Nausea: No clear etiology.  I will check liver enzymes.  He is getting morphine IV six times per day.  I will stop this and use Percocet for pain.  Rollene Rotunda 08/23/2011 6:38 AM

## 2011-08-23 NOTE — Progress Notes (Signed)
Patient ID: Warren [name], male   DOB: [birth year], 66 y.o.   MRN: [id number] Feels pretty good this evening BP 129/41  Pulse 68  Temp(Src) 97.8 F (36.6 C) (Axillary)  Resp 18  Ht 6\' 3"  (1.905 m)  Wt 79 kg (174 lb 2.6 oz)  BMI 21.77 kg/m2  SpO2 94% Creatinine down from 4.2 to 3.8 Exam unchanged I will see on Monday, hopefully by then we'll know when he'll be ready for surgery

## 2011-08-23 NOTE — Progress Notes (Signed)
Subjective: Interval History: no acute events overnight. Remains on RA. UO  651 ml without diuretics. Complains of persistent nausea abd dry heaves although he reports feeling better than he did yesterday.   Objective:  Vital signs in last 24 hours:  Temp:  [97.7 F (36.5 C)-98.8 F (37.1 C)] 97.7 F (36.5 C) (01/11 0521) Pulse Rate:  [78-89] 78  (01/11 0521) Resp:  [18-20] 18  (01/11 0521) BP: (118-141)/(48-59) 132/48 mmHg (01/11 0521) SpO2:  [93 %-95 %] 93 % (01/11 0521) Weight:  [174 lb 2.6 oz (79 kg)] 174 lb 2.6 oz (79 kg) (01/11 0521) Weight change: -1 lb 2.4 oz (-0.52 kg)  Intake/Output: Intake: 600 PO, 800 IV.  Urine output: 650 Net: +749     EXAM: CVS- normal rhythm, no rubs  RS- normal WOB, CTA b/l ABD-NABS, soft, NT, no masses EXT- no edema, no asterix but marked tremor SKIN: no plantar emboli on feet  Lab Results:  Basename 08/22/11 1045 08/22/11 0640 08/21/11 1317  WBC 6.3 6.0 4.8  HGB 10.5* 9.9* 10.2*  HCT 33.4* 31.7* 32.6*  PLT 177 193 154   BMET  Basename 08/23/11 0514 08/22/11 0640 08/21/11 0637  NA 137 136 135  K 4.7 4.5 5.1  CL 103 101 99  CO2 21 21 22   GLUCOSE 107* 101* 102*  BUN 71* 67* 60*  CREATININE 3.80* 4.18* 4.00*  CALCIUM 9.1 8.9 9.4  PHOS 4.1 4.9* --   LFT  Basename 08/23/11 0514  PROT --  ALBUMIN 3.3*  AST --  ALT --  ALKPHOS --  BILITOT --  BILIDIR --  IBILI --    UA: 100 protein,  negative blood, small bilirubin and granular cast  U Chem: Cl , 25, Na <10, Cr 260.2, Protein 76.8 FeNA: <.13% Pro/Cr= 0.30   PTH: Lab Results  Component Value Date   CALCIUM 8.9 08/22/2011   PHOS 4.9* 08/22/2011      . amLODipine  10 mg Oral Daily  . chlorhexidine  15 mL Mouth/Throat BID  . feeding supplement  237 mL Oral BID  . feeding supplement  1 Container Oral BID BM  . hydrALAZINE  50 mg Oral Q8H  . labetalol  200 mg Oral BID  . neomycin-bacitracin-polymyxin   Topical QID  . sertraline  150 mg Oral Daily     Studies/Results: Dg Chest 2 View  08/21/2011  *RADIOLOGY REPORT*  Clinical Data: Shortness of breath.  Follow up right pleural effusion.  CHEST - 2 VIEW 08/21/2011:  Comparison: Portable chest x-ray yesterday, 08/10/2011, and two- view chest x-ray 02/12/2009.  Findings: Prior sternotomy.  Cardiac silhouette mildly enlarged. Thoracic aorta tortuous atherosclerotic, unchanged.  Hilar and mediastinal contours otherwise unremarkable.  Interval improvement in the right pleural effusion, with a small to moderate sized effusion persisting.  Improved aeration at the right lung base, with mild atelectasis persisting.  Left lung remains clear.  IMPRESSION: Improved right pleural effusion, though a small to moderate sized effusion persists.  Improved aeration at the right base, with mild atelectasis persisting.  No new abnormalities.  Original Report Authenticated By: Arnell Sieving, M.D.   US Renal  08/21/2011  *RADIOLOGY REPORT*  Clinical Data: 66 year old male with acute on chronic renal insufficiency.  RENAL/URINARY TRACT ULTRASOUND COMPLETE  Comparison:  CTA abdomen and pelvis 10/21/2008.  Findings:  Right Kidney:  No hydronephrosis.  Renal length 10.0 cm.  No focal renal lesion.  Cortical echotexture is at the upper limits of normal.  Left Kidney:  No  hydronephrosis.  Renal length 10.1 cm.  Cortical echotexture at the upper limits of normal.  Bladder:  Unremarkable.  Other findings:  The prostate measures 4.2 x 3.6 x 3.9 cm.  There is a simple appearing right pleural effusion.  IMPRESSION: 1.  No acute renal findings. 2.  Right pleural effusion.  Original Report Authenticated By: Harley Hallmark, M.D.    Assessment/Plan: 66 yo M with 66 known CKD stage 3/4 developed acute kidney injury while in hospital marked by elevated Cr and oliguria.   1. Acute on chronic kidney injury: Baseline Cr 2-2.5. Unclear etiology of CKD as his only risk factor is hypertension. Etiology of AKI also not entirely clear. Suspect  acute kidney injury from relative hypotension and dehydration. Continued IVF last PM. Cr trending down and UO increased. BUN continues to rise.  Work-up reveals relatively normal kidneys on U/S. UA postive for protein, with mid-range proteinuria suggested by Pr/Cr. FeNa consistent with prerenal azotemia.  No evidence of fluid overload on exam. Plan to continue IVFs are monitor response with repeat RFP in the AM. I am actually more concerned that he will experience significant recurrent AKI perioperatively as it did not take a very big hit to see the current worsening of renal function. Will discuss access plan with team. Patient is willing to initiate dialysis if it is suggested. Valve surgery on hold pending improvement in renal function as surgery at this time carries a high risk of worsening CKD. 2. 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 No lasix needed at this time. Will increase hydration gently and monitor. Plan is for pt to go to OR this Friday for valve replacement if renal function improves 3. HTN: well controlled on amlodipine.   LOS: 21 FUNCHES,JOSALYN 08/22/11 7:52 AM  I have seen and examined this patient and agree with plan  as outlined by Dr. Armen Pickup.  Renal function and urine output starting to improve a little and urine studies all support a pre-renal etiology.  I would recommend continuation of fluids for another 24 hours. Maleny Candy B,MD 08/23/2011 11:13 AM

## 2011-08-24 DIAGNOSIS — I359 Nonrheumatic aortic valve disorder, unspecified: Secondary | ICD-10-CM

## 2011-08-24 DIAGNOSIS — I5033 Acute on chronic diastolic (congestive) heart failure: Secondary | ICD-10-CM

## 2011-08-24 LAB — BASIC METABOLIC PANEL
BUN: 66 mg/dL — ABNORMAL HIGH (ref 6–23)
Chloride: 106 mEq/L (ref 96–112)
Creatinine, Ser: 3.13 mg/dL — ABNORMAL HIGH (ref 0.50–1.35)
GFR calc Af Amer: 22 mL/min — ABNORMAL LOW (ref >90)
Glucose, Bld: 101 mg/dL — ABNORMAL HIGH (ref 70–99)
Potassium: 4.3 mEq/L (ref 3.5–5.1)

## 2011-08-24 LAB — RENAL FUNCTION PANEL
BUN: 67 mg/dL — ABNORMAL HIGH (ref 6–23)
CO2: 20 mEq/L (ref 19–32)
Calcium: 8.8 mg/dL (ref 8.4–10.5)
Creatinine, Ser: 3.17 mg/dL — ABNORMAL HIGH (ref 0.50–1.35)
GFR calc non Af Amer: 19 mL/min — ABNORMAL LOW (ref >90)

## 2011-08-24 NOTE — Progress Notes (Signed)
S: Still with some complaints of nausea but not SOB unless moving around  O: Medications: Infusions: . sodium chloride 75 mL/hr at 08/23/11 1730   Scheduled Medications: . amLODipine  10 mg Oral Daily  . chlorhexidine  15 mL Mouth/Throat BID  . feeding supplement  237 mL Oral BID  . feeding supplement  1 Container Oral BID BM  . hydrALAZINE  50 mg Oral Q8H  . labetalol  200 mg Oral BID  . neomycin-bacitracin-polymyxin   Topical QID  . sertraline  150 mg Oral Daily    BP 146/56  Pulse 79  Temp(Src) 97.6 F (36.4 C) (Axillary)  Resp 18  Ht 6\' 3"  (1.905 m)  Wt 72.1 kg (158 lb 15.2 oz)  BMI 19.87 kg/m2  SpO2 91%   Intake/Output Summary (Last 24 hours) at 08/24/11 1257 Last data filed at 08/24/11 0852  Gross per 24 hour  Intake 1897.5 ml  Output    700 ml  Net 1197.5 ml    Weight change: -6.9 kg (-15 lb 3.4 oz)  EXAM: CVS- normal rhythm, no rub; loud murmur of AI at USB and 1/6 mitral murmur at the apex RS- normal WOB, CTA b/l  ABD-NABS, soft, NT, no masses  EXT- no edema, no asterixis  SKIN: no atheroembolic changes  Labs: Basic Metabolic Panel:  Lab 08/24/11 9604 08/24/11 0615 08/23/11 0514 08/22/11 0640  NA 136 136 137 --  K 4.3 4.3 4.7 --  CL 106 105 103 --  CO2 20 20 21  --  GLUCOSE 101* 98 107* --  BUN 66* 67* 71* --  CREATININE 3.13* 3.17* 3.80* --  CALCIUM 8.6 8.8 9.1 --  MG -- -- -- --  PHOS -- 3.5 4.1 4.9*    Liver Function Tests:  Lab 08/24/11 0615 08/23/11 0514 08/22/11 0640  AST -- 20 --  ALT -- 9 --  ALKPHOS -- 106 --  BILITOT -- 0.7 --  PROT -- 5.9* --  ALBUMIN 3.2* 3.3*3.4* 3.2*   CBC:  Lab 08/22/11 1045 08/22/11 0640 08/21/11 1317 08/21/11 0637 08/20/11 0655  WBC 6.3 6.0 4.8 -- --  NEUTROABS -- -- 4.0 -- --  HGB 10.5* 9.9* 10.2* -- --  HCT 33.4* 31.7* 32.6* -- --  MCV 78.8 79.1 79.5 79.8 79.7  PLT 177 193 154 -- --    Assessment/Plan:  66 yo M with known CKD stage 3/4 developed acute kidney injury while in hospital  marked by elevated Cr and oliguria.  1. Acute on chronic kidney injury: Baseline Cr 2-2.5. Unclear etiology of CKD as his only risk factor is hypertension. Etiology of AKI also not entirely clear. Suspect acute kidney injury from relative hypotension and dehydration.FeNa was consistent with prerenal azotemia. Has received IVF.  Feel we could stop those at this point as appears more euvolemic and starting to have some dyspnea with exertion. I am actually more concerned that he will experience significant recurrent AKI perioperatively as it did not take a very big hit to see the current worsening of renal function. Will discuss access plan with team. Patient is willing to initiate dialysis if it is suggested. Valve surgery on hold pending improvement in renal function as surgery at this time carries a high risk of worsening CKD. May need to place permcath at time ov valve procedure. 2. 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 No lasix needed at this time. HTN: well controlled on amlodipine.  Cartier Mapel B

## 2011-08-24 NOTE — Progress Notes (Signed)
Subjective:   No SOB at rest.  Does get sob when gets up.  Nausea comes and goes. Objective: Filed Vitals:   08/23/11 0521 08/23/11 1500 08/23/11 2100 08/24/11 0556  BP: 132/48 129/41 142/60 146/56  Pulse: 78 68 86 79  Temp: 97.7 F (36.5 C) 97.8 F (36.6 C) 97.2 F (36.2 C) 97.6 F (36.4 C)  TempSrc:  Axillary Axillary Axillary  Resp: 18 18 20 18   Height:      Weight: 174 lb 2.6 oz (79 kg)   158 lb 15.2 oz (72.1 kg)  SpO2: 93% 94% 92% 91%   Weight change: -15 lb 3.4 oz (-6.9 kg)  Intake/Output Summary (Last 24 hours) at 08/24/11 0919 Last data filed at 08/24/11 0852  Gross per 24 hour  Intake 1897.5 ml  Output    700 ml  Net 1197.5 ml    General: Alert, awake, oriented x3, in no acute distress Neck:  JVP is normal Heart: Regular rate and rhythm  GrII/VI systolic murmur LSB.  GrII/VI diastolic murmur.   Lungs: Rales at bases.  Decreased BS at R base. Exemities:  No edema.  Pneumatic compression hose on. Neuro: Grossly intact, nonfocal.   Lab Results: Results for orders placed during the hospital encounter of 08/02/11 (from the past 24 hour(s))  RENAL FUNCTION PANEL     Status: Abnormal   Collection Time   08/24/11  6:15 AM      Component Value Range   Sodium 136  135 - 145 (mEq/L)   Potassium 4.3  3.5 - 5.1 (mEq/L)   Chloride 105  96 - 112 (mEq/L)   CO2 20  19 - 32 (mEq/L)   Glucose, Bld 98  70 - 99 (mg/dL)   BUN 67 (*) 6 - 23 (mg/dL)   Creatinine, Ser 4.09 (*) 0.50 - 1.35 (mg/dL)   Calcium 8.8  8.4 - 81.1 (mg/dL)   Phosphorus 3.5  2.3 - 4.6 (mg/dL)   Albumin 3.2 (*) 3.5 - 5.2 (g/dL)   GFR calc non Af Amer 19 (*) >90 (mL/min)   GFR calc Af Amer 22 (*) >90 (mL/min)    Studies/Results: No results found.  Medications: I have reviewed the patient's current medications.   Patient Active Hospital Problem List: RENAL INSUFFICIENCY, CHRONIC (11/16/2008)   Assessment: Cr has improved.  Renal following.     Plan: Aortic regurgitation (08/03/2011)   Assessment:  Plan for AVR once medically optimized.   Plan:  Acute on chronic diastolic heart failure (08/03/2011)   Assessment:   Await further input from renal    Plan:  Mitral regurgitation (08/12/2011)   Assessment: Plan for surgey.   Plan:    LOS: 22 days   Dietrich Pates 08/24/2011, 9:19 AM

## 2011-08-25 LAB — RENAL FUNCTION PANEL
Albumin: 3.2 g/dL — ABNORMAL LOW (ref 3.5–5.2)
BUN: 65 mg/dL — ABNORMAL HIGH (ref 6–23)
CO2: 22 mEq/L (ref 19–32)
Chloride: 105 mEq/L (ref 96–112)
Creatinine, Ser: 3.41 mg/dL — ABNORMAL HIGH (ref 0.50–1.35)
Glucose, Bld: 100 mg/dL — ABNORMAL HIGH (ref 70–99)
Potassium: 4.2 mEq/L (ref 3.5–5.1)

## 2011-08-25 LAB — BASIC METABOLIC PANEL
BUN: 67 mg/dL — ABNORMAL HIGH (ref 6–23)
CO2: 20 mEq/L (ref 19–32)
Chloride: 105 mEq/L (ref 96–112)
GFR calc non Af Amer: 19 mL/min — ABNORMAL LOW (ref >90)
Glucose, Bld: 97 mg/dL (ref 70–99)
Potassium: 4.3 mEq/L (ref 3.5–5.1)
Sodium: 136 mEq/L (ref 135–145)

## 2011-08-25 MED ORDER — SODIUM CHLORIDE 0.9 % IV SOLN
INTRAVENOUS | Status: DC
  Administered 2011-08-25 (×2): via INTRAVENOUS
  Administered 2011-08-26: 50 mL/h via INTRAVENOUS
  Administered 2011-08-27: 16:00:00 via INTRAVENOUS

## 2011-08-25 NOTE — Progress Notes (Signed)
Subjective: Breathing is OK  No CP.  Eager to have surg so he can get teeth Objective: Filed Vitals:   08/24/11 0556 08/24/11 1504 08/24/11 2025 08/25/11 0438  BP: 146/56 126/46 133/46 109/46  Pulse: 79 73 80 82  Temp: 97.6 F (36.4 C) 97.9 F (36.6 C) 97.5 F (36.4 C) 98.3 F (36.8 C)  TempSrc: Axillary Axillary Axillary Axillary  Resp: 18 18 20 20   Height:      Weight: 158 lb 15.2 oz (72.1 kg)   182 lb 12.2 oz (82.9 kg)  SpO2: 91% 90% 93% 94%   Weight change: 23 lb 13 oz (10.8 kg)  Intake/Output Summary (Last 24 hours) at 08/25/11 0933 Last data filed at 08/25/11 0848  Gross per 24 hour  Intake   1220 ml  Output    650 ml  Net    570 ml    General: Alert, awake, oriented x3, in no acute distress Neck:  JVP is normal Heart: Regular rate and rhythm Gr I- II/vi systolic murmur  Gr II/VI diastolic murmur Lungs: Clear to auscultation.  No rales or wheezes. Exemities:  No edema.   Neuro: Grossly intact, nonfocal.   Lab Results: Results for orders placed during the hospital encounter of 08/02/11 (from the past 24 hour(s))  BASIC METABOLIC PANEL     Status: Abnormal   Collection Time   08/24/11  9:40 AM      Component Value Range   Sodium 136  135 - 145 (mEq/L)   Potassium 4.3  3.5 - 5.1 (mEq/L)   Chloride 106  96 - 112 (mEq/L)   CO2 20  19 - 32 (mEq/L)   Glucose, Bld 101 (*) 70 - 99 (mg/dL)   BUN 66 (*) 6 - 23 (mg/dL)   Creatinine, Ser 1.61 (*) 0.50 - 1.35 (mg/dL)   Calcium 8.6  8.4 - 09.6 (mg/dL)   GFR calc non Af Amer 19 (*) >90 (mL/min)   GFR calc Af Amer 22 (*) >90 (mL/min)  PRO B NATRIURETIC PEPTIDE     Status: Abnormal   Collection Time   08/25/11 12:03 AM      Component Value Range   Pro B Natriuretic peptide (BNP) 33383.0 (*) 0 - 125 (pg/mL)  RENAL FUNCTION PANEL     Status: Abnormal   Collection Time   08/25/11  7:00 AM      Component Value Range   Sodium 136  135 - 145 (mEq/L)   Potassium 4.2  3.5 - 5.1 (mEq/L)   Chloride 105  96 - 112 (mEq/L)   CO2  22  19 - 32 (mEq/L)   Glucose, Bld 100 (*) 70 - 99 (mg/dL)   BUN 65 (*) 6 - 23 (mg/dL)   Creatinine, Ser 0.45 (*) 0.50 - 1.35 (mg/dL)   Calcium 8.9  8.4 - 40.9 (mg/dL)   Phosphorus 3.6  2.3 - 4.6 (mg/dL)   Albumin 3.2 (*) 3.5 - 5.2 (g/dL)   GFR calc non Af Amer 17 (*) >90 (mL/min)   GFR calc Af Amer 20 (*) >90 (mL/min)    Studies/Results: No results found.  Medications: I have reviewed the patient's current medications.   Patient Active Hospital Problem List: RENAL INSUFFICIENCY, CHRONIC (11/16/2008)   Assessment: Cr is a little higher than yesterday.  Reviewed renal eval from yesterday.  Await further recs.  Surg to see tomorrow.   Plan:  Aortic regurgitation (08/03/2011)   Assessment: Plan for surgery.   Plan:  Acute on chronic diastolic heart  failure (08/03/2011)   Assessment:  Volume control limited by renal function.  Currently off IV fluids.  NOt bad but await further input from renal re further hydration.   Plan: Mitral regurgitation (08/12/2011)   Assessment:   Plan for surgery.   Plan:    LOS: 23 days   Dietrich Pates 08/25/2011, 9:33 AM

## 2011-08-25 NOTE — Progress Notes (Signed)
Subjective: Interval History: Stopped IVF yesterday due to SOB - feels better today No new complaints  Objective:  Vital signs in last 24 hours:  Temp:  [97.5 F (36.4 C)-98.3 F (36.8 C)] 98.1 F (36.7 C) (01/13 1413) Pulse Rate:  [67-82] 67  (01/13 1413) Resp:  [19-20] 19  (01/13 1413) BP: (109-133)/(40-58) 124/40 mmHg (01/13 1413) SpO2:  [92 %-94 %] 92 % (01/13 1413) Weight:  [82.9 kg (182 lb 12.2 oz)] 82.9 kg (182 lb 12.2 oz) (01/13 0438) Weight change: 10.8 kg (23 lb 13 oz)  Intake/Output: I/O last 3 completed shifts: In: 2997.5 [P.O.:2060; I.V.:937.5] Out: 1400 [Urine:1400]  Intake/Output this shift:  Total I/O In: 240 [P.O.:240] Out: 150 [Urine:150]  MEDS . amLODipine  10 mg Oral Daily  . chlorhexidine  15 mL Mouth/Throat BID  . feeding supplement  237 mL Oral BID  . feeding supplement  1 Container Oral BID BM  . hydrALAZINE  50 mg Oral Q8H  . labetalol  200 mg Oral BID  . neomycin-bacitracin-polymyxin   Topical QID  . sertraline  150 mg Oral Daily   EXAM: Long haired gentleman; bruising of the face from tooth extractions CVS- normal rhythm, no rub; loud murmur of AI at USB and 1/6 mitral murmur at the apex  RS- normal WOB, decreased BS at right base; left side clear ABD-NABS, soft, NT, no masses  EXT- no edema, no asterixis  SKIN: no atheroembolic changes  Lab Results: No results found for this basename: WBC:3,HGB:3,HCT:3,PLT:3 in the last 72 hours BMET  Basename 08/25/11 1100 08/25/11 0700 08/24/11 0940 08/24/11 0615 08/23/11 0514  NA 136 136 136 -- --  K 4.3 4.2 4.3 -- --  CL 105 105 106 -- --  CO2 20 22 20  -- --  GLUCOSE 97 100* 101* -- --  BUN 67* 65* 66* -- --  CREATININE 3.24* 3.41* 3.13* -- --  CALCIUM 9.1 8.9 8.6 -- --  PHOS -- 3.6 -- 3.5 4.1   LFT  Basename 08/25/11 0700 08/23/11 0514  PROT -- 5.9*  ALBUMIN 3.2* --  AST -- 20  ALT -- 9  ALKPHOS -- 106  BILITOT -- 0.7  BILIDIR -- 0.3  IBILI -- 0.4   PT/INR No results found for  this basename: LABPROT:2,INR:2 in the last 72 hours Hepatitis Panel No results found for this basename: HEPBSAG,HCVAB,HEPAIGM,HEPBIGM in the last 72 hours PTH: Lab Results  Component Value Date   PTH 61.2 08/21/2011   CALCIUM 9.1 08/25/2011   CAION 1.11* 10/24/2008   PHOS 3.6 08/25/2011    Studies/Results: No results found.  Assessment/Plan: 66 yo M with known CKD stage 3/4 developed acute kidney injury while in hospital marked by elevated Cr and oliguria, with only identifiable insults possible volume depletion with some hypotension at time of dental extractions.  1. Acute on chronic kidney injury: Baseline Cr 2-2.5 (see initial consult note for trends). 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 Etiology of AKI also not entirely clear. Suspected acute kidney injury from relative hypotension during mouth surgery and volume depletion. FeNa was consistent with prerenal azotemia. Stopped IVF yesterday due to some SOB although he is not at all dyspneic today, has no peripheral edema whatsoever. I'd like to give him a bit more fluid over the next 24 hours and re-assess.   I am most concerned that he will experience significant recurrent AKI perioperatively as it did not take a very big hit  to see the current worsening of renal function. Will discuss access plan with team. Patient is willing to initiate dialysis if it is suggested. Valve surgery on hold pending improvement in renal function as surgery at this time carries a high risk of worsening CKD. I would say if renal function about the same tomorrow that could proceed with valve surgery but place permcath at time of surgery and save an arm for permanent vascular access 2. 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 No lasix needed at this time. HTN: well controlled on amlodipine.   LOS: 23 Jaylean Buenaventura B @TODAY @3 :25 PM

## 2011-08-26 ENCOUNTER — Inpatient Hospital Stay (HOSPITAL_COMMUNITY): Payer: Medicare Other

## 2011-08-26 DIAGNOSIS — I359 Nonrheumatic aortic valve disorder, unspecified: Secondary | ICD-10-CM

## 2011-08-26 LAB — UIFE/LIGHT CHAINS/TP QN, 24-HR UR
Albumin, U: DETECTED
Free Lambda Lt Chains,Ur: 0.79 mg/dL — ABNORMAL HIGH (ref 0.02–0.67)

## 2011-08-26 LAB — RENAL FUNCTION PANEL
Albumin: 3.1 g/dL — ABNORMAL LOW (ref 3.5–5.2)
BUN: 60 mg/dL — ABNORMAL HIGH (ref 6–23)
CO2: 16 mEq/L — ABNORMAL LOW (ref 19–32)
Chloride: 106 mEq/L (ref 96–112)
Creatinine, Ser: 2.76 mg/dL — ABNORMAL HIGH (ref 0.50–1.35)
Glucose, Bld: 93 mg/dL (ref 70–99)
Potassium: 4.2 mEq/L (ref 3.5–5.1)

## 2011-08-26 MED ORDER — FUROSEMIDE 10 MG/ML IJ SOLN
40.0000 mg | Freq: Once | INTRAMUSCULAR | Status: AC
  Administered 2011-08-26: 40 mg via INTRAVENOUS
  Filled 2011-08-26: qty 4

## 2011-08-26 MED ORDER — BOOST / RESOURCE BREEZE PO LIQD
1.0000 | Freq: Three times a day (TID) | ORAL | Status: DC
  Administered 2011-08-26 – 2011-08-27 (×4): 1 via ORAL

## 2011-08-26 MED ORDER — AMLODIPINE BESYLATE 5 MG PO TABS
5.0000 mg | ORAL_TABLET | Freq: Every day | ORAL | Status: DC
  Administered 2011-08-27: 5 mg via ORAL
  Filled 2011-08-26 (×2): qty 1

## 2011-08-26 MED ORDER — ALPRAZOLAM 0.25 MG PO TABS
0.2500 mg | ORAL_TABLET | ORAL | Status: DC | PRN
  Administered 2011-08-27: 0.25 mg via ORAL

## 2011-08-26 NOTE — Progress Notes (Signed)
Pt c/o S.O.B. 02 sat 95 on 3 L n/c bp 98/48 pulse pt denies any pain with deep breath . PA [name] here for [facility] Cardiology. Examined pt and stated she will call Dr Antoine Poche. Pt given 0.25 of Xanax. C/o visual disturbance. Complete set of neuro checks done no abnormal findings.        [facility] RN

## 2011-08-26 NOTE — Progress Notes (Signed)
Subjective: Interval History: No new complaints. Currently NPO for possible valve replacement surgery today. Net positive 725 ml with 575 ml of UO without diuretics.   Objective:  Vital signs in last 24 hours:  Temp:  [97.9 F (36.6 C)-98.1 F (36.7 C)] 97.9 F (36.6 C) (01/14 0531) Pulse Rate:  [67-79] 79  (01/14 0531) Resp:  [18-19] 18  (01/14 0531) BP: (119-144)/(40-58) 144/45 mmHg (01/14 0531) SpO2:  [92 %-93 %] 93 % (01/14 0531) Weight:  [183 lb 3.2 oz (83.1 kg)] 183 lb 3.2 oz (83.1 kg) (01/14 0531) Weight change: 7.1 oz (0.2 kg)  Intake/Output: I/O last 3 completed shifts: In: 1780 [P.O.:1180; I.V.:600] Out: 875 [Urine:875]  Intake/Output this shift:     MEDS:     . amLODipine  10 mg Oral Daily  . chlorhexidine  15 mL Mouth/Throat BID  . feeding supplement  1 Container Oral TID WC  . hydrALAZINE  50 mg Oral Q8H  . labetalol  200 mg Oral BID  . neomycin-bacitracin-polymyxin   Topical QID  . sertraline  150 mg Oral Daily  . DISCONTD: feeding supplement  237 mL Oral BID  . DISCONTD: feeding supplement  1 Container Oral BID BM      . sodium chloride 50 mL/hr at 08/25/11 2207  ALPRAZolam, fentaNYL, HYDROcodone-acetaminophen, midazolam, ondansetron, oxyCODONE-acetaminophen, traZODone, zolpidem  EXAM: Long haired gentleman; bruising of the face from tooth extractions CVS- normal rhythm, no rub; loud murmur of AI at USB and 1/6 mitral murmur at the apex  RS- normal WOB, scattered crackles b/l base R>L,  no rales ABD-NABS, soft, NT, no masses  EXT- no edema    Lab Results: No results found for this basename: WBC:3,HGB:3,HCT:3,PLT:3 in the last 72 hours BMET  [facility] 08/26/11 0644 08/25/11 1100 08/25/11 0700 08/24/11 0615  NA 136 136 136 --  K 4.2 4.3 4.2 --  CL 106 105 105 --  CO2 16* 20 22 --  GLUCOSE 93 97 100* --  BUN 60* 67* 65* --  CREATININE 2.76* 3.24* 3.41* --  CALCIUM 8.9 9.1 8.9 --  PHOS 3.5 -- 3.6 3.5   LFT  Basename 08/25/11 0700  PROT --    ALBUMIN 3.2*  AST --  ALT --  ALKPHOS --  BILITOT --  BILIDIR --  IBILI --   PT/INR No results found for this basename: LABPROT:2,INR:2 in the last 72 hours Hepatitis Panel No results found for this basename: HEPBSAG,HCVAB,HEPAIGM,HEPBIGM in the last 72 hours PTH: Lab Results  Component Value Date   PTH 61.2 08/21/2011   CALCIUM 9.1 08/25/2011   CAION 1.11* 10/24/2008   PHOS 3.6 08/25/2011    Studies/Results: No results found.  Assessment/Plan: 66 yo M with known CKD stage 3/4 developed acute kidney injury while in hospital marked by elevated Cr and oliguria, with only identifiable insults possible volume depletion with some hypotension at time of dental extractions.   1. Acute on chronic kidney injury: Improving. Cr down to 2.76 today. Baseline Cr 2-2.5 (see initial consult note for trends). 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 Etiology of AKI also not entirely clear. Suspected acute kidney injury from relative hypotension during mouth surgery and volume depletion. FeNa was consistent with prerenal azotemia. I am most concerned that he will experience significant recurrent AKI perioperatively as it did not take a very big hit to see the current worsening of renal function. Patient is willing to initiate dialysis if it is  suggested. Valve surgery on hold pending improvement in renal function as surgery at this time carries a high risk of worsening CKD. Patient currently NPO on 50 ml/hr of NS. With improvement in renal function I say proceed with valve surgery but place permcath at time of surgery and save an arm for permanent vascular access. 2. 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 No lasix needed at this time. HTN: well controlled on amlodipine.   LOS: 24 FUNCHES,JOSALYN 1/14/128:24 AM  I have seen and examined this patient and agree with the assessment/plan as outlined above by Dessa Phi MD (PGY  2). Renal function continues to improve indicating some recovery from acute on chronic renal insufficiency. The patient is currently n.p.o. and unclear as to whether or not scheduled to undergo valve replacement surgery today. If he does undergo this, the plans are to have a tunneled dialysis catheter placed at the same time in order to facilitate dialysis if urgently or emergently required. Both volume status and electrolytes at this time appear to be stable and without any need for intervention.  Shane Badeaux K.,MD 08/26/2011 12:06 PM

## 2011-08-26 NOTE — Progress Notes (Signed)
7 Days Post-Op Procedure(s) (LRB): MULTIPLE EXTRACION WITH ALVEOLOPLASTY (N/A) Subjective: Feels better currently Had SOB earlier, responded to lasix, may have gotten a little over the fine line on volume status.  Objective: Vital signs in last 24 hours: Temp:  [97.6 F (36.4 C)-98 F (36.7 C)] 97.6 F (36.4 C) (01/14 1419) Pulse Rate:  [70-79] 73  (01/14 1419) Cardiac Rhythm:  [-] Normal sinus rhythm (01/14 0800) Resp:  [18-22] 20  (01/14 1419) BP: (98-144)/(43-49) 125/43 mmHg (01/14 1419) SpO2:  [93 %-96 %] 96 % (01/14 1419) Weight:  [183 lb 3.2 oz (83.1 kg)] 183 lb 3.2 oz (83.1 kg) (01/14 0531)  Hemodynamic parameters for last 24 hours:    Intake/Output from previous day: 01/13 0701 - 01/14 0700 In: 1300 [P.O.:700; I.V.:600] Out: 575 [Urine:575] Intake/Output this shift: Total I/O In: 480 [P.O.:480] Out: 440 [Urine:440]  General appearance: alert and no distress Lungs- basilar rales Cardiac RRR, systolic and diastolic murmurs No peripheral edema  Lab Results: No results found for this basename: WBC:2,HGB:2,HCT:2,PLT:2 in the last 72 hours BMET:  [facility] 08/26/11 0644 08/25/11 1100  NA 136 136  K 4.2 4.3  CL 106 105  CO2 16* 20  GLUCOSE 93 97  BUN 60* 67*  CREATININE 2.76* 3.24*  CALCIUM 8.9 9.1    PT/INR: No results found for this basename: LABPROT,INR in the last 72 hours ABG    Component Value Date/Time   PHART 7.508* 10/22/2008 1345   HCO3 20.0 10/22/2008 1345   TCO2 28 10/24/2008 1805   ACIDBASEDEF 2.0 10/22/2008 1345   O2SAT 99.0 10/22/2008 1345   CBG (last 3)  No results found for this basename: GLUCAP:3 in the last 72 hours  Assessment/Plan: S/P Procedure(s) (LRB): MULTIPLE EXTRACION WITH ALVEOLOPLASTY (N/A) Severe AI and MR He is on schedule for Wednesday 1/16 for redo sternotomy, AVR, MV repair/ replacement I have noted Dr. Elza Rafter recommendation for permcath at time of cardiac surgery but it is not practical in his case given the magnitude  of the cardiac procedure. Temporary or permanent access can be placed in the early postoperative period if necessary, which it likely will be.   LOS: 24 days    Warren [name] C 08/26/2011

## 2011-08-26 NOTE — Progress Notes (Signed)
   SUBJECTIVE:  He is not nauseated and his mouth pain is less. He is not in any respiratory distress  PHYSICAL EXAM Filed Vitals:   08/25/11 1019 08/25/11 1413 08/25/11 2028 08/26/11 0531  BP: 131/58 124/40 119/49 144/45  Pulse:  67 74 79  Temp:  98.1 F (36.7 C) 98 F (36.7 C) 97.9 F (36.6 C)  TempSrc:  Oral Axillary Axillary  Resp:  19 18 18   Height:      Weight:    183 lb 3.2 oz (83.1 kg)  SpO2:  92% 93% 93%   General:  No distress Lungs:  Clear Heart:  Unchanged AI Abdomen:  Non tender.  Positive bowel sounds Extremities:  No edema.  LABS:  Results for orders placed during the hospital encounter of 08/02/11 (from the past 24 hour(s))  BASIC METABOLIC PANEL     Status: Abnormal   Collection Time   08/25/11 11:00 AM      Component Value Range   Sodium 136  135 - 145 (mEq/L)   Potassium 4.3  3.5 - 5.1 (mEq/L)   Chloride 105  96 - 112 (mEq/L)   CO2 20  19 - 32 (mEq/L)   Glucose, Bld 97  70 - 99 (mg/dL)   BUN 67 (*) 6 - 23 (mg/dL)   Creatinine, Ser 1.61 (*) 0.50 - 1.35 (mg/dL)   Calcium 9.1  8.4 - 09.6 (mg/dL)   GFR calc non Af Amer 19 (*) >90 (mL/min)   GFR calc Af Amer 22 (*) >90 (mL/min)  RENAL FUNCTION PANEL     Status: Abnormal   Collection Time   08/26/11  6:44 AM      Component Value Range   Sodium 136  135 - 145 (mEq/L)   Potassium 4.2  3.5 - 5.1 (mEq/L)   Chloride 106  96 - 112 (mEq/L)   CO2 16 (*) 19 - 32 (mEq/L)   Glucose, Bld 93  70 - 99 (mg/dL)   BUN 60 (*) 6 - 23 (mg/dL)   Creatinine, Ser 0.45 (*) 0.50 - 1.35 (mg/dL)   Calcium 8.9  8.4 - 40.9 (mg/dL)   Phosphorus 3.5  2.3 - 4.6 (mg/dL)   Albumin 3.1 (*) 3.5 - 5.2 (g/dL)   GFR calc non Af Amer 23 (*) >90 (mL/min)   GFR calc Af Amer 26 (*) >90 (mL/min)    Intake/Output Summary (Last 24 hours) at 08/26/11 0854 Last data filed at 08/26/11 0620  Gross per 24 hour  Intake   1180 ml  Output    425 ml  Net    755 ml    ASSESSMENT AND PLAN: 1. Severe aortic insufficiency/mitral regurgitation  with prolapse (echo at [facility]):  awaiting valve surgery.  Timing per Dr. Dorris Fetch.  He is NPO per anesthesia.  However, I don't know that any procedures are planned for today.  I will ask the nursing to investigate.    2. Acute on chronic renal insufficiency:  Creat is improved.  Plan as outlined per Dr. Eliott Nine.  He is still likely to have worsening renal function after surgery although it is good to see the creat currently going in the right direction.    3 Acute combined diastolic and systolic CHF - He is still not volume overloaded at this point.  Still holding po diuretics.   [name] [facility] 08/26/2011 8:54 AM

## 2011-08-26 NOTE — Progress Notes (Signed)
Port CXR done and Lasix 40 mg given IV

## 2011-08-26 NOTE — Progress Notes (Signed)
Nutrition Follow-up  Diet Order: Clear Liquids   Meds: Scheduled Meds:   . amLODipine  10 mg Oral Daily  . chlorhexidine  15 mL Mouth/Throat BID  . feeding supplement  237 mL Oral BID  . feeding supplement  1 Container Oral BID BM  . hydrALAZINE  50 mg Oral Q8H  . labetalol  200 mg Oral BID  . neomycin-bacitracin-polymyxin   Topical QID  . sertraline  150 mg Oral Daily   Continuous Infusions:   . sodium chloride 50 mL/hr at 08/25/11 2207   PRN Meds:.ALPRAZolam, fentaNYL, HYDROcodone-acetaminophen, midazolam, ondansetron, oxyCODONE-acetaminophen, traZODone, zolpidem  Labs:  CMP     Component Value Date/Time   NA 136 08/26/2011 0644   K 4.2 08/26/2011 0644   CL 106 08/26/2011 0644   CO2 16* 08/26/2011 0644   GLUCOSE 93 08/26/2011 0644   BUN 60* 08/26/2011 0644   CREATININE 2.76* 08/26/2011 0644   CREATININE 2.19* 06/26/2008 2130   CALCIUM 8.9 08/26/2011 0644   PROT 5.9* 08/23/2011 0514   ALBUMIN 3.1* 08/26/2011 0644   AST 20 08/23/2011 0514   ALT 9 08/23/2011 0514   ALKPHOS 106 08/23/2011 0514   BILITOT 0.7 08/23/2011 0514   GFRNONAA 23* 08/26/2011 0644   GFRAA 26* 08/26/2011 0644     Intake/Output Summary (Last 24 hours) at 08/26/11 1021 Last data filed at 08/26/11 1610  Gross per 24 hour  Intake   1180 ml  Output    425 ml  Net    755 ml    Weight Status:  183 lbs, trending up since admission, 8 lbs in 1 week  Nutrition Dx: Inadequate Oral intake related to clear liquid diet for 7 days as evidenced by albumin trending down  Goal:  PO intake to be >75% unmet.   Intervention:  Change Ensure and Ensure pudding to Resource Breeze TID. If diet advances may change supplements back to Ensure.   Monitor:  PO intake, diet, I/O's, weights, labs   Rudean Haskell Pager #:  [phone]

## 2011-08-26 NOTE — Progress Notes (Signed)
Called to see patient re: SOB. This afternoon he developed acute worsening of SOB and intermittent blurry vision. Neuro exam completely nonfocal. He is generally comfortable appearing although with some mild increased WOB. Exam reveals decrease BS RLL with some crackles 1/2 way up, otherwise BS coarse throughout. BP is soft compared to usual readings - 98/48. Tele: NSR frequent PVCs. K was 4.2 today. Denies pain with inspiration. Has been on TED hose for DVT ppx. Off diuretics for the last few days secondary to renal insufficiency which is better today. Dr. Jens Som also assessed at bedside and at this time, the patient endorses some improvement with his breathing. Visual sx have resolved, ?secondary to transient low blood pressure. O2 sats 95% on 3L consistent with prior. Per discussion with Dr. Jens Som, will obtain stat CXR, decrease Norvasc to 5mg  daily to allow for BP room, and give 40mg  IV Lasix. Will continue to monitor.  Dayna Dunn PA-C

## 2011-08-27 DIAGNOSIS — D649 Anemia, unspecified: Secondary | ICD-10-CM

## 2011-08-27 LAB — TYPE AND SCREEN
ABO/RH(D): A POS
Unit division: 0
Unit division: 0
Unit division: 0
Unit division: 0
Unit division: 0

## 2011-08-27 LAB — URINALYSIS, ROUTINE W REFLEX MICROSCOPIC
Bilirubin Urine: NEGATIVE
Bilirubin Urine: NEGATIVE
Glucose, UA: NEGATIVE mg/dL
Glucose, UA: NEGATIVE mg/dL
Hgb urine dipstick: NEGATIVE
Ketones, ur: NEGATIVE mg/dL
Protein, ur: NEGATIVE mg/dL
Specific Gravity, Urine: 1.011 (ref 1.005–1.030)
Urobilinogen, UA: 0.2 mg/dL (ref 0.0–1.0)
pH: 5 (ref 5.0–8.0)
pH: 5 (ref 5.0–8.0)

## 2011-08-27 LAB — RENAL FUNCTION PANEL
Albumin: 3.1 g/dL — ABNORMAL LOW (ref 3.5–5.2)
BUN: 51 mg/dL — ABNORMAL HIGH (ref 6–23)
Chloride: 106 mEq/L (ref 96–112)
GFR calc Af Amer: 34 mL/min — ABNORMAL LOW (ref >90)
Glucose, Bld: 88 mg/dL (ref 70–99)
Phosphorus: 3.2 mg/dL (ref 2.3–4.6)
Potassium: 3.6 mEq/L (ref 3.5–5.1)
Sodium: 138 mEq/L (ref 135–145)

## 2011-08-27 LAB — BLOOD GAS, ARTERIAL
Acid-base deficit: 4.7 mmol/L — ABNORMAL HIGH (ref 0.0–2.0)
O2 Saturation: 88.3 %
TCO2: 21.2 mmol/L (ref 0–100)
pCO2 arterial: 37.9 mmHg (ref 35.0–45.0)
pO2, Arterial: 57.2 mmHg — ABNORMAL LOW (ref 80.0–100.0)

## 2011-08-27 LAB — COMPREHENSIVE METABOLIC PANEL
ALT: 9 U/L (ref 0–53)
Alkaline Phosphatase: 102 U/L (ref 39–117)
BUN: 47 mg/dL — ABNORMAL HIGH (ref 6–23)
Chloride: 107 mEq/L (ref 96–112)
GFR calc Af Amer: 37 mL/min — ABNORMAL LOW (ref >90)
Glucose, Bld: 102 mg/dL — ABNORMAL HIGH (ref 70–99)
Potassium: 3.8 mEq/L (ref 3.5–5.1)
Sodium: 140 mEq/L (ref 135–145)
Total Bilirubin: 0.8 mg/dL (ref 0.3–1.2)
Total Protein: 5.4 g/dL — ABNORMAL LOW (ref 6.0–8.3)

## 2011-08-27 LAB — PROTEIN / CREATININE RATIO, URINE
Creatinine, Urine: 71.73 mg/dL
Protein Creatinine Ratio: 0.15 (ref 0.00–0.15)
Total Protein, Urine: 10.8 mg/dL

## 2011-08-27 LAB — CHLORIDE, URINE, RANDOM: Chloride Urine: 57 mEq/L

## 2011-08-27 LAB — PROTIME-INR: Prothrombin Time: 20.3 seconds — ABNORMAL HIGH (ref 11.6–15.2)

## 2011-08-27 LAB — SODIUM, URINE, RANDOM: Sodium, Ur: 35 mEq/L

## 2011-08-27 MED ORDER — SODIUM CHLORIDE 0.9 % IV SOLN
INTRAVENOUS | Status: DC
  Filled 2011-08-27: qty 1

## 2011-08-27 MED ORDER — EPINEPHRINE HCL 1 MG/ML IJ SOLN
0.5000 ug/min | INTRAVENOUS | Status: DC
  Filled 2011-08-27: qty 4

## 2011-08-27 MED ORDER — POTASSIUM CHLORIDE CRYS ER 20 MEQ PO TBCR
40.0000 meq | EXTENDED_RELEASE_TABLET | Freq: Two times a day (BID) | ORAL | Status: AC
  Administered 2011-08-27 (×2): 40 meq via ORAL
  Filled 2011-08-27 (×2): qty 2

## 2011-08-27 MED ORDER — CHLORHEXIDINE GLUCONATE 4 % EX LIQD
60.0000 mL | Freq: Once | CUTANEOUS | Status: DC
  Filled 2011-08-27: qty 60

## 2011-08-27 MED ORDER — FUROSEMIDE 10 MG/ML IJ SOLN
160.0000 mg | Freq: Three times a day (TID) | INTRAVENOUS | Status: DC
  Administered 2011-08-27: 160 mg via INTRAVENOUS
  Filled 2011-08-27 (×2): qty 16

## 2011-08-27 MED ORDER — VERAPAMIL HCL 2.5 MG/ML IV SOLN
INTRAVENOUS | Status: AC
  Administered 2011-08-28: 11:00:00
  Filled 2011-08-27: qty 2.5

## 2011-08-27 MED ORDER — POTASSIUM CHLORIDE 2 MEQ/ML IV SOLN
80.0000 meq | INTRAVENOUS | Status: DC
  Filled 2011-08-27: qty 40

## 2011-08-27 MED ORDER — DOPAMINE-DEXTROSE 3.2-5 MG/ML-% IV SOLN
2.0000 ug/kg/min | INTRAVENOUS | Status: DC
  Filled 2011-08-27: qty 250

## 2011-08-27 MED ORDER — MAGNESIUM SULFATE 50 % IJ SOLN
40.0000 meq | INTRAMUSCULAR | Status: DC
  Filled 2011-08-27: qty 10

## 2011-08-27 MED ORDER — DEXTROSE 5 % IV SOLN
750.0000 mg | INTRAVENOUS | Status: DC
  Filled 2011-08-27: qty 750

## 2011-08-27 MED ORDER — DIAZEPAM 5 MG PO TABS
5.0000 mg | ORAL_TABLET | Freq: Once | ORAL | Status: AC
  Administered 2011-08-28: 5 mg via ORAL
  Filled 2011-08-27: qty 1

## 2011-08-27 MED ORDER — PHENYLEPHRINE HCL 10 MG/ML IJ SOLN
30.0000 ug/min | INTRAVENOUS | Status: DC
  Filled 2011-08-27: qty 2

## 2011-08-27 MED ORDER — NITROGLYCERIN IN D5W 200-5 MCG/ML-% IV SOLN
2.0000 ug/min | INTRAVENOUS | Status: DC
  Filled 2011-08-27: qty 250

## 2011-08-27 MED ORDER — SODIUM CHLORIDE 0.9 % IV SOLN
0.1000 ug/kg/h | INTRAVENOUS | Status: DC
  Filled 2011-08-27: qty 4

## 2011-08-27 MED ORDER — SODIUM CHLORIDE 0.9 % IV SOLN
INTRAVENOUS | Status: DC
  Filled 2011-08-27: qty 40

## 2011-08-27 MED ORDER — CHLORHEXIDINE GLUCONATE 4 % EX LIQD
60.0000 mL | Freq: Once | CUTANEOUS | Status: AC
  Administered 2011-08-27: 4 via TOPICAL
  Filled 2011-08-27: qty 60

## 2011-08-27 MED ORDER — VANCOMYCIN HCL 1000 MG IV SOLR
1250.0000 mg | INTRAVENOUS | Status: AC
  Administered 2011-08-28: 1250 mg via INTRAVENOUS
  Filled 2011-08-27: qty 1250

## 2011-08-27 MED ORDER — TEMAZEPAM 15 MG PO CAPS: 15.0000 mg | ORAL_CAPSULE | Freq: Once | ORAL | Status: AC | PRN

## 2011-08-27 MED ORDER — DEXTROSE 5 % IV SOLN
1.5000 g | INTRAVENOUS | Status: AC
  Administered 2011-08-28: 1.5 g via INTRAVENOUS
  Administered 2011-08-28: 750 g via INTRAVENOUS
  Filled 2011-08-27: qty 1.5

## 2011-08-27 MED ORDER — BISACODYL 5 MG PO TBEC
5.0000 mg | DELAYED_RELEASE_TABLET | Freq: Once | ORAL | Status: AC
  Administered 2011-08-27: 5 mg via ORAL
  Filled 2011-08-27: qty 1

## 2011-08-27 MED ORDER — METOPROLOL TARTRATE 12.5 MG HALF TABLET
12.5000 mg | ORAL_TABLET | Freq: Once | ORAL | Status: AC
  Administered 2011-08-28: 12.5 mg via ORAL
  Filled 2011-08-27: qty 1

## 2011-08-27 NOTE — Progress Notes (Signed)
Patient Name: Warren [name] Date of Encounter: 08/27/2011  Active Problems:  RENAL INSUFFICIENCY, CHRONIC  Aortic regurgitation  Acute on chronic diastolic heart failure  Mitral regurgitation    SUBJECTIVE:Very fatigued and SOB from having BM but says DOE is where he has been, not changed. Feels that he will not get any better until surgery. Would like to eat more solid food. PO intake is poor because of restrictions.    OBJECTIVE  Filed Vitals:   08/26/11 2248 08/27/11 0300 08/27/11 0629 08/27/11 1009  BP:  135/43 124/49 154/57  Pulse: 70 84  82  Temp:  97.7 F (36.5 C)    TempSrc:  Axillary    Resp:  20    Height:      Weight:  181 lb 11.2 oz (82.419 kg)    SpO2:  91%      Intake/Output Summary (Last 24 hours) at 08/27/11 1141 Last data filed at 08/27/11 0956  Gross per 24 hour  Intake    840 ml  Output   1670 ml  Net   -830 ml   Weight change: -1 lb 8 oz (-0.682 kg)  PHYSICAL EXAM  General: Well developed, well nourished, white male, significant DOE. Head: Normocephalic, atraumatic.  Neck: Supple without bruits, JVD at 10cm. Lungs:  Resp regular and rapid with exertion, decreased breath sounds both bases, right much greater than left. Heart: slightly irregular rate and rhythm, ? S3, no s4, systolic and diastolic murmurs present. Abdomen: Soft, non-tender, non-distended, BS + x 4.  Extremities: No clubbing, cyanosis, no edema.  Neuro: Alert and oriented X 3. Moves all extremities spontaneously.   LABS:  CBC: Lab Results  Component Value Date   WBC 6.3 08/22/2011   HGB 10.5* 08/22/2011   HCT 33.4* 08/22/2011   MCV 78.8 08/22/2011   PLT 177 08/22/2011  ] Basic Metabolic Panel:  Basename 08/27/11 0515 08/26/11 0644  NA 138 136  K 3.6 4.2  CL 106 106  CO2 19 16*  GLUCOSE 88 93  BUN 51* 60*  CREATININE 2.21* 2.76*  CALCIUM 8.4 8.9  MG -- --  PHOS 3.2 3.5   Liver Function Tests:  Basename 08/27/11 0515 08/26/11 0644  AST -- --  ALT -- --    ALKPHOS -- --  BILITOT -- --  PROT -- --  ALBUMIN 3.1* 3.1*    TELE: SR with PACs and PVCs   Radiology/Studies:  Dg Chest Port 1 View  08/26/2011  *RADIOLOGY REPORT*  Clinical Data: Shortness of breath.  PORTABLE CHEST - 1 VIEW  Comparison: 08/21/2011  Findings: The right effusion has reaccumulated since the prior study.  The heart size and vascularity remain normal.  Left lung is clear.  IMPRESSION: Interval increase in the moderate right pleural effusion.  Original Report Authenticated By: Warren [name], M.D.    Current Medications:     . amLODipine  5 mg Oral Daily  . chlorhexidine  15 mL Mouth/Throat BID  . feeding supplement  1 Container Oral TID WC  . furosemide  160 mg Intravenous Q8H  . hydrALAZINE  50 mg Oral Q8H  . labetalol  200 mg Oral BID  . neomycin-bacitracin-polymyxin   Topical QID  . sertraline  150 mg Oral Daily  . DISCONTD: amLODipine  10 mg Oral Daily    ASSESSMENT AND PLAN: Patient Active Problem List  Diagnoses  . HYPERTENSION - good control on current Rx  . AORTIC DISSECTION - for surgery in am  .  THORACIC AORTIC ANEURYSM, DISSECTING -  for surgery in am  . RENAL FAILURE, ACUTE - renal following, improving  . RENAL INSUFFICIENCY, CHRONIC - renal following  . Aortic regurgitation - for surgery in am  . Acute on chronic diastolic heart failure - high-dose Lasix per renal team  . Mitral regurgitation - for surgery in am   S/p dental extraction - per Dr Warren [name] office, OK to advance diet  . Anemia - follow    Signed, Warren [name] , PA-C  History reviewed with the patient, no changes to be made.  The patient exam reveals no crackles, murmur unchanged.  All available labs, radiology testing, previous records reviewed. Agree with documented assessment and plan Surgery tomorrow.  Given Lasix today by Dr. Allena [name].  I will discuss this with him. I agree that regardless of how good his renal function is before surgery that he is likely to have renal  insufficiency/failure afterward. Warren [name]  1:59 PM 06/28/2011

## 2011-08-27 NOTE — Progress Notes (Signed)
Subjective: Interval History: Patient had SOB and increased O2 requirement overnight. Repeat CXR revealed worsening R pleural effusion. He was given lasix 40 mg IV x 1. No complaints except he wants a regular diet. Will be NPO after MN for valve replacement surgery tomorrow. Net negative 650 ml with 1610 ml of UO with lasix 40 IV x 1.   Objective:  Vital signs in last 24 hours:  Temp:  [97.4 F (36.3 C)-97.7 F (36.5 C)] 97.7 F (36.5 C) (01/15 0300) Pulse Rate:  [70-84] 84  (01/15 0300) Resp:  [20-22] 20  (01/15 0300) BP: (98-137)/(42-49) 124/49 mmHg (01/15 0629) SpO2:  [91 %-96 %] 91 % (01/15 0300) Weight:  [181 lb 11.2 oz (82.419 kg)] 181 lb 11.2 oz (82.419 kg) (01/15 0300) Weight change: -1 lb 8 oz (-0.682 kg)  Intake/Output: I/O last 3 completed shifts: In: 1800 [P.O.:1200; I.V.:600] Out: 1935 [Urine:1935]  Intake/Output this shift:     MEDS:     . amLODipine  5 mg Oral Daily  . chlorhexidine  15 mL Mouth/Throat BID  . feeding supplement  1 Container Oral TID WC  . furosemide  160 mg Intravenous Q8H  . furosemide  40 mg Intravenous Once  . hydrALAZINE  50 mg Oral Q8H  . labetalol  200 mg Oral BID  . neomycin-bacitracin-polymyxin   Topical QID  . sertraline  150 mg Oral Daily  . DISCONTD: amLODipine  10 mg Oral Daily      . sodium chloride 50 mL/hr (08/26/11 1951)  ALPRAZolam, ALPRAZolam, fentaNYL, HYDROcodone-acetaminophen, midazolam, ondansetron, oxyCODONE-acetaminophen, traZODone, zolpidem  EXAM: Long haired gentleman; bruising of the face from tooth extractions CVS- normal rhythm, no rub; loud murmur of AI at USB and 1/6 mitral murmur at the apex  RS- normal WOB, scattered crackles b/l base R>L,  no rales ABD-NABS, soft, NT, no masses  EXT- no edema    Lab Results: No results found for this basename: WBC:3,HGB:3,HCT:3,PLT:3 in the last 72 hours BMET  Basename 08/27/11 0515 08/26/11 0644 08/25/11 1100 08/25/11 0700  NA 138 136 136 --  K 3.6 4.2 4.3 --    CL 106 106 105 --  CO2 19 16* 20 --  GLUCOSE 88 93 97 --  BUN 51* 60* 67* --  CREATININE 2.21* 2.76* 3.24* --  CALCIUM 8.4 8.9 9.1 --  PHOS 3.2 3.5 -- 3.6   LFT  Basename 08/27/11 0515  PROT --  ALBUMIN 3.1*  AST --  ALT --  ALKPHOS --  BILITOT --  BILIDIR --  IBILI --   PT/INR No results found for this basename: LABPROT:2,INR:2 in the last 72 hours Hepatitis Panel No results found for this basename: HEPBSAG,HCVAB,HEPAIGM,HEPBIGM in the last 72 hours PTH: Lab Results  Component Value Date   PTH 61.2 08/21/2011   CALCIUM 8.9 08/26/2011   CAION 1.11* 10/24/2008   PHOS 3.5 08/26/2011    Studies/Results: Dg Chest Port 1 View  08/26/2011  *RADIOLOGY REPORT*  Clinical Data: Shortness of breath.  PORTABLE CHEST - 1 VIEW  Comparison: 08/21/2011  Findings: The right effusion has reaccumulated since the prior study.  The heart size and vascularity remain normal.  Left lung is clear.  IMPRESSION: Interval increase in the moderate right pleural effusion.  Original Report Authenticated By: Gwynn Burly, M.D.    Assessment/Plan: 66 yo M with known CKD stage 3/4 developed acute kidney injury while in hospital marked by elevated Cr and oliguria, with only identifiable insults possible volume depletion with some hypotension at  time of dental extractions.   1. Acute on chronic kidney injury: Improving. Cr down to 2.21 today. Baseline Cr 2-2.5 (see initial consult note for trends). 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 Etiology of AKI also not entirely clear. Suspected acute kidney injury from relative hypotension during mouth surgery and volume depletion. FeNa was consistent with prerenal azotemia. Plan for today is high dose IV Lasix 160 mg IV q 8 x 3 doses for fluid removal. I am most concerned that he will experience significant recurrent AKI perioperatively as it did not take a very big hit to see the current worsening of  renal function. Patient is willing to initiate dialysis if it is suggested. Valve surgery on hold pending improvement in renal function as surgery at this time carries a high risk of worsening CKD. Proceed with valve surgery but place permcath at time of surgery and save an arm for permanent vascular access. 2. 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 Lasix as per above.  3.  HTN: BP elevated this AM on amlodipine. Anticipate better control with additional lasix.    LOS: 25 FUNCHES,JOSALYN 08/25/1210:13 PM  I have seen and examined this patient and agree with the assessment/plan as outlined above by Funches MD (PGY2). Renal function continues to show improvement with fair urine output, challenge with furosemide earlier today due to his shortness of breath overnight. 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 Plans noted by CTS to defer placement of tunneled dialysis catheter in tandem with surgery-this will be done postoperatively as indicated. Labs at this time appear to be within fair limits. We'll discontinue furosemide after 2 more doses. Benna Arno K.,MD 08/27/2011 12:52 PM

## 2011-08-27 NOTE — Progress Notes (Signed)
8 Days Post-Op Procedure(s) (LRB): MULTIPLE EXTRACION WITH ALVEOLOPLASTY (N/A) Subjective: Felt short of breath this AM, better now Had a loose BM earlier today  Objective: Vital signs in last 24 hours: Temp:  [97.4 F (36.3 C)-97.7 F (36.5 C)] 97.7 F (36.5 C) (01/15 0300) Pulse Rate:  [70-84] 82  (01/15 1009) Cardiac Rhythm:  [-] Normal sinus rhythm (01/15 0850) Resp:  [20] 20  (01/15 0300) BP: (124-154)/(42-57) 154/57 mmHg (01/15 1009) SpO2:  [91 %-96 %] 91 % (01/15 0300) Weight:  [82.419 kg (181 lb 11.2 oz)] 82.419 kg (181 lb 11.2 oz) (01/15 0300)  Hemodynamic parameters for last 24 hours:    Intake/Output from previous day: 01/14 0701 - 01/15 0700 In: 960 [P.O.:960] Out: 1610 [Urine:1610] Intake/Output this shift: Total I/O In: 410 [P.O.:210; I.V.:200] Out: 500 [Urine:500]  unchanged  Lab Results: No results found for this basename: WBC:2,HGB:2,HCT:2,PLT:2 in the last 72 hours BMET:  [facility] 08/27/11 0515 08/26/11 0644  NA 138 136  K 3.6 4.2  CL 106 106  CO2 19 16*  GLUCOSE 88 93  BUN 51* 60*  CREATININE 2.21* 2.76*  CALCIUM 8.4 8.9    PT/INR: No results found for this basename: LABPROT,INR in the last 72 hours ABG    Component Value Date/Time   PHART 7.508* 10/22/2008 1345   HCO3 20.0 10/22/2008 1345   TCO2 28 10/24/2008 1805   ACIDBASEDEF 2.0 10/22/2008 1345   O2SAT 99.0 10/22/2008 1345   CBG (last 3)  No results found for this basename: GLUCAP:3 in the last 72 hours  Assessment/Plan: S/P Procedure(s) (LRB): MULTIPLE EXTRACION WITH ALVEOLOPLASTY (N/A) For Redo sternotomy, AVR, MV repair/ replacement in AM  Renal function at baseline  He's aware of the indications, risks, benefits and alternatives. He understands this a long, complex operation with high risk for perioperative morbidity and mortality. He understands risks include but are not limited to death, stroke, MI, DVT/PE, bleeding, need for transfusion, infection, other organ system  dysfunction, including respiratory, renal or GI failure. I don't think he is a candidate for a mechanical valve due to his social situation, therefore will plan a pericardial valve.  Given the length and complexity of the cardiac operation, will NOT try to place permcath at same time.   LOS: 25 days    HENDRICKSON,STEVEN C 08/27/2011

## 2011-08-28 ENCOUNTER — Other Ambulatory Visit: Payer: Self-pay | Admitting: Thoracic Surgery (Cardiothoracic Vascular Surgery)

## 2011-08-28 ENCOUNTER — Encounter (HOSPITAL_COMMUNITY)
Admission: AD | Disposition: A | Discharge status: Home Health | Payer: Self-pay | Source: Other Acute Inpatient Hospital | Attending: Internal Medicine

## 2011-08-28 ENCOUNTER — Encounter (HOSPITAL_COMMUNITY): Payer: Self-pay | Admitting: Anesthesiology

## 2011-08-28 ENCOUNTER — Inpatient Hospital Stay (HOSPITAL_COMMUNITY): Payer: Medicare Other | Admitting: Anesthesiology

## 2011-08-28 ENCOUNTER — Other Ambulatory Visit: Payer: Self-pay

## 2011-08-28 ENCOUNTER — Inpatient Hospital Stay (HOSPITAL_COMMUNITY): Payer: Medicare Other

## 2011-08-28 DIAGNOSIS — I059 Rheumatic mitral valve disease, unspecified: Secondary | ICD-10-CM

## 2011-08-28 DIAGNOSIS — I359 Nonrheumatic aortic valve disorder, unspecified: Secondary | ICD-10-CM

## 2011-08-28 HISTORY — PX: AORTIC VALVE REPLACEMENT: SHX41

## 2011-08-28 HISTORY — PX: MITRAL VALVE REPAIR: SHX2039

## 2011-08-28 LAB — POCT I-STAT 4, (NA,K, GLUC, HGB,HCT)
Glucose, Bld: 97 mg/dL (ref 70–99)
Glucose, Bld: 72 mg/dL (ref 70–99)
Glucose, Bld: 156 mg/dL — ABNORMAL HIGH (ref 70–99)
Glucose, Bld: 121 mg/dL — ABNORMAL HIGH (ref 70–99)
HCT: 30 % — ABNORMAL LOW (ref 39.0–52.0)
HCT: 23 % — ABNORMAL LOW (ref 39.0–52.0)
HCT: 21 % — ABNORMAL LOW (ref 39.0–52.0)
HCT: 26 % — ABNORMAL LOW (ref 39.0–52.0)
Hemoglobin: 10.2 g/dL — ABNORMAL LOW (ref 13.0–17.0)
Hemoglobin: 7.8 g/dL — ABNORMAL LOW (ref 13.0–17.0)
Hemoglobin: 9.5 g/dL — ABNORMAL LOW (ref 13.0–17.0)
Hemoglobin: 7.1 g/dL — ABNORMAL LOW (ref 13.0–17.0)
Potassium: 3.3 mEq/L — ABNORMAL LOW (ref 3.5–5.1)
Potassium: 3.1 mEq/L — ABNORMAL LOW (ref 3.5–5.1)
Potassium: 4 mEq/L (ref 3.5–5.1)
Potassium: 3.9 mEq/L (ref 3.5–5.1)
Sodium: 143 mEq/L (ref 135–145)
Sodium: 138 mEq/L (ref 135–145)
Sodium: 144 mEq/L (ref 135–145)

## 2011-08-28 LAB — POCT I-STAT 3, ART BLOOD GAS (G3+)
Acid-base deficit: 10 mmol/L — ABNORMAL HIGH (ref 0.0–2.0)
Acid-base deficit: 5 mmol/L — ABNORMAL HIGH (ref 0.0–2.0)
Bicarbonate: 22.5 mEq/L (ref 20.0–24.0)
Bicarbonate: 17.9 mEq/L — ABNORMAL LOW (ref 20.0–24.0)
Bicarbonate: 19.7 mEq/L — ABNORMAL LOW (ref 20.0–24.0)
Patient temperature: 34.8
TCO2: 24 mmol/L (ref 0–100)
TCO2: 19 mmol/L (ref 0–100)
pCO2 arterial: 42.7 mmHg (ref 35.0–45.0)
pCO2 arterial: 32.5 mmHg — ABNORMAL LOW (ref 35.0–45.0)
pH, Arterial: 7.331 — ABNORMAL LOW (ref 7.350–7.450)
pH, Arterial: 7.243 — ABNORMAL LOW (ref 7.350–7.450)
pH, Arterial: 7.386 (ref 7.350–7.450)
pO2, Arterial: 399 mmHg — ABNORMAL HIGH (ref 80.0–100.0)
pO2, Arterial: 127 mmHg — ABNORMAL HIGH (ref 80.0–100.0)

## 2011-08-28 LAB — GLUCOSE, CAPILLARY
Glucose-Capillary: 119 mg/dL — ABNORMAL HIGH (ref 70–99)
Glucose-Capillary: 108 mg/dL — ABNORMAL HIGH (ref 70–99)

## 2011-08-28 LAB — CBC
HCT: 29.8 % — ABNORMAL LOW (ref 39.0–52.0)
Hemoglobin: 9.9 g/dL — ABNORMAL LOW (ref 13.0–17.0)
MCHC: 32 g/dL (ref 30.0–36.0)
MCHC: 33.2 g/dL (ref 30.0–36.0)
MCV: 77.2 fL — ABNORMAL LOW (ref 78.0–100.0)
MCV: 77.1 fL — ABNORMAL LOW (ref 78.0–100.0)
Platelets: 225 10*3/uL (ref 150–400)
Platelets: 209 10*3/uL (ref 150–400)
RBC: 4.34 MIL/uL (ref 4.22–5.81)
RDW: 16.9 % — ABNORMAL HIGH (ref 11.5–15.5)
RDW: 16.9 % — ABNORMAL HIGH (ref 11.5–15.5)
WBC: 17 10*3/uL — ABNORMAL HIGH (ref 4.0–10.5)
WBC: 6.3 10*3/uL (ref 4.0–10.5)
WBC: 7.4 10*3/uL (ref 4.0–10.5)

## 2011-08-28 LAB — PREPARE PLATELET PHERESIS: Unit division: 0

## 2011-08-28 LAB — PREPARE FRESH FROZEN PLASMA
Unit division: 0
Unit division: 0
Unit division: 0

## 2011-08-28 LAB — PREPARE RBC (CROSSMATCH)

## 2011-08-28 LAB — HEMOGLOBIN AND HEMATOCRIT, BLOOD
HCT: 21.4 % — ABNORMAL LOW (ref 39.0–52.0)
Hemoglobin: 6.8 g/dL — CL (ref 13.0–17.0)

## 2011-08-28 LAB — CARBOXYHEMOGLOBIN: Methemoglobin: 0.6 % (ref 0.0–1.5)

## 2011-08-28 LAB — RENAL FUNCTION PANEL
CO2: 23 mEq/L (ref 19–32)
Chloride: 107 mEq/L (ref 96–112)
GFR calc Af Amer: 42 mL/min — ABNORMAL LOW (ref >90)
Glucose, Bld: 92 mg/dL (ref 70–99)
Potassium: 3.9 mEq/L (ref 3.5–5.1)
Sodium: 139 mEq/L (ref 135–145)

## 2011-08-28 LAB — HEMOGLOBIN A1C
Hgb A1c MFr Bld: 5.8 % — ABNORMAL HIGH (ref <5.7)
Mean Plasma Glucose: 120 mg/dL — ABNORMAL HIGH (ref <117)

## 2011-08-28 LAB — SURGICAL PCR SCREEN
MRSA, PCR: NEGATIVE
Staphylococcus aureus: NEGATIVE

## 2011-08-28 LAB — POCT I-STAT GLUCOSE: Operator id: 190201

## 2011-08-28 LAB — PLATELET COUNT: Platelets: 110 10*3/uL — ABNORMAL LOW (ref 150–400)

## 2011-08-28 SURGERY — REPLACEMENT, AORTIC VALVE, OPEN
Anesthesia: General | Site: Chest | Wound class: Clean

## 2011-08-28 MED ORDER — SODIUM CHLORIDE 0.9 % IV SOLN
5.0000 g | Freq: Once | INTRAVENOUS | Status: DC
Start: 2011-08-28 — End: 2011-08-28
  Filled 2011-08-28: qty 20

## 2011-08-28 MED ORDER — ONDANSETRON HCL 4 MG/2ML IJ SOLN: 4.0000 mg | Freq: Four times a day (QID) | INTRAMUSCULAR | Status: DC | PRN

## 2011-08-28 MED ORDER — CALCIUM CHLORIDE 10 % IV SOLN
1.0000 g | Freq: Once | INTRAVENOUS | Status: AC
  Administered 2011-08-28: 1 g via INTRAVENOUS

## 2011-08-28 MED ORDER — PHENYLEPHRINE HCL 10 MG/ML IJ SOLN
20.0000 mg | INTRAVENOUS | Status: DC | PRN
  Administered 2011-08-28: 40 ug/min via INTRAVENOUS

## 2011-08-28 MED ORDER — DOPAMINE-DEXTROSE 3.2-5 MG/ML-% IV SOLN
INTRAVENOUS | Status: DC | PRN
  Administered 2011-08-28: 5 ug/kg/min via INTRAVENOUS

## 2011-08-28 MED ORDER — HEPARIN SODIUM (PORCINE) 1000 UNIT/ML IJ SOLN
INTRAMUSCULAR | Status: DC | PRN
  Administered 2011-08-28: 5000 [IU] via INTRAVENOUS
  Administered 2011-08-28: 23000 [IU] via INTRAVENOUS

## 2011-08-28 MED ORDER — SODIUM CHLORIDE 0.9 % IV SOLN
0.4000 ug/kg/h | INTRAVENOUS | Status: DC
  Filled 2011-08-28: qty 4

## 2011-08-28 MED ORDER — SODIUM CHLORIDE 0.9 % IV SOLN
INTRAVENOUS | Status: DC
  Filled 2011-08-28: qty 1

## 2011-08-28 MED ORDER — HEMOSTATIC AGENTS (NO CHARGE) OPTIME
TOPICAL | Status: DC | PRN
  Administered 2011-08-28 (×2): 1 via TOPICAL

## 2011-08-28 MED ORDER — VECURONIUM BROMIDE 10 MG IV SOLR
INTRAVENOUS | Status: DC | PRN
  Administered 2011-08-28 (×3): 5 mg via INTRAVENOUS
  Administered 2011-08-28: 6 mg via INTRAVENOUS
  Administered 2011-08-28: 4 mg via INTRAVENOUS
  Administered 2011-08-28 (×2): 5 mg via INTRAVENOUS

## 2011-08-28 MED ORDER — OXYCODONE HCL 5 MG PO TABS: 5.0000 mg | ORAL_TABLET | ORAL | Status: DC | PRN

## 2011-08-28 MED ORDER — NITROGLYCERIN IN D5W 200-5 MCG/ML-% IV SOLN: 0.0000 ug/min | INTRAVENOUS | Status: DC

## 2011-08-28 MED ORDER — ALBUMIN HUMAN 5 % IV SOLN
INTRAVENOUS | Status: DC | PRN
  Administered 2011-08-28 (×2): via INTRAVENOUS

## 2011-08-28 MED ORDER — METOPROLOL TARTRATE 1 MG/ML IV SOLN: 2.5000 mg | INTRAVENOUS | Status: DC | PRN

## 2011-08-28 MED ORDER — DOCUSATE SODIUM 100 MG PO CAPS: 200.0000 mg | ORAL_CAPSULE | Freq: Every day | ORAL | Status: DC

## 2011-08-28 MED ORDER — SODIUM BICARBONATE 8.4 % IV SOLN
150.0000 meq | Freq: Once | INTRAVENOUS | Status: AC
  Administered 2011-08-28: 150 meq via INTRAVENOUS

## 2011-08-28 MED ORDER — ACETAMINOPHEN 160 MG/5ML PO SOLN
975.0000 mg | Freq: Four times a day (QID) | ORAL | Status: DC
  Administered 2011-08-28: 650 mg
  Administered 2011-08-29: 975 mg
  Filled 2011-08-28: qty 40.6
  Filled 2011-08-28: qty 20.3

## 2011-08-28 MED ORDER — MORPHINE SULFATE 4 MG/ML IJ SOLN
2.0000 mg | INTRAMUSCULAR | Status: DC | PRN
  Administered 2011-08-29: 2 mg via INTRAVENOUS
  Administered 2011-08-29: 4 mg via INTRAVENOUS
  Filled 2011-08-28: qty 1

## 2011-08-28 MED ORDER — DOPAMINE-DEXTROSE 3.2-5 MG/ML-% IV SOLN: 2.0000 ug/kg/min | INTRAVENOUS | Status: DC

## 2011-08-28 MED ORDER — LACTATED RINGERS IV SOLN: 500.0000 mL | Freq: Once | INTRAVENOUS | Status: AC | PRN

## 2011-08-28 MED ORDER — PHENYLEPHRINE HCL 10 MG/ML IJ SOLN
0.0000 ug/min | INTRAVENOUS | Status: DC
  Filled 2011-08-28 (×2): qty 2

## 2011-08-28 MED ORDER — INSULIN ASPART 100 UNIT/ML ~~LOC~~ SOLN
0.0000 [IU] | SUBCUTANEOUS | Status: DC
  Filled 2011-08-28: qty 3

## 2011-08-28 MED ORDER — ALBUMIN HUMAN 5 % IV SOLN
250.0000 mL | INTRAVENOUS | Status: DC | PRN
  Administered 2011-08-28: 250 mL via INTRAVENOUS

## 2011-08-28 MED ORDER — SODIUM CHLORIDE 0.9 % IV SOLN
200.0000 ug | INTRAVENOUS | Status: DC | PRN
  Administered 2011-08-28: 0.2 ug/kg/h via INTRAVENOUS

## 2011-08-28 MED ORDER — DEXTROSE 5 % IV SOLN
1.5000 g | Freq: Two times a day (BID) | INTRAVENOUS | Status: DC
  Administered 2011-08-28: 1.5 g via INTRAVENOUS
  Filled 2011-08-28 (×3): qty 1.5

## 2011-08-28 MED ORDER — PROTAMINE SULFATE 10 MG/ML IV SOLN
INTRAVENOUS | Status: DC | PRN
  Administered 2011-08-28: 28 mg via INTRAVENOUS

## 2011-08-28 MED ORDER — SODIUM CHLORIDE 0.9 % IV SOLN
500.0000 mL | Freq: Once | INTRAVENOUS | Status: AC
  Administered 2011-08-28: 500 mL via INTRAVENOUS

## 2011-08-28 MED ORDER — SODIUM CHLORIDE 0.9 % IV SOLN
10.0000 g | INTRAVENOUS | Status: DC | PRN
  Administered 2011-08-28: 1 g/h via INTRAVENOUS

## 2011-08-28 MED ORDER — ACETAMINOPHEN 500 MG PO TABS
1000.0000 mg | ORAL_TABLET | Freq: Four times a day (QID) | ORAL | Status: DC
  Filled 2011-08-28 (×4): qty 2

## 2011-08-28 MED ORDER — 0.9 % SODIUM CHLORIDE (POUR BTL) OPTIME
TOPICAL | Status: DC | PRN
  Administered 2011-08-28: 2000 mL

## 2011-08-28 MED ORDER — AMINOCAPROIC ACID 250 MG/ML IV SOLN
INTRAVENOUS | Status: DC | PRN
  Administered 2011-08-28: 5 g via INTRAVENOUS

## 2011-08-28 MED ORDER — SODIUM CHLORIDE 0.9 % IV SOLN
0.1000 ug/kg/h | INTRAVENOUS | Status: DC
  Administered 2011-08-29: 0.7 ug/kg/h via INTRAVENOUS
  Filled 2011-08-28 (×4): qty 2

## 2011-08-28 MED ORDER — PROPOFOL 10 MG/ML IV EMUL
INTRAVENOUS | Status: DC | PRN
  Administered 2011-08-28: 50 mg via INTRAVENOUS

## 2011-08-28 MED ORDER — SODIUM CHLORIDE 0.9 % IV SOLN
5.0000 g | Freq: Once | INTRAVENOUS | Status: DC
  Filled 2011-08-28: qty 20

## 2011-08-28 MED ORDER — BISACODYL 10 MG RE SUPP: 10.0000 mg | Freq: Every day | RECTAL | Status: DC

## 2011-08-28 MED ORDER — NITROGLYCERIN IN D5W 200-5 MCG/ML-% IV SOLN
INTRAVENOUS | Status: DC | PRN
  Administered 2011-08-28: 16.6 ug/min via INTRAVENOUS

## 2011-08-28 MED ORDER — POTASSIUM CHLORIDE 10 MEQ/50ML IV SOLN: 10.0000 meq | INTRAVENOUS | Status: AC

## 2011-08-28 MED ORDER — ACETAMINOPHEN 650 MG RE SUPP
650.0000 mg | RECTAL | Status: AC
  Administered 2011-08-28: 650 mg via RECTAL

## 2011-08-28 MED ORDER — MIDAZOLAM HCL 5 MG/5ML IJ SOLN
INTRAMUSCULAR | Status: DC | PRN
  Administered 2011-08-28: 2 mg via INTRAVENOUS
  Administered 2011-08-28: 4 mg via INTRAVENOUS
  Administered 2011-08-28: 2 mg via INTRAVENOUS
  Administered 2011-08-28: 4 mg via INTRAVENOUS

## 2011-08-28 MED ORDER — LACTATED RINGERS IV SOLN: INTRAVENOUS | Status: DC

## 2011-08-28 MED ORDER — SODIUM CHLORIDE 0.9 % IJ SOLN: 3.0000 mL | Freq: Two times a day (BID) | INTRAMUSCULAR | Status: DC

## 2011-08-28 MED ORDER — METOPROLOL TARTRATE 25 MG/10 ML ORAL SUSPENSION
12.5000 mg | Freq: Two times a day (BID) | ORAL | Status: DC
  Filled 2011-08-28 (×3): qty 5

## 2011-08-28 MED ORDER — SODIUM CHLORIDE 0.9 % IJ SOLN
OROMUCOSAL | Status: DC | PRN
  Administered 2011-08-28: 11:00:00 via TOPICAL

## 2011-08-28 MED ORDER — MORPHINE SULFATE 2 MG/ML IJ SOLN
1.0000 mg | INTRAMUSCULAR | Status: DC | PRN
  Administered 2011-08-28 – 2011-08-29 (×4): 2 mg via INTRAVENOUS
  Filled 2011-08-28 (×5): qty 1

## 2011-08-28 MED ORDER — ROCURONIUM BROMIDE 100 MG/10ML IV SOLN
INTRAVENOUS | Status: DC | PRN
  Administered 2011-08-28: 50 mg via INTRAVENOUS

## 2011-08-28 MED ORDER — SODIUM CHLORIDE 0.9 % IV SOLN
100.0000 [IU] | INTRAVENOUS | Status: DC | PRN
  Administered 2011-08-28: 1.1 [IU]/h via INTRAVENOUS

## 2011-08-28 MED ORDER — 0.9 % SODIUM CHLORIDE (POUR BTL) OPTIME
TOPICAL | Status: DC | PRN
  Administered 2011-08-28: 1000 mL

## 2011-08-28 MED ORDER — INSULIN ASPART 100 UNIT/ML ~~LOC~~ SOLN
0.0000 [IU] | SUBCUTANEOUS | Status: DC
  Administered 2011-08-28 – 2011-08-29 (×2): 2 [IU] via SUBCUTANEOUS
  Filled 2011-08-28: qty 3

## 2011-08-28 MED ORDER — BISACODYL 5 MG PO TBEC: 10.0000 mg | DELAYED_RELEASE_TABLET | Freq: Every day | ORAL | Status: DC

## 2011-08-28 MED ORDER — SODIUM CHLORIDE 0.45 % IV SOLN
INTRAVENOUS | Status: DC
  Administered 2011-08-28: 20 mL/h via INTRAVENOUS

## 2011-08-28 MED ORDER — FAMOTIDINE IN NACL 20-0.9 MG/50ML-% IV SOLN
20.0000 mg | Freq: Two times a day (BID) | INTRAVENOUS | Status: DC
  Administered 2011-08-28: 20 mg via INTRAVENOUS

## 2011-08-28 MED ORDER — ASPIRIN EC 325 MG PO TBEC
325.0000 mg | DELAYED_RELEASE_TABLET | Freq: Every day | ORAL | Status: DC
  Filled 2011-08-28: qty 1

## 2011-08-28 MED ORDER — SODIUM CHLORIDE 0.9 % IV SOLN: 250.0000 mL | INTRAVENOUS | Status: DC

## 2011-08-28 MED ORDER — ACETAMINOPHEN 160 MG/5ML PO SOLN: 650.0000 mg | ORAL | Status: AC

## 2011-08-28 MED ORDER — 0.9 % SODIUM CHLORIDE (POUR BTL) OPTIME
TOPICAL | Status: DC | PRN
  Administered 2011-08-28: 5000 mL

## 2011-08-28 MED ORDER — SODIUM BICARBONATE 8.4 % IV SOLN
INTRAVENOUS | Status: AC
  Administered 2011-08-28: 150 meq via INTRAVENOUS
  Filled 2011-08-28: qty 50

## 2011-08-28 MED ORDER — SODIUM CHLORIDE 0.9 % IJ SOLN: 3.0000 mL | INTRAMUSCULAR | Status: DC | PRN

## 2011-08-28 MED ORDER — ASPIRIN 81 MG PO CHEW: 324.0000 mg | CHEWABLE_TABLET | Freq: Every day | ORAL | Status: DC

## 2011-08-28 MED ORDER — METOPROLOL TARTRATE 12.5 MG HALF TABLET
12.5000 mg | ORAL_TABLET | Freq: Two times a day (BID) | ORAL | Status: DC
  Filled 2011-08-28 (×3): qty 1

## 2011-08-28 MED ORDER — SODIUM CHLORIDE 0.9 % IV SOLN
INTRAVENOUS | Status: DC | PRN
  Administered 2011-08-28: 08:00:00 via INTRAVENOUS

## 2011-08-28 MED ORDER — MIDAZOLAM HCL 2 MG/2ML IJ SOLN
2.0000 mg | INTRAMUSCULAR | Status: DC | PRN
  Administered 2011-08-28 – 2011-08-29 (×5): 2 mg via INTRAVENOUS
  Filled 2011-08-28 (×5): qty 2

## 2011-08-28 MED ORDER — INSULIN REGULAR BOLUS VIA INFUSION
0.0000 [IU] | Freq: Three times a day (TID) | INTRAVENOUS | Status: DC
  Filled 2011-08-28 (×5): qty 10

## 2011-08-28 MED ORDER — SODIUM CHLORIDE 0.9 % IV SOLN
INTRAVENOUS | Status: DC
  Administered 2011-08-28: 20 mL/h via INTRAVENOUS

## 2011-08-28 MED ORDER — FENTANYL CITRATE 0.05 MG/ML IJ SOLN
INTRAMUSCULAR | Status: DC | PRN
  Administered 2011-08-28: 100 ug via INTRAVENOUS
  Administered 2011-08-28: 50 ug via INTRAVENOUS
  Administered 2011-08-28: 900 ug via INTRAVENOUS
  Administered 2011-08-28 (×2): 50 ug via INTRAVENOUS
  Administered 2011-08-28: 100 ug via INTRAVENOUS
  Administered 2011-08-28: 250 ug via INTRAVENOUS

## 2011-08-28 MED ORDER — MAGNESIUM SULFATE 40 MG/ML IJ SOLN: 4.0000 g | Freq: Once | INTRAMUSCULAR | Status: DC

## 2011-08-28 MED ORDER — PANTOPRAZOLE SODIUM 40 MG PO TBEC: 40.0000 mg | DELAYED_RELEASE_TABLET | Freq: Every day | ORAL | Status: DC

## 2011-08-28 MED ORDER — SODIUM CHLORIDE 0.9 % IV SOLN
1000.0000 mg | Freq: Once | INTRAVENOUS | Status: AC
  Administered 2011-08-29: 1000 mg via INTRAVENOUS
  Filled 2011-08-28: qty 1000

## 2011-08-28 SURGICAL SUPPLY — 108 items
ADAPTER CARDIO PERF ANTE/RETRO (ADAPTER) ×2 IMPLANT
APPLICATOR COTTON TIP 6IN STRL (MISCELLANEOUS) IMPLANT
ATTRACTOMAT 16X20 MAGNETIC DRP (DRAPES) ×2 IMPLANT
BAG DECANTER FOR FLEXI CONT (MISCELLANEOUS) ×2 IMPLANT
BLADE SAW STERNAL (BLADE) ×2 IMPLANT
BLADE SURG 15 STRL LF DISP TIS (BLADE) ×2 IMPLANT
BLADE SURG 15 STRL SS (BLADE) ×2
CANISTER SUCTION 2500CC (MISCELLANEOUS) ×2 IMPLANT
CANN PRFSN 3/8XRT ANG TPR 14 (MISCELLANEOUS) ×1
CANNULA FEM VENOUS 96880-025 (CANNULA) ×2 IMPLANT
CANNULA GUNDRY RCSP 15FR (MISCELLANEOUS) ×2 IMPLANT
CANNULA OPTISITE PERFUSION 22F (CANNULA) ×2 IMPLANT
CANNULA PRFSN 3/8XRT ANG TPR14 (MISCELLANEOUS) ×1 IMPLANT
CANNULA VEN MTL TIP RT (MISCELLANEOUS) ×1
CANNULA VRC MALB SNGL STG 36FR (MISCELLANEOUS) ×1 IMPLANT
CATH CPB KIT HENDRICKSON (MISCELLANEOUS) ×2 IMPLANT
CATH HEART VENT LEFT (CATHETERS) ×1 IMPLANT
CATH ROBINSON RED A/P 18FR (CATHETERS) ×6 IMPLANT
CATH THORACIC 36FR (CATHETERS) ×2 IMPLANT
CATH THORACIC 36FR RT ANG (CATHETERS) ×8 IMPLANT
CATH/SQUID NICHOLS JEHLE COR (CATHETERS) ×4 IMPLANT
CLIP FOGARTY SPRING 6M (CLIP) IMPLANT
CLOTH BEACON ORANGE TIMEOUT ST (SAFETY) ×2 IMPLANT
CONN 1/2X1/2X1/2  BEN (MISCELLANEOUS) ×1
CONN 1/2X1/2X1/2 BEN (MISCELLANEOUS) ×1 IMPLANT
CONN 3/8X1/2 ST GISH (MISCELLANEOUS) ×2 IMPLANT
CONT SPEC 4OZ CLIKSEAL STRL BL (MISCELLANEOUS) ×4 IMPLANT
COVER SURGICAL LIGHT HANDLE (MISCELLANEOUS) ×4 IMPLANT
CRADLE DONUT ADULT HEAD (MISCELLANEOUS) ×2 IMPLANT
DRAIN CHANNEL 28F RND 3/8 FF (WOUND CARE) ×2 IMPLANT
DRAPE SLUSH MACHINE 52X66 (DRAPES) IMPLANT
DRAPE SLUSH/WARMER DISC (DRAPES) IMPLANT
DRSG COVADERM 4X14 (GAUZE/BANDAGES/DRESSINGS) ×2 IMPLANT
ELECT REM PT RETURN 9FT ADLT (ELECTROSURGICAL) ×4
ELECTRODE REM PT RTRN 9FT ADLT (ELECTROSURGICAL) ×2 IMPLANT
GLOVE BIO SURGEON STRL SZ 6 (GLOVE) ×8 IMPLANT
GLOVE BIO SURGEON STRL SZ 6.5 (GLOVE) ×4 IMPLANT
GLOVE BIO SURGEON STRL SZ8.5 (GLOVE) ×4 IMPLANT
GLOVE BIOGEL PI IND STRL 6 (GLOVE) ×2 IMPLANT
GLOVE BIOGEL PI IND STRL 7.0 (GLOVE) ×4 IMPLANT
GLOVE BIOGEL PI INDICATOR 6 (GLOVE) ×2
GLOVE BIOGEL PI INDICATOR 7.0 (GLOVE) ×4
GLOVE EUDERMIC 7 POWDERFREE (GLOVE) ×6 IMPLANT
GOWN BRE IMP SLV AUR XL STRL (GOWN DISPOSABLE) ×6 IMPLANT
GOWN STRL NON-REIN LRG LVL3 (GOWN DISPOSABLE) ×16 IMPLANT
GUIDEWIRE ANG ZIPWIRE 038X150 (WIRE) ×2 IMPLANT
HEMOSTAT POWDER SURGIFOAM 1G (HEMOSTASIS) ×6 IMPLANT
HEMOSTAT SURGICEL 2X14 (HEMOSTASIS) ×2 IMPLANT
INSERT FOGARTY XLG (MISCELLANEOUS) ×2 IMPLANT
KIT BASIN OR (CUSTOM PROCEDURE TRAY) ×2 IMPLANT
KIT DILATOR VASC 18G NDL (KITS) ×2 IMPLANT
KIT ROOM TURNOVER OR (KITS) ×2 IMPLANT
KIT SUCTION CATH 14FR (SUCTIONS) ×4 IMPLANT
LINE VENT (MISCELLANEOUS) ×2 IMPLANT
LOOP VESSEL SUPERMAXI WHITE (MISCELLANEOUS) ×2 IMPLANT
NEEDLE AORTIC AIR ASPIRATING (NEEDLE) ×2 IMPLANT
NS IRRIG 1000ML POUR BTL (IV SOLUTION) ×14 IMPLANT
PACK OPEN HEART (CUSTOM PROCEDURE TRAY) ×2 IMPLANT
PAD ARMBOARD 7.5X6 YLW CONV (MISCELLANEOUS) ×4 IMPLANT
RING ANLPLS CARP-EDW PHY II 32 (Prosthesis & Implant Heart) ×1 IMPLANT
RING ANNULOPLASTY PHY II (Prosthesis & Implant Heart) ×1 IMPLANT
SEALANT PROGEL (MISCELLANEOUS) ×2 IMPLANT
SET CARDIOPLEGIA MPS 5001102 (MISCELLANEOUS) ×2 IMPLANT
SPONGE GAUZE 4X4 12PLY (GAUZE/BANDAGES/DRESSINGS) ×4 IMPLANT
SPONGE LAP 18X18 X RAY DECT (DISPOSABLE) ×4 IMPLANT
STAPLER VISISTAT 35W (STAPLE) ×4 IMPLANT
STOPCOCK 4 WAY LG BORE MALE ST (IV SETS) ×2 IMPLANT
SUT BONE WAX W31G (SUTURE) ×2 IMPLANT
SUT ETHIBON 2 0 V 52N 30 (SUTURE) ×8 IMPLANT
SUT ETHIBON EXCEL 2-0 V-5 (SUTURE) IMPLANT
SUT ETHIBOND 2 0 SH (SUTURE) ×1
SUT ETHIBOND 2 0 SH 36X2 (SUTURE) ×1 IMPLANT
SUT ETHIBOND 2 0 V4 (SUTURE) ×4 IMPLANT
SUT ETHIBOND 2 0V4 GREEN (SUTURE) ×4 IMPLANT
SUT ETHIBOND 4 0 RB 1 (SUTURE) IMPLANT
SUT ETHIBOND NAB MH 2-0 36IN (SUTURE) ×2 IMPLANT
SUT ETHIBOND V-5 VALVE (SUTURE) IMPLANT
SUT PROLENE 3 0 SH 1 (SUTURE) IMPLANT
SUT PROLENE 3 0 SH DA (SUTURE) ×4 IMPLANT
SUT PROLENE 4 0 RB 1 (SUTURE) ×13
SUT PROLENE 4 0 SH DA (SUTURE) ×4 IMPLANT
SUT PROLENE 4-0 RB1 .5 CRCL 36 (SUTURE) ×13 IMPLANT
SUT PROLENE 6 0 C 1 30 (SUTURE) ×8 IMPLANT
SUT PROLENE 7 0 BV 1 (SUTURE) ×2 IMPLANT
SUT SILK  1 MH (SUTURE) ×3
SUT SILK 1 MH (SUTURE) ×3 IMPLANT
SUT SILK 3 0 SH CR/8 (SUTURE) ×2 IMPLANT
SUT STEEL 6MS V (SUTURE) ×2 IMPLANT
SUT STEEL SZ 6 DBL 3X14 BALL (SUTURE) ×2 IMPLANT
SUT VIC AB 1 CTX 36 (SUTURE) ×2
SUT VIC AB 1 CTX36XBRD ANBCTR (SUTURE) ×2 IMPLANT
SUT VIC AB 2-0 CTX 27 (SUTURE) IMPLANT
SUT VIC AB 3-0 SH 8-18 (SUTURE) ×2 IMPLANT
SUT VIC AB 3-0 X1 27 (SUTURE) IMPLANT
SUTURE E-PAK OPEN HEART (SUTURE) ×2 IMPLANT
SYR 10ML KIT SKIN ADHESIVE (MISCELLANEOUS) IMPLANT
SYSTEM SAHARA CHEST DRAIN ATS (WOUND CARE) ×2 IMPLANT
TOWEL OR 17X24 6PK STRL BLUE (TOWEL DISPOSABLE) ×2 IMPLANT
TOWEL OR 17X26 10 PK STRL BLUE (TOWEL DISPOSABLE) ×4 IMPLANT
TRAY FOLEY IC TEMP SENS 14FR (CATHETERS) ×2 IMPLANT
TUBE CONNECTING 12X1/4 (SUCTIONS) ×2 IMPLANT
TUBE FEEDING 8FR 16IN STR KANG (MISCELLANEOUS) ×2 IMPLANT
UNDERPAD 30X30 INCONTINENT (UNDERPADS AND DIAPERS) ×2 IMPLANT
VALVE AORTIC MAGNA 21MM (Prosthesis & Implant Heart) ×2 IMPLANT
VENT LEFT HEART 12002 (CATHETERS) ×2
VRC MALLEABLE SINGLE STG 36FR (MISCELLANEOUS) ×2
WATER STERILE IRR 1000ML POUR (IV SOLUTION) ×4 IMPLANT
YANKAUER SUCT BULB TIP NO VENT (SUCTIONS) ×4 IMPLANT

## 2011-08-28 NOTE — Brief Op Note (Addendum)
                   [address]            [place] [address]          [phone]    08/02/2011 - 08/28/2011  3:30 PM  PATIENT:  [name]  66 y.o. male  PRE-OPERATIVE DIAGNOSIS:  Aortic insufficiency; mitral regurgitation  POST-OPERATIVE DIAGNOSIS:  Aortic insufficiency; mitral regurgitation  PROCEDURE:  Procedure(s):Redo sternotomy AORTIC VALVE REPLACEMENT (AVR)#21 Magna Ease pericardial,(Model 3300TFX, Serial # [id number]) MITRAL VALVE (MV) REPAIR #32 Physio II annuloplasty ring (Model 5200, Serial # [id number])  SURGEON:  Surgeon(s): Loreli Slot, MD  PHYSICIAN ASSISTANT: Gershon Crane PA-C  ANESTHESIA:   general  PATIENT CONDITION:  ICU - intubated and hemodynamically stable.  PRE-OPERATIVE WEIGHT: 82kg  COMPLICATIONS: Anemia, thrombocytopenia, coagulopathy  XC 192 min CPB 254 min

## 2011-08-28 NOTE — Progress Notes (Signed)
*  PRELIMINARY RESULTS* Echocardiogram Echocardiogram Transesophageal has been performed.  Glean Salen [facility] 08/28/2011, 10:09 AM

## 2011-08-28 NOTE — Preoperative (Signed)
Beta Blockers   Reason not to administer Beta Blockers:Not Applicable 

## 2011-08-28 NOTE — Progress Notes (Signed)
Subjective: Interval History:  Patient to OR for scheduled valve replacement surgery today. He was given lasix 40 mg IV x 1 for SOB and increased O2 requirement yesterday, with repeat CXR revealed worsening R pleural effusion. Net negative 1535 ml with 750 ml of UO with lasix 40 IV.   Objective:  Vital signs in last 24 hours:  Temp:  [97.6 F (36.4 C)-98 F (36.7 C)] 97.9 F (36.6 C) (01/16 0508) Pulse Rate:  [72-88] 72  (01/16 0508) Resp:  [18-19] 18  (01/16 0508) BP: (135-154)/(45-57) 151/45 mmHg (01/16 0508) SpO2:  [92 %-93 %] 93 % (01/16 0508) Weight change:   Intake/Output: I/O last 3 completed shifts: In: 1525 [P.O.:925; I.V.:600] Out: 3570 [Urine:3570]  Intake/Output this shift:     MEDS:     . aminocaproic acid (AMICAR) for OHS   Intravenous To OR  . amLODipine  5 mg Oral Daily  . bisacodyl  5 mg Oral Once  . cefUROXime (ZINACEF)  IV  1.5 g Intravenous To OR  . cefUROXime (ZINACEF)  IV  750 mg Intravenous To OR  . chlorhexidine  60 mL Topical Once  . chlorhexidine  60 mL Topical Once  . chlorhexidine  15 mL Mouth/Throat BID  . dexmedetomidine (PRECEDEX) IV infusion for high rates  0.1-0.7 mcg/kg/hr Intravenous To OR  . diazepam  5 mg Oral Once  . DOPamine  2-20 mcg/kg/min Intravenous To OR  . epinephrine  0.5-20 mcg/min Intravenous To OR  . feeding supplement  1 Container Oral TID WC  . hydrALAZINE  50 mg Oral Q8H  . insulin (NOVOLIN-R) infusion   Intravenous To OR  . labetalol  200 mg Oral BID  . magnesium sulfate  40 mEq Other To OR  . metoprolol tartrate  12.5 mg Oral Once  . neomycin-bacitracin-polymyxin   Topical QID  . nitroGLYCERIN  2-200 mcg/min Intravenous To OR  . nitroglycerin/verapamil/heparin solution irrigation for artery spasm   Irrigation To OR  . phenylephrine (NEO-SYNEPHRINE) Adult infusion  30-200 mcg/min Intravenous To OR  . potassium chloride  80 mEq Other To OR  . potassium chloride  40 mEq Oral BID  . sertraline  150 mg Oral Daily  .  vancomycin  1,250 mg Intravenous To OR  . DISCONTD: chlorhexidine  60 mL Topical Once  . DISCONTD: furosemide  160 mg Intravenous Q8H      . sodium chloride 50 mL/hr at 08/27/11 1601  ALPRAZolam, ALPRAZolam, fentaNYL, HYDROcodone-acetaminophen, midazolam, ondansetron, oxyCODONE-acetaminophen, temazepam, traZODone, zolpidem  EXAM: deferred, pt in OR GEN CVS-  RS-s  EXT-    Lab Results:  [facility] 08/28/11 0645  WBC 6.3  HGB 10.6*  HCT 33.5*  PLT 225   BMET  Basename 08/28/11 0645 08/27/11 1503 08/27/11 0515 08/26/11 0644  NA 139 140 138 --  K 3.9 3.8 3.6 --  CL 107 107 106 --  CO2 23 21 19  --  GLUCOSE 92 102* 88 --  BUN 41* 47* 51* --  CREATININE 1.88* 2.07* 2.21* --  CALCIUM 8.7 8.3* 8.4 --  PHOS 2.5 -- 3.2 3.5   LFT  Basename 08/28/11 0645 08/27/11 1503  PROT -- 5.4*  ALBUMIN 3.1* --  AST -- 11  ALT -- 9  ALKPHOS -- 102  BILITOT -- 0.8  BILIDIR -- --  IBILI -- --   PT/INR  Basename 08/27/11 1503  LABPROT 20.3*  INR 1.70*   Hepatitis Panel No results found for this basename: HEPBSAG,HCVAB,HEPAIGM,HEPBIGM in the last 72 hours PTH: Lab Results  Component Value Date   PTH 61.2 08/21/2011   CALCIUM 8.7 08/28/2011   CAION 1.11* 10/24/2008   PHOS 2.5 08/28/2011    Studies/Results: Dg Chest Port 1 View  08/26/2011  *RADIOLOGY REPORT*  Clinical Data: Shortness of breath.  PORTABLE CHEST - 1 VIEW  Comparison: 08/21/2011  Findings: The right effusion has reaccumulated since the prior study.  The heart size and vascularity remain normal.  Left lung is clear.  IMPRESSION: Interval increase in the moderate right pleural effusion.  Original Report Authenticated By: Gwynn Burly, M.D.    Assessment/Plan: 66 yo M with known CKD stage 3/4 developed acute kidney injury while in hospital marked by elevated Cr and oliguria, with only identifiable insults possible volume depletion with some hypotension at time of dental extractions.   1. Acute on chronic kidney  injury: Improved to reported baseline with Cr down to 1.88 today. Baseline Cr 2-2.5 (see initial consult note for trends). 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 Etiology of AKI also not entirely clear. Suspected acute kidney injury from relative hypotension during mouth surgery and volume depletion. FeNa was consistent with prerenal azotemia. Yesterday received high dose IV Lasix 160 mg IVx1 with net negative as above. I am most concerned that he will experience significant recurrent AKI perioperatively as it did not take a very big hit to see the current worsening of renal function. Patient is willing to initiate dialysis if it is suggested.  2. 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 Lasix as per above.  3.  HTN: BP elevated this AM on amlodipine. Anticipate better control with additional lasix. F/u post-operatively as likely to experience wide fluid shifts.   LOS: 26 KONKOL,JILL 08/27/64  7:43 AM  I have seen and examined this patient and agree with the assessment/plan as outlined above by Cristal Ford MD (PGY2). Plans noted for surgery today- high risk for AKI and dialysis need post-op. Danny Yackley K.,MD 1/66/2013 1:51 PM

## 2011-08-28 NOTE — Anesthesia Procedure Notes (Signed)
Procedure Name: Intubation Date/Time: 08/28/2011 8:57 AM Performed by: Malachi Pro Pre-anesthesia Checklist: Patient identified, Emergency Drugs available, Patient being monitored, Suction available and Timeout performed Patient Re-evaluated:Patient Re-evaluated prior to inductionOxygen Delivery Method: Circle System Utilized Preoxygenation: Pre-oxygenation with 100% oxygen Intubation Type: IV induction Ventilation: Mask ventilation without difficulty Laryngoscope Size: Mac and 3 Grade View: Grade II Tube type: Oral Number of attempts: 1 Airway Equipment and Method: stylet Placement Confirmation: ETT inserted through vocal cords under direct vision,  positive ETCO2 and breath sounds checked- equal and bilateral Secured at: 27 cm Tube secured with: Tape Dental Injury: Teeth and Oropharynx as per pre-operative assessment

## 2011-08-28 NOTE — Anesthesia Preprocedure Evaluation (Addendum)
Anesthesia Evaluation  Patient identified by MRN, date of birth, ID band Patient awake    Reviewed: Allergy & Precautions, H&P , NPO status , Patient's Chart, lab work & pertinent test results  Airway Mallampati: II TM Distance: >3 FB Neck ROM: Full    Dental No notable dental hx. (+) Edentulous Upper and Edentulous Lower   Pulmonary neg pulmonary ROS,  clear to auscultation  Pulmonary exam normal       Cardiovascular hypertension, Pt. on medications + angina +CHF + dysrhythmias Atrial Fibrillation + Valvular Problems/Murmurs AI and MR Regular Normal+ Systolic murmurs    Neuro/Psych PSYCHIATRIC DISORDERS Depression CVA Negative Neurological ROS  Negative Psych ROS   GI/Hepatic negative GI ROS, Neg liver ROS,   Endo/Other  Negative Endocrine ROS  Renal/GU negative Renal ROS  Genitourinary negative   Musculoskeletal   Abdominal   Peds  Hematology negative hematology ROS (+)   Anesthesia Other Findings   Reproductive/Obstetrics negative OB ROS                           Anesthesia Physical Anesthesia Plan  ASA: IV  Anesthesia Plan: General   Post-op Pain Management:    Induction: Intravenous  Airway Management Planned: Oral ETT  Additional Equipment: Arterial line, CVP, PA Cath, 3D TEE, TEE and Ultrasound Guidance Line Placement  Intra-op Plan:   Post-operative Plan: Post-operative intubation/ventilation  Informed Consent: I have reviewed the patients History and Physical, chart, labs and discussed the procedure including the risks, benefits and alternatives for the proposed anesthesia with the patient or authorized representative who has indicated his/her understanding and acceptance.     Plan Discussed with: CRNA  Anesthesia Plan Comments:         Anesthesia Quick Evaluation

## 2011-08-28 NOTE — Progress Notes (Signed)
Back from OR with stable hemodynamics, Cardiac output not working,will followmixed venous O2 sats. Not bleeding.

## 2011-08-28 NOTE — Transfer of Care (Signed)
Immediate Anesthesia Transfer of Care Note  Patient: Warren [name]  Procedure(s) Performed:  AORTIC VALVE REPLACEMENT (AVR); REDO MITRAL VALVE (MV) REPAIR - Redo MV repair    Patient Location: PACU and SICU  Anesthesia Type: General  Level of Consciousness: sedated  Airway & Oxygen Therapy: Patient remains intubated per anesthesia plan  Post-op Assessment: Post -op Vital signs reviewed and stable  Post vital signs: Reviewed and stable Filed Vitals:   08/28/11 1805  BP: 97/46  Pulse: 90  Temp: 35.3 C  Resp: 12    Complications: No apparent anesthesia complications

## 2011-08-28 NOTE — Anesthesia Postprocedure Evaluation (Signed)
  Anesthesia Post-op Note  Patient: Warren [name]  Procedure(s) Performed:  AORTIC VALVE REPLACEMENT (AVR); REDO MITRAL VALVE (MV) REPAIR - Redo MV repair    Patient Location: SICU  Anesthesia Type: General  Level of Consciousness: sedated and unresponsive  Airway and Oxygen Therapy: Patient remains intubated per anesthesia plan  Post-op Pain: none  Post-op Assessment: Post-op Vital signs reviewed, Patient's Cardiovascular Status Stable and Respiratory Function Stable  Post-op Vital Signs: Reviewed and stable  Complications: No apparent anesthesia complications

## 2011-08-28 NOTE — Progress Notes (Signed)
Patient discussed at the Long Length of Stay Warren [name] Weeks 08/28/2011  

## 2011-08-29 ENCOUNTER — Inpatient Hospital Stay (HOSPITAL_COMMUNITY): Payer: Medicare Other

## 2011-08-29 ENCOUNTER — Inpatient Hospital Stay (HOSPITAL_COMMUNITY): Payer: Medicare Other | Admitting: Anesthesiology

## 2011-08-29 ENCOUNTER — Encounter (HOSPITAL_COMMUNITY): Payer: Self-pay | Admitting: Anesthesiology

## 2011-08-29 ENCOUNTER — Encounter (HOSPITAL_COMMUNITY)
Admission: AD | Disposition: A | Discharge status: Home Health | Payer: Self-pay | Source: Other Acute Inpatient Hospital | Attending: Internal Medicine

## 2011-08-29 DIAGNOSIS — T82598A Other mechanical complication of other cardiac and vascular devices and implants, initial encounter: Secondary | ICD-10-CM

## 2011-08-29 DIAGNOSIS — J930 Spontaneous tension pneumothorax: Secondary | ICD-10-CM

## 2011-08-29 HISTORY — PX: MEDIASTINAL EXPLORATION: SHX5065

## 2011-08-29 LAB — POCT I-STAT, CHEM 8
BUN: 23 mg/dL (ref 6–23)
Calcium, Ion: 1.13 mmol/L (ref 1.12–1.32)
HCT: 32 % — ABNORMAL LOW (ref 39.0–52.0)
Hemoglobin: 10.9 g/dL — ABNORMAL LOW (ref 13.0–17.0)
Sodium: 151 mEq/L — ABNORMAL HIGH (ref 135–145)
TCO2: 23 mmol/L (ref 0–100)

## 2011-08-29 LAB — CARBOXYHEMOGLOBIN
Carboxyhemoglobin: 2.3 % — ABNORMAL HIGH (ref 0.5–1.5)
Methemoglobin: 0.9 % (ref 0.0–1.5)
Methemoglobin: 1.3 % (ref 0.0–1.5)
Total hemoglobin: 9.9 g/dL — ABNORMAL LOW (ref 13.5–18.0)

## 2011-08-29 LAB — POCT I-STAT 3, ART BLOOD GAS (G3+)
Acid-base deficit: 5 mmol/L — ABNORMAL HIGH (ref 0.0–2.0)
O2 Saturation: 98 %
Patient temperature: 36.1
TCO2: 22 mmol/L (ref 0–100)
pH, Arterial: 7.333 — ABNORMAL LOW (ref 7.350–7.450)

## 2011-08-29 LAB — UIFE/LIGHT CHAINS/TP QN, 24-HR UR
Alpha 2, Urine: DETECTED — AB
Free Kappa Lt Chains,Ur: 7.01 mg/dL — ABNORMAL HIGH (ref 0.14–2.42)
Free Kappa/Lambda Ratio: 10.01 ratio (ref 2.04–10.37)
Gamma Globulin, Urine: DETECTED — AB
Total Protein, Urine: 13.3 mg/dL

## 2011-08-29 LAB — MAGNESIUM: Magnesium: 1.8 mg/dL (ref 1.5–2.5)

## 2011-08-29 LAB — POCT I-STAT 4, (NA,K, GLUC, HGB,HCT): Glucose, Bld: 112 mg/dL — ABNORMAL HIGH (ref 70–99)

## 2011-08-29 LAB — CBC
HCT: 29.9 % — ABNORMAL LOW (ref 39.0–52.0)
HCT: 32.4 % — ABNORMAL LOW (ref 39.0–52.0)
Hemoglobin: 10.1 g/dL — ABNORMAL LOW (ref 13.0–17.0)
MCH: 25.6 pg — ABNORMAL LOW (ref 26.0–34.0)
MCHC: 33.8 g/dL (ref 30.0–36.0)
MCHC: 32.4 g/dL (ref 30.0–36.0)
MCV: 75.9 fL — ABNORMAL LOW (ref 78.0–100.0)
MCV: 77.5 fL — ABNORMAL LOW (ref 78.0–100.0)
RBC: 3.94 MIL/uL — ABNORMAL LOW (ref 4.22–5.81)
RDW: 17.3 % — ABNORMAL HIGH (ref 11.5–15.5)

## 2011-08-29 LAB — GLUCOSE, CAPILLARY
Glucose-Capillary: 113 mg/dL — ABNORMAL HIGH (ref 70–99)
Glucose-Capillary: 110 mg/dL — ABNORMAL HIGH (ref 70–99)
Glucose-Capillary: 114 mg/dL — ABNORMAL HIGH (ref 70–99)
Glucose-Capillary: 113 mg/dL — ABNORMAL HIGH (ref 70–99)
Glucose-Capillary: 114 mg/dL — ABNORMAL HIGH (ref 70–99)

## 2011-08-29 LAB — BASIC METABOLIC PANEL
BUN: 28 mg/dL — ABNORMAL HIGH (ref 6–23)
CO2: 21 mEq/L (ref 19–32)
Calcium: 7.2 mg/dL — ABNORMAL LOW (ref 8.4–10.5)
Creatinine, Ser: 1.35 mg/dL (ref 0.50–1.35)
Glucose, Bld: 123 mg/dL — ABNORMAL HIGH (ref 70–99)

## 2011-08-29 SURGERY — EXPLORATION, MEDIASTINUM
Anesthesia: General | Site: Chest | Wound class: Clean

## 2011-08-29 MED ORDER — PANTOPRAZOLE SODIUM 40 MG PO TBEC
40.0000 mg | DELAYED_RELEASE_TABLET | Freq: Every day | ORAL | Status: DC
  Administered 2011-08-31 – 2011-09-17 (×18): 40 mg via ORAL
  Filled 2011-08-29 (×17): qty 1

## 2011-08-29 MED ORDER — ACETAMINOPHEN 650 MG RE SUPP: 650.0000 mg | RECTAL | Status: DC

## 2011-08-29 MED ORDER — ACETAMINOPHEN 160 MG/5ML PO SOLN
975.0000 mg | Freq: Four times a day (QID) | ORAL | Status: AC
  Filled 2011-08-29: qty 40.6

## 2011-08-29 MED ORDER — METOPROLOL TARTRATE 12.5 MG HALF TABLET
12.5000 mg | ORAL_TABLET | Freq: Two times a day (BID) | ORAL | Status: DC
  Filled 2011-08-29 (×2): qty 1

## 2011-08-29 MED ORDER — MIDAZOLAM HCL 2 MG/2ML IJ SOLN
INTRAMUSCULAR | Status: AC
  Administered 2011-08-29: 4 mg via INTRAVENOUS
  Filled 2011-08-29: qty 4

## 2011-08-29 MED ORDER — DOPAMINE-DEXTROSE 3.2-5 MG/ML-% IV SOLN
2.0000 ug/kg/min | INTRAVENOUS | Status: DC
  Filled 2011-08-29: qty 250

## 2011-08-29 MED ORDER — BISACODYL 10 MG RE SUPP: 10.0000 mg | Freq: Every day | RECTAL | Status: DC

## 2011-08-29 MED ORDER — ROCURONIUM BROMIDE 100 MG/10ML IV SOLN
INTRAVENOUS | Status: DC | PRN
  Administered 2011-08-29: 50 mg via INTRAVENOUS

## 2011-08-29 MED ORDER — METOPROLOL TARTRATE 25 MG/10 ML ORAL SUSPENSION
12.5000 mg | Freq: Two times a day (BID) | ORAL | Status: DC
  Filled 2011-08-29 (×4): qty 5

## 2011-08-29 MED ORDER — PHENYLEPHRINE HCL 10 MG/ML IJ SOLN
0.0000 ug/min | INTRAMUSCULAR | Status: DC
  Filled 2011-08-29: qty 2

## 2011-08-29 MED ORDER — SODIUM CHLORIDE 0.9 % IV SOLN
INTRAVENOUS | Status: DC | PRN
  Administered 2011-08-29 (×2): via INTRAVENOUS

## 2011-08-29 MED ORDER — MORPHINE SULFATE 4 MG/ML IJ SOLN
2.0000 mg | INTRAMUSCULAR | Status: DC | PRN
  Administered 2011-08-29: 2 mg via INTRAVENOUS
  Administered 2011-08-29 – 2011-09-01 (×13): 4 mg via INTRAVENOUS
  Filled 2011-08-29 (×15): qty 1

## 2011-08-29 MED ORDER — LACTATED RINGERS IV SOLN: INTRAVENOUS | Status: DC

## 2011-08-29 MED ORDER — SODIUM CHLORIDE 0.9 % IV SOLN
INTRAVENOUS | Status: DC | PRN
  Administered 2011-08-29: 08:00:00 via INTRAVENOUS

## 2011-08-29 MED ORDER — SODIUM CHLORIDE 0.9 % IV SOLN
INTRAVENOUS | Status: DC
  Filled 2011-08-29: qty 1

## 2011-08-29 MED ORDER — ACETAMINOPHEN 500 MG PO TABS
1000.0000 mg | ORAL_TABLET | Freq: Four times a day (QID) | ORAL | Status: AC
  Administered 2011-08-30 – 2011-09-03 (×15): 1000 mg via ORAL
  Filled 2011-08-29 (×16): qty 2

## 2011-08-29 MED ORDER — SODIUM BICARBONATE 8.4 % IV SOLN
50.0000 meq | Freq: Once | INTRAVENOUS | Status: AC
  Filled 2011-08-29: qty 50

## 2011-08-29 MED ORDER — ASPIRIN 81 MG PO CHEW
324.0000 mg | CHEWABLE_TABLET | Freq: Every day | ORAL | Status: DC
  Administered 2011-08-31: 324 mg
  Filled 2011-08-29: qty 1

## 2011-08-29 MED ORDER — MIDAZOLAM HCL 2 MG/2ML IJ SOLN: 2.0000 mg | INTRAMUSCULAR | Status: DC | PRN

## 2011-08-29 MED ORDER — METOPROLOL TARTRATE 1 MG/ML IV SOLN
2.5000 mg | INTRAVENOUS | Status: DC | PRN
  Administered 2011-08-29: 5 mg via INTRAVENOUS
  Administered 2011-09-01: 2.5 mg via INTRAVENOUS
  Filled 2011-08-29 (×2): qty 5

## 2011-08-29 MED ORDER — ALBUMIN HUMAN 5 % IV SOLN: 250.0000 mL | INTRAVENOUS | Status: AC | PRN

## 2011-08-29 MED ORDER — DOCUSATE SODIUM 100 MG PO CAPS
200.0000 mg | ORAL_CAPSULE | Freq: Every day | ORAL | Status: DC
  Administered 2011-08-30 – 2011-09-04 (×6): 200 mg via ORAL
  Filled 2011-08-29 (×6): qty 2

## 2011-08-29 MED ORDER — METOPROLOL TARTRATE 25 MG/10 ML ORAL SUSPENSION
12.5000 mg | Freq: Two times a day (BID) | ORAL | Status: DC
  Filled 2011-08-29 (×2): qty 5

## 2011-08-29 MED ORDER — PROPOFOL 10 MG/ML IV EMUL
INTRAVENOUS | Status: DC | PRN
  Administered 2011-08-29: 100 mg via INTRAVENOUS

## 2011-08-29 MED ORDER — METOPROLOL TARTRATE 12.5 MG HALF TABLET
12.5000 mg | ORAL_TABLET | Freq: Two times a day (BID) | ORAL | Status: DC
  Administered 2011-08-29: 12.5 mg via ORAL
  Filled 2011-08-29 (×4): qty 1

## 2011-08-29 MED ORDER — ACETAMINOPHEN 160 MG/5ML PO SOLN: 975.0000 mg | Freq: Four times a day (QID) | ORAL | Status: DC

## 2011-08-29 MED ORDER — VANCOMYCIN HCL 1000 MG IV SOLR
1000.0000 mg | Freq: Once | INTRAVENOUS | Status: AC
  Administered 2011-08-29: 1000 mg via INTRAVENOUS
  Filled 2011-08-29 (×2): qty 1000

## 2011-08-29 MED ORDER — INSULIN ASPART 100 UNIT/ML ~~LOC~~ SOLN
0.0000 [IU] | SUBCUTANEOUS | Status: DC
  Administered 2011-08-30 – 2011-09-01 (×4): 2 [IU] via SUBCUTANEOUS
  Filled 2011-08-29: qty 3

## 2011-08-29 MED ORDER — ASPIRIN EC 325 MG PO TBEC
325.0000 mg | DELAYED_RELEASE_TABLET | Freq: Every day | ORAL | Status: DC
Start: 2011-08-30 — End: 2011-09-17
  Administered 2011-08-30 – 2011-09-17 (×18): 325 mg via ORAL
  Filled 2011-08-29 (×19): qty 1

## 2011-08-29 MED ORDER — MIDAZOLAM HCL 2 MG/2ML IJ SOLN
4.0000 mg | Freq: Once | INTRAMUSCULAR | Status: AC
  Administered 2011-08-29: 4 mg via INTRAVENOUS

## 2011-08-29 MED ORDER — MORPHINE SULFATE 4 MG/ML IJ SOLN
4.0000 mg | Freq: Once | INTRAMUSCULAR | Status: AC
  Administered 2011-08-29: 4 mg via INTRAVENOUS

## 2011-08-29 MED ORDER — NITROGLYCERIN IN D5W 200-5 MCG/ML-% IV SOLN: 0.0000 ug/min | INTRAVENOUS | Status: DC

## 2011-08-29 MED ORDER — SODIUM CHLORIDE 0.9 % IJ SOLN
3.0000 mL | INTRAMUSCULAR | Status: DC | PRN
  Administered 2011-09-01: 10 mL via INTRAVENOUS
  Administered 2011-09-04: 3 mL via INTRAVENOUS

## 2011-08-29 MED ORDER — SODIUM CHLORIDE 0.9 % IV SOLN
250.0000 mL | INTRAVENOUS | Status: DC
  Administered 2011-08-30 – 2011-09-04 (×3): 250 mL via INTRAVENOUS

## 2011-08-29 MED ORDER — DEXTROSE 5 % IV SOLN
1.5000 g | Freq: Two times a day (BID) | INTRAVENOUS | Status: AC
  Administered 2011-08-29 – 2011-08-30 (×4): 1.5 g via INTRAVENOUS
  Filled 2011-08-29 (×4): qty 1.5

## 2011-08-29 MED ORDER — SODIUM CHLORIDE 0.9 % IR SOLN
Status: DC | PRN
  Administered 2011-08-29: 3000 mL

## 2011-08-29 MED ORDER — MIDAZOLAM HCL 2 MG/2ML IJ SOLN
INTRAMUSCULAR | Status: AC
  Filled 2011-08-29: qty 4

## 2011-08-29 MED ORDER — SODIUM CHLORIDE 0.9 % IJ SOLN
3.0000 mL | Freq: Two times a day (BID) | INTRAMUSCULAR | Status: DC
  Administered 2011-08-30 – 2011-09-04 (×11): 3 mL via INTRAVENOUS

## 2011-08-29 MED ORDER — SODIUM BICARBONATE 8.4 % IV SOLN
INTRAVENOUS | Status: AC
  Filled 2011-08-29: qty 50

## 2011-08-29 MED ORDER — ONDANSETRON HCL 4 MG/2ML IJ SOLN
4.0000 mg | Freq: Four times a day (QID) | INTRAMUSCULAR | Status: DC | PRN
  Administered 2011-09-01 – 2011-09-15 (×4): 4 mg via INTRAVENOUS
  Filled 2011-08-29 (×5): qty 2

## 2011-08-29 MED ORDER — INSULIN REGULAR BOLUS VIA INFUSION
0.0000 [IU] | Freq: Three times a day (TID) | INTRAVENOUS | Status: DC
  Filled 2011-08-29 (×3): qty 10

## 2011-08-29 MED ORDER — BISACODYL 5 MG PO TBEC
10.0000 mg | DELAYED_RELEASE_TABLET | Freq: Every day | ORAL | Status: DC
  Administered 2011-08-30 – 2011-09-15 (×8): 10 mg via ORAL
  Filled 2011-08-29 (×9): qty 2

## 2011-08-29 MED ORDER — ACETAMINOPHEN 500 MG PO TABS: 1000.0000 mg | ORAL_TABLET | Freq: Four times a day (QID) | ORAL | Status: DC

## 2011-08-29 MED ORDER — POTASSIUM CHLORIDE 10 MEQ/50ML IV SOLN: 10.0000 meq | INTRAVENOUS | Status: DC

## 2011-08-29 MED ORDER — FENTANYL CITRATE 0.05 MG/ML IJ SOLN
INTRAMUSCULAR | Status: DC | PRN
  Administered 2011-08-29: 150 ug via INTRAVENOUS
  Administered 2011-08-29: 100 ug via INTRAVENOUS
  Administered 2011-08-29: 150 ug via INTRAVENOUS
  Administered 2011-08-29: 100 ug via INTRAVENOUS

## 2011-08-29 MED ORDER — SODIUM CHLORIDE 0.9 % IV SOLN
0.1000 ug/kg/h | INTRAVENOUS | Status: DC
  Filled 2011-08-29: qty 2

## 2011-08-29 MED ORDER — MAGNESIUM SULFATE 40 MG/ML IJ SOLN: 4.0000 g | Freq: Once | INTRAMUSCULAR | Status: DC

## 2011-08-29 MED ORDER — SODIUM CHLORIDE 0.9 % IV SOLN
INTRAVENOUS | Status: DC | PRN
  Administered 2011-08-29 (×3): via INTRAVENOUS

## 2011-08-29 MED ORDER — MORPHINE SULFATE 2 MG/ML IJ SOLN
1.0000 mg | INTRAMUSCULAR | Status: AC | PRN
  Administered 2011-08-29 (×3): 2 mg via INTRAVENOUS
  Filled 2011-08-29 (×4): qty 1

## 2011-08-29 MED ORDER — LORAZEPAM 2 MG/ML IJ SOLN
1.0000 mg | INTRAMUSCULAR | Status: DC | PRN
  Administered 2011-08-29 – 2011-08-31 (×7): 1 mg via INTRAVENOUS
  Filled 2011-08-29 (×7): qty 1

## 2011-08-29 MED ORDER — OXYCODONE HCL 5 MG PO TABS
5.0000 mg | ORAL_TABLET | ORAL | Status: DC | PRN
  Administered 2011-08-31: 5 mg via ORAL
  Administered 2011-09-01 – 2011-09-03 (×9): 10 mg via ORAL
  Administered 2011-09-03: 5 mg via ORAL
  Administered 2011-09-03 – 2011-09-11 (×35): 10 mg via ORAL
  Administered 2011-09-12: 5 mg via ORAL
  Administered 2011-09-12: 10 mg via ORAL
  Administered 2011-09-12: 5 mg via ORAL
  Administered 2011-09-12 – 2011-09-17 (×17): 10 mg via ORAL
  Filled 2011-08-29 (×8): qty 2
  Filled 2011-08-29: qty 1
  Filled 2011-08-29 (×28): qty 2
  Filled 2011-08-29: qty 1
  Filled 2011-08-29 (×18): qty 2
  Filled 2011-08-29: qty 1
  Filled 2011-08-29 (×11): qty 2

## 2011-08-29 MED ORDER — ACETAMINOPHEN 160 MG/5ML PO SOLN: 650.0000 mg | ORAL | Status: DC

## 2011-08-29 MED ORDER — SODIUM CHLORIDE 0.9 % IV SOLN
INTRAVENOUS | Status: DC
  Administered 2011-09-01: 02:00:00 via INTRAVENOUS

## 2011-08-29 MED ORDER — MIDAZOLAM HCL 5 MG/5ML IJ SOLN
INTRAMUSCULAR | Status: DC | PRN
  Administered 2011-08-29: 2 mg via INTRAVENOUS

## 2011-08-29 MED ORDER — SODIUM CHLORIDE 0.45 % IV SOLN: INTRAVENOUS | Status: DC

## 2011-08-29 MED ORDER — FAMOTIDINE IN NACL 20-0.9 MG/50ML-% IV SOLN: 20.0000 mg | Freq: Two times a day (BID) | INTRAVENOUS | Status: DC

## 2011-08-29 MED ORDER — LACTATED RINGERS IV SOLN: 500.0000 mL | Freq: Once | INTRAVENOUS | Status: AC | PRN

## 2011-08-29 MED FILL — Potassium Chloride Inj 2 mEq/ML: INTRAVENOUS | Qty: 40 | Status: AC

## 2011-08-29 MED FILL — Sodium Bicarbonate IV Soln 8.4%: INTRAVENOUS | Qty: 0.3 | Status: AC

## 2011-08-29 MED FILL — Lactated Ringer's Solution: INTRAVENOUS | Qty: 500 | Status: AC

## 2011-08-29 MED FILL — Magnesium Sulfate Inj 50%: INTRAMUSCULAR | Qty: 10 | Status: AC

## 2011-08-29 MED FILL — Dexmedetomidine HCl IV Soln 200 MCG/2ML: INTRAVENOUS | Qty: 2 | Status: AC

## 2011-08-29 MED FILL — Nitroglycerin IV Soln 5 MG/ML: INTRAVENOUS | Qty: 0.84 | Status: AC

## 2011-08-29 MED FILL — Heparin Sodium (Porcine) Inj 1000 Unit/ML: INTRAMUSCULAR | Qty: 2.5 | Status: AC

## 2011-08-29 MED FILL — Verapamil HCl IV Soln 2.5 MG/ML: INTRAVENOUS | Qty: 4 | Status: AC

## 2011-08-29 SURGICAL SUPPLY — 106 items
ADAPTER CARDIO PERF ANTE/RETRO (ADAPTER) IMPLANT
APPLIER CLIP 9.375 MED OPEN (MISCELLANEOUS)
APPLIER CLIP 9.375 SM OPEN (CLIP)
ATTRACTOMAT 16X20 MAGNETIC DRP (DRAPES) ×3 IMPLANT
BAG DECANTER FOR FLEXI CONT (MISCELLANEOUS) IMPLANT
BANDAGE ELASTIC 4 VELCRO ST LF (GAUZE/BANDAGES/DRESSINGS) IMPLANT
BANDAGE ELASTIC 6 VELCRO ST LF (GAUZE/BANDAGES/DRESSINGS) IMPLANT
BANDAGE GAUZE ELAST BULKY 4 IN (GAUZE/BANDAGES/DRESSINGS) IMPLANT
BASKET HEART (ORDER IN 25'S) (MISCELLANEOUS)
BASKET HEART (ORDER IN 25S) (MISCELLANEOUS) IMPLANT
BLADE SAW STERNAL (BLADE) ×3 IMPLANT
BLADE SURG 11 STRL SS (BLADE) IMPLANT
BLADE SURG ROTATE 9660 (MISCELLANEOUS) IMPLANT
CANISTER SUCTION 2500CC (MISCELLANEOUS) ×6 IMPLANT
CANNULA GUNDRY RCSP 15FR (MISCELLANEOUS) IMPLANT
CATH ROBINSON RED A/P 18FR (CATHETERS) ×3 IMPLANT
CATH THORACIC 36FR RT ANG (CATHETERS) IMPLANT
CLIP APPLIE 9.375 MED OPEN (MISCELLANEOUS) IMPLANT
CLIP APPLIE 9.375 SM OPEN (CLIP) IMPLANT
CLIP FOGARTY SPRING 6M (CLIP) IMPLANT
CLIP RETRACTION 3.0MM CORONARY (MISCELLANEOUS) IMPLANT
CLIP TI MEDIUM 24 (CLIP) IMPLANT
CLIP TI WIDE RED SMALL 24 (CLIP) IMPLANT
CLOTH BEACON ORANGE TIMEOUT ST (SAFETY) ×3 IMPLANT
CONN Y 3/8X3/8X3/8  BEN (MISCELLANEOUS)
CONN Y 3/8X3/8X3/8 BEN (MISCELLANEOUS) IMPLANT
CORONARY SUCKER SOFT TIP 10052 (MISCELLANEOUS) IMPLANT
COVER SURGICAL LIGHT HANDLE (MISCELLANEOUS) ×6 IMPLANT
CRADLE DONUT ADULT HEAD (MISCELLANEOUS) ×3 IMPLANT
DRAPE CARDIOVASCULAR INCISE (DRAPES) ×1
DRAPE SLUSH/WARMER DISC (DRAPES) ×3 IMPLANT
DRAPE SRG 135X102X78XABS (DRAPES) ×2 IMPLANT
DRSG COVADERM 4X14 (GAUZE/BANDAGES/DRESSINGS) ×3 IMPLANT
ELECT CAUTERY BLADE 6.4 (BLADE) IMPLANT
ELECT REM PT RETURN 9FT ADLT (ELECTROSURGICAL) ×6
ELECTRODE REM PT RTRN 9FT ADLT (ELECTROSURGICAL) ×4 IMPLANT
GAUZE XEROFORM 1X8 LF (GAUZE/BANDAGES/DRESSINGS) ×3 IMPLANT
GLOVE BIO SURGEON STRL SZ 6.5 (GLOVE) ×3 IMPLANT
GLOVE BIO SURGEON STRL SZ7.5 (GLOVE) ×3 IMPLANT
GLOVE BIOGEL PI IND STRL 7.0 (GLOVE) ×4 IMPLANT
GLOVE BIOGEL PI IND STRL 7.5 (GLOVE) ×2 IMPLANT
GLOVE BIOGEL PI INDICATOR 7.0 (GLOVE) ×2
GLOVE BIOGEL PI INDICATOR 7.5 (GLOVE) ×1
GLOVE EUDERMIC 7 POWDERFREE (GLOVE) ×6 IMPLANT
GOWN EXTRA PROTECTION XL (GOWNS) ×9 IMPLANT
GOWN STRL NON-REIN LRG LVL3 (GOWN DISPOSABLE) ×12 IMPLANT
HEMOSTAT POWDER SURGIFOAM 1G (HEMOSTASIS) ×3 IMPLANT
HEMOSTAT SURGICEL 2X14 (HEMOSTASIS) IMPLANT
INSERT FOGARTY XLG (MISCELLANEOUS) IMPLANT
KIT BASIN OR (CUSTOM PROCEDURE TRAY) ×3 IMPLANT
KIT PAIN CUSTOM (MISCELLANEOUS) IMPLANT
KIT ROOM TURNOVER OR (KITS) ×3 IMPLANT
KIT SUCTION CATH 14FR (SUCTIONS) IMPLANT
KIT VASOVIEW W/TROCAR VH 2000 (KITS) IMPLANT
LINE VENT (MISCELLANEOUS) IMPLANT
MARKER GRAFT CORONARY BYPASS (MISCELLANEOUS) IMPLANT
MARKER SKIN DUAL TIP RULER LAB (MISCELLANEOUS) ×3 IMPLANT
NS IRRIG 1000ML POUR BTL (IV SOLUTION) ×9 IMPLANT
PACK OPEN HEART (CUSTOM PROCEDURE TRAY) ×3 IMPLANT
PAD ARMBOARD 7.5X6 YLW CONV (MISCELLANEOUS) ×6 IMPLANT
PEDIATRIC SUCKERS (MISCELLANEOUS) IMPLANT
PENCIL BUTTON HOLSTER BLD 10FT (ELECTRODE) IMPLANT
PUNCH AORTIC ROTATE 4.0MM (MISCELLANEOUS) IMPLANT
PUNCH AORTIC ROTATE 4.5MM 8IN (MISCELLANEOUS) IMPLANT
PUNCH AORTIC ROTATE 5MM 8IN (MISCELLANEOUS) IMPLANT
SET CARDIOPLEGIA MPS 5001102 (MISCELLANEOUS) IMPLANT
SOLUTION ANTI FOG 6CC (MISCELLANEOUS) IMPLANT
SPONGE GAUZE 4X4 12PLY (GAUZE/BANDAGES/DRESSINGS) ×3 IMPLANT
STABILIZER COHN CARD 89 9340 (MISCELLANEOUS) IMPLANT
STOPCOCK 4 WAY LG BORE MALE ST (IV SETS) IMPLANT
SUT BONE WAX W31G (SUTURE) IMPLANT
SUT MNCRL AB 4-0 PS2 18 (SUTURE) IMPLANT
SUT PROLENE 3 0 SH DA (SUTURE) IMPLANT
SUT PROLENE 4 0 RB 1 (SUTURE) ×3
SUT PROLENE 4 0 SH DA (SUTURE) ×3 IMPLANT
SUT PROLENE 4-0 RB1 .5 CRCL 36 (SUTURE) ×6 IMPLANT
SUT PROLENE 6 0 C 1 30 (SUTURE) IMPLANT
SUT PROLENE 8 0 BV175 6 (SUTURE) IMPLANT
SUT SILK  1 MH (SUTURE)
SUT SILK 1 MH (SUTURE) IMPLANT
SUT SILK 1 SH (SUTURE) IMPLANT
SUT SILK 1 TIES 10X30 (SUTURE) ×3 IMPLANT
SUT STEEL 6MS V (SUTURE) ×3 IMPLANT
SUT STEEL STERNAL CCS#1 18IN (SUTURE) IMPLANT
SUT STEEL SZ 6 DBL 3X14 BALL (SUTURE) ×3 IMPLANT
SUT VIC AB 1 CTX 36 (SUTURE) ×1
SUT VIC AB 1 CTX36XBRD ANBCTR (SUTURE) ×2 IMPLANT
SUT VIC AB 2-0 CT1 27 (SUTURE)
SUT VIC AB 2-0 CT1 TAPERPNT 27 (SUTURE) IMPLANT
SUT VIC AB 2-0 CTX 27 (SUTURE) IMPLANT
SUT VIC AB 3-0 SH 27 (SUTURE)
SUT VIC AB 3-0 SH 27X BRD (SUTURE) IMPLANT
SUT VIC AB 3-0 X1 27 (SUTURE) IMPLANT
SUT VICRYL 4-0 PS2 18IN ABS (SUTURE) IMPLANT
SUTURE E-PAK OPEN HEART (SUTURE) ×3 IMPLANT
SYS GUIDANT ACHIEVE OFF PUMP (MISCELLANEOUS) IMPLANT
SYSTEM SAHARA CHEST DRAIN ATS (WOUND CARE) IMPLANT
TAPES RETRACTO (MISCELLANEOUS) IMPLANT
TOWEL OR 17X24 6PK STRL BLUE (TOWEL DISPOSABLE) ×3 IMPLANT
TOWEL OR 17X26 10 PK STRL BLUE (TOWEL DISPOSABLE) ×6 IMPLANT
TRAY FOLEY IC TEMP SENS 16FR (CATHETERS) IMPLANT
TUBING INSUFFLATION 10FT LAP (TUBING) IMPLANT
TUNNELER SHEATH ON-Q 11GX8 (MISCELLANEOUS) IMPLANT
UNDERPAD 30X30 INCONTINENT (UNDERPADS AND DIAPERS) ×3 IMPLANT
WAND VISUFLOW SURG W/TUBING (MISCELLANEOUS) IMPLANT
WATER STERILE IRR 1000ML POUR (IV SOLUTION) ×6 IMPLANT

## 2011-08-29 NOTE — Progress Notes (Signed)
I have seen and examined this patient and agree with the assessment/plan as outlined above by Ashley Royalty MD (PGY2). Renal function apparently better and likely reflects the impact of fluids/improved perfusion. Will continue to monitor for emerging HD needs. Back to OR today to remove Theone Murdoch catheter. Kavir Savoca K.,MD 08/29/2011 9:35 AM

## 2011-08-29 NOTE — Progress Notes (Signed)
Patient ID: Warren [name], male   DOB: 09/23/1945, 66 y.o.   MRN: [id number] CTSP for development of subcutaneous air in left neck/ shoulder after CT removed. No air leak noted prior to removal PCXR shows left pneumothorax and subcutaneous emphysema in left neck  On exam BP 130/70  Pulse 83  Temp(Src) 98.6 F (37 C) (Core (Comment))  Resp 21  Ht 6\' 3"  (1.905 m)  Wt 206 lb 12.7 oz (93.8 kg)  BMI 25.85 kg/m2  SpO2 92% Extensive SQ emphysema in both sides of neck as well as left [place] fossa  IMP: Left pneumothorax with SQ emphysema PLAN: Needs urgent left chest tube Pt is in moderate distress and gives verbal consent with multiple witnesses, including primary RN and Jolinda Croak.

## 2011-08-29 NOTE — Preoperative (Signed)
Beta Blockers   Reason not to administer Beta Blockers:Not Applicable 

## 2011-08-29 NOTE — Transfer of Care (Signed)
Immediate Anesthesia Transfer of Care Note  Patient: Warren [name]  Procedure(s) Performed:  MEDIASTINAL EXPLORATION - removal of Loni Muse catheter  Patient Location: SICU  Anesthesia Type: General  Level of Consciousness: sedated and responds to stimulation  Airway & Oxygen Therapy: Patient remains intubated per anesthesia plan and Patient placed on Ventilator (see vital sign flow sheet for setting)  Post-op Assessment: report to SICU nurse.  Post vital signs: Reviewed and stable Filed Vitals:   08/29/11 0815  BP: 131/77  Pulse: 64  Temp: 37.8 C  Resp: 17    Complications: No apparent anesthesia complications

## 2011-08-29 NOTE — Progress Notes (Signed)
Pt was extubated to 4lpm Axis.  NIF -40, FVC 800.  Good effort.  No stridor noted.  Pt able to vocalize pain score and name.  RN at bedside.  Vitals WNL.  No complications noted.  IS x 5 attempts.

## 2011-08-29 NOTE — Progress Notes (Signed)
Day of Surgery Procedure(s) (LRB): OFF PUMP CORONARY ARTERY BYPASS GRAFTING (CABG) (N/A) MEDIASTINAL EXPLORATION (N/A) Subjective: Intubated, sedated  Objective: Vital signs in last 24 hours: Temp:  [87.4 F (30.8 C)-100 F (37.8 C)] 100 F (37.8 C) (01/17 0745) Pulse Rate:  [60-94] 62  (01/17 0745) Cardiac Rhythm:  [-] Normal sinus rhythm (01/17 0745) Resp:  [12-22] 13  (01/17 0745) BP: (97-158)/(46-79) 129/78 mmHg (01/17 0745) SpO2:  [99 %-100 %] 100 % (01/17 0745) Arterial Line BP: (114-167)/(59-78) 166/69 mmHg (01/17 0745) FiO2 (%):  [50 %] 50 % (01/17 0745) Weight:  [93.8 kg (206 lb 12.7 oz)] 93.8 kg (206 lb 12.7 oz) (01/17 0500)  Hemodynamic parameters for last 24 hours: PAP: (37-49)/(20-31) 43/23 mmHg  Intake/Output from previous day: 01/16 0701 - 01/17 0700 In: 8009.9 [I.V.:3320.4; Blood:3149.5; NG/GT:90; IV Piggyback:1450] Out: 7140 [Urine:3215; Emesis/NG output:100; Blood:3400; Chest Tube:425] Intake/Output this shift: Total I/O In: 30 [NG/GT:30] Out: -   General appearance: sedated Neurologic: sedated Heart: regular rate and rhythm and no murmur Lungs: clear to auscultation bilaterally  Lab Results:  Basename 08/29/11 0320 08/28/11 2330  WBC 6.6 7.4  HGB 10.1* 9.9*  HCT 29.9* 29.8*  PLT 112* 90*   BMET:  Basename 08/29/11 0320 08/28/11 1805 08/28/11 0645  NA 143 144 --  K 3.6 3.9 --  CL 112 -- 107  CO2 21 -- 23  GLUCOSE 123* 121* --  BUN 28* -- 41*  CREATININE 1.35 -- 1.88*  CALCIUM 7.2* -- 8.7    PT/INR:  Basename 08/28/11 1720  LABPROT 25.9*  INR 2.32*   ABG    Component Value Date/Time   PHART 7.386 08/28/2011 2200   HCO3 19.7* 08/28/2011 2200   TCO2 21 08/28/2011 2200   ACIDBASEDEF 5.0* 08/28/2011 2200   O2SAT 68.1 08/29/2011 0344   CBG (last 3)   Basename 08/29/11 0426 08/28/11 2334 08/28/11 2159  GLUCAP 113* 140* 128*    Assessment/Plan: S/P Procedure(s) (LRB): OFF PUMP CORONARY ARTERY BYPASS GRAFTING (CABG) (N/A) MEDIASTINAL  EXPLORATION (N/A) POD #1 AVR, mitral repair CV- stable, no longer needs Swan Ganz catheter, unfortunately swan cannot be pulled out. When attempting to replace swan yesterday evening, it was noted to be fixed and unable to be withdrawn. Pressure monitoring was unaffected but cardiac output were not measurable. In all likelihood the catheter is caught in a suture- most likely an extra pledgeted suture placed in the SVC after the SVC cannula was withdrawn. Plan is to return to OR this AM and reopen sternotomy and remove catheter in OR. No family available to give consent and patient is intubated and sedated and unable to give consent, but there is no other option.  Respiratory- wean and extubate after procedure this AM  Renal:- Creatinine down to 1.35!, I suspect it will rise over next 24- 48 hours, follow  Anemia- better after transfusion  Thrombocytopenia- improved  OOB to chair after extubation    LOS: 27 days    Warren [name] C 08/29/2011

## 2011-08-29 NOTE — Brief Op Note (Addendum)
                   [address]            [place] [address]          [phone]    08/02/2011 - 08/29/2011  9:34 AM  PATIENT:  Warren [name]  67 y.o. male  PRE-OPERATIVE DIAGNOSIS:  Retained Catheter (Swan-Ganz)  POST-OPERATIVE DIAGNOSIS: same PROCEDURE:  Procedure(s): MEDIASTINAL EXPLORATION for removal of Swan-Ganz catheter  SURGEON:  Surgeon(s): Loreli Slot, MD  PHYSICIAN ASSISTANT: Gershon Crane PA-C  ANESTHESIA:   general  PATIENT CONDITION:  ICU - intubated and hemodynamically stable.  COMPLICATIONS : No Known  Findings- Catheter was caught in original SVC pursestring suture, removed intact without difficulty after pursestring was removed

## 2011-08-29 NOTE — Progress Notes (Signed)
  Echocardiogram Echocardiogram Transesophageal has been performed.  Warren, [name] 08/29/2011, 11:30 AM

## 2011-08-29 NOTE — Progress Notes (Signed)
Subjective: Interval History:  S/p AVR and MV repair yesterday.  Going back to OR today as swan-ganz was sewn in and needs to be removed today. Currently sedated on vent.  Objective:  Vital signs in last 24 hours:  Temp:  [87.4 F (30.8 C)-99.9 F (37.7 C)] 99.7 F (37.6 C) (01/17 0600) Pulse Rate:  [60-94] 65  (01/17 0600) Resp:  [12-22] 14  (01/17 0600) BP: (97-158)/(46-79) 126/78 mmHg (01/17 0600) SpO2:  [99 %-100 %] 100 % (01/17 0600) Arterial Line BP: (114-161)/(59-78) 160/67 mmHg (01/17 0600) FiO2 (%):  [50 %] 50 % (01/17 0423) Weight:  [206 lb 12.7 oz (93.8 kg)] 206 lb 12.7 oz (93.8 kg) (01/17 0500) Weight change:   Intake/Output: I/O last 3 completed shifts: In: 8070.9 [P.O.:120; I.V.:3261.4; Blood:3149.5; NG/GT:90; IV Piggyback:1450] Out: 8235 [Urine:4310; Emesis/NG output:100; Blood:3400; Chest Tube:425]  Intake/Output this shift:     MEDS:     . sodium chloride  500 mL Intravenous Once  . acetaminophen (TYLENOL) oral liquid 160 mg/5 mL  650 mg Per Tube NOW   Or  . acetaminophen  650 mg Rectal NOW  . acetaminophen  1,000 mg Oral Q6H   Or  . acetaminophen (TYLENOL) oral liquid 160 mg/5 mL  975 mg Per Tube Q6H  . aspirin EC  325 mg Oral Daily   Or  . aspirin  324 mg Per Tube Daily  . bisacodyl  10 mg Oral Daily   Or  . bisacodyl  10 mg Rectal Daily  . calcium chloride  1 g Intravenous Once  . cefUROXime (ZINACEF)  IV  1.5 g Intravenous To OR  . cefUROXime (ZINACEF)  IV  1.5 g Intravenous Q12H  . docusate sodium  200 mg Oral Daily  . famotidine (PEPCID) IV  20 mg Intravenous Q12H  . insulin aspart  0-24 Units Subcutaneous Q2H   Followed by  . insulin aspart  0-24 Units Subcutaneous Q4H  . insulin regular  0-10 Units Intravenous TID WC  . magnesium sulfate  4 g Intravenous Once  . metoprolol tartrate  12.5 mg Oral BID   Or  . metoprolol tartrate  12.5 mg Per Tube BID  . nitroglycerin/verapamil/heparin solution irrigation for artery spasm   Irrigation To OR    . pantoprazole  40 mg Oral Q1200  . potassium chloride  10 mEq Intravenous Q1 Hr x 3  . sodium bicarbonate  150 mEq Intravenous Once  . sodium chloride  3 mL Intravenous Q12H  . vancomycin (VANCOCIN) IVPB 1000 mg/100 mL central line  1,000 mg Intravenous Once  . vancomycin  1,250 mg Intravenous To OR  . DISCONTD: aminocaproic acid (AMICAR) for OHS   Intravenous To OR  . DISCONTD: aminocaproic acid (AMICAR) IVPB  5 g Intravenous Once  . DISCONTD: aminocaproic acid (AMICAR) IVPB  5 g Intravenous Once  . DISCONTD: amLODipine  5 mg Oral Daily  . DISCONTD: cefUROXime (ZINACEF)  IV  750 mg Intravenous To OR  . DISCONTD: chlorhexidine  60 mL Topical Once  . DISCONTD: chlorhexidine  15 mL Mouth/Throat BID  . DISCONTD: dexmedetomidine (PRECEDEX) IV infusion for high rates  0.1-0.7 mcg/kg/hr Intravenous To OR  . DISCONTD: DOPamine  2-20 mcg/kg/min Intravenous To OR  . DISCONTD: epinephrine  0.5-20 mcg/min Intravenous To OR  . DISCONTD: feeding supplement  1 Container Oral TID WC  . DISCONTD: hydrALAZINE  50 mg Oral Q8H  . DISCONTD: insulin (NOVOLIN-R) infusion   Intravenous To OR  . DISCONTD: labetalol  200 mg  Oral BID  . DISCONTD: magnesium sulfate  40 mEq Other To OR  . DISCONTD: neomycin-bacitracin-polymyxin   Topical QID  . DISCONTD: nitroGLYCERIN  2-200 mcg/min Intravenous To OR  . DISCONTD: phenylephrine (NEO-SYNEPHRINE) Adult infusion  30-200 mcg/min Intravenous To OR  . DISCONTD: potassium chloride  80 mEq Other To OR  . DISCONTD: sertraline  150 mg Oral Daily      . sodium chloride 20 mL/hr at 08/29/11 0600  . sodium chloride 20 mL/hr at 08/29/11 0600  . sodium chloride    . dexmedetomidine (PRECEDEX) IV infusion 0.7 mcg/kg/hr (08/29/11 0600)  . DOPamine 3 mcg/kg/min (08/29/11 0600)  . insulin (NOVOLIN-R) infusion    . lactated ringers    . nitroGLYCERIN Stopped (08/29/11 0600)  . phenylephrine (NEO-SYNEPHRINE) Adult infusion 10 mcg/min (08/28/11 2000)  . DISCONTD: sodium  chloride 50 mL/hr at 08/27/11 1601  . DISCONTD: dexmedetomidine (PRECEDEX) IV infusion for high rates    . DISCONTD: dexmedetomidine (PRECEDEX) IV infusion for high rates    albumin human, lactated ringers, metoprolol, midazolam, morphine injection, morphine, ondansetron (ZOFRAN) IV, oxyCODONE, sodium chloride, DISCONTD: 0.9 % irrigation (POUR BTL), DISCONTD: 0.9 % irrigation (POUR BTL), DISCONTD: 0.9 % irrigation (POUR BTL), DISCONTD: ALPRAZolam, DISCONTD: ALPRAZolam, DISCONTD: fentaNYL, DISCONTD: hemostatic agents, DISCONTD: HYDROcodone-acetaminophen, DISCONTD: midazolam, DISCONTD: ondansetron DISCONTD: oxyCODONE-acetaminophen, DISCONTD: Surgifoam 1 Gm with 0.9% sodium chloride (4 ml) topical solution, DISCONTD: traZODone, DISCONTD: zolpidem  EXAM: deferred, pt in OR GEN- Sedated on vent CVS- Distant heart sounds, no rubs, or murmur RS- Mild expiratory wheezing, good bilateral air entry EXT- tr edema   Lab Results:  Basename 08/29/11 0320 08/28/11 2330 08/28/11 1805 08/28/11 1339 08/28/11 0645  WBC 6.6 7.4 -- -- 6.3  HGB 10.1* 9.9* 11.6* -- --  HCT 29.9* 29.8* 34.0* -- --  PLT 112* 90* -- 110* --   BMET  Basename 08/29/11 0320 08/28/11 1805 08/28/11 1606 08/28/11 0645 08/27/11 1503 08/27/11 0515  NA 143 144 143 -- -- --  K 3.6 3.9 4.0 -- -- --  CL 112 -- -- 107 107 --  CO2 21 -- -- 23 21 --  GLUCOSE 123* 121* 156* -- -- --  BUN 28* -- -- 41* 47* --  CREATININE 1.35 -- -- 1.88* 2.07* --  CALCIUM 7.2* -- -- 8.7 8.3* --  PHOS -- -- -- 2.5 -- 3.2   LFT  Basename 08/28/11 0645 08/27/11 1503  PROT -- 5.4*  ALBUMIN 3.1* --  AST -- 11  ALT -- 9  ALKPHOS -- 102  BILITOT -- 0.8  BILIDIR -- --  IBILI -- --   PT/INR  Basename 08/28/11 1720 08/27/11 1503  LABPROT 25.9* 20.3*  INR 2.32* 1.70*   Hepatitis Panel No results found for this basename: HEPBSAG,HCVAB,HEPAIGM,HEPBIGM in the last 72 hours PTH: Lab Results  Component Value Date   PTH 61.2 08/21/2011   CALCIUM 7.2*  08/29/2011   CAION 1.11* 10/24/2008   PHOS 2.5 08/28/2011    Studies/Results: Dg Chest Portable 1 View  08/28/2011  *RADIOLOGY REPORT*  Clinical Data: post op cardiac surgery  PORTABLE CHEST - 1 VIEW  Comparison: 08/26/11  Findings: Endotracheal tube 3.8 cm above the carina.  Swan-Ganz catheter in the central pulmonary outflow tract.  Median sternotomy noted.  Prosthetic heart valve present.  Mediastinal drains and chest tubes noted.  Heart is enlarged with mild vascular congestion/edema and basilar atelectasis.  Left lower lobe retrocardiac consolidation/collapse evident.  Decrease in the right effusion.  No pneumothorax.  IMPRESSION: Endotracheal tube 3.8  cm above the carina.  Other support apparatus in satisfactory position.  Mild vascular congestion versus interstitial edema  Improving right pleural effusion.  No pneumothorax  Basilar atelectasis worse in the left lower lobe  Original Report Authenticated By: Judie Petit. Ruel Favors, M.D.    Assessment/Plan: 66 yo M with known CKD stage 3/4 developed acute kidney injury while in hospital marked by elevated Cr and oliguria, with only identifiable insults possible volume depletion with some hypotension at time of dental extractions. 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 Acute on chronic kidney injury:  Improved to better than baseline with Cr down to 1.35 today, this may trend up over the next 48 hours, monitor cr closely.  Baseline Cr 2-2.5 (see initial consult note for trends). Great UOP. 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 Etiology of AKI also not entirely clear. Suspected acute kidney injury from relative hypotension during mouth surgery and volume depletion, now significantly improved . 2. 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 Lasix as per above. S/p AVR and mitral repair, to go back to OR today for swan ganz removal 3.  HTN: BP elevated this AM on amlodipine. Anticipate better  control with additional lasix. F/u post-operatively as likely to experience wide fluid shifts.   LOS: 27 Warren [name] 08/27/10  7:43 AM

## 2011-08-29 NOTE — Progress Notes (Signed)
After giving 1mg  IV ativan, pt became cooperative and was willing to discuss progressing measures of care. Described to pt the process of removing chest tubes and a-line. Pt was in agreement with removing chest tubes and was following all commands. Prior to removal of chest tube min. Air leak was noted. Mid sternal dressing and chest tube dressing was removed, sutures were cut in the normal fashion and Vaseline gauze was placed around all  chest tubes per protocol. Pt was instructed to take a deep breath in and hold the breath with removal of chest tubes. Pt followed instructions and held his breath while chest tubes were removed. Sutures were then tied to close incisions while Vaseline gauze remained over chest tube sites. After removal of chest tubes noted that the patient was having some sub-q air on the left lateral side of the throat extending down to the clavicle. Area was marked with a pen and MD was paged. Stat portable chest x-ray was ordered. Pt denies any difficultly breathing or swallowing. Oxygen sats remain stable while on 4 L nasal cannula, as well as other vitals remaining stable. Will cont to monitor pt.

## 2011-08-29 NOTE — Anesthesia Postprocedure Evaluation (Signed)
  Anesthesia Post-op Note  Patient: Warren [name]  Procedure(s) Performed:  MEDIASTINAL EXPLORATION - removal of Loni Muse catheter  Patient Location: SICU  Anesthesia Type: General  Level of Consciousness: sedated and unresponsive  Airway and Oxygen Therapy: Patient remains intubated per anesthesia plan and Patient placed on Ventilator (see vital sign flow sheet for setting)  Post-op Pain: none  Post-op Assessment: Post-op Vital signs reviewed, Patient's Cardiovascular Status Stable, Respiratory Function Stable, Patent Airway, No signs of Nausea or vomiting and Pain level controlled  Post-op Vital Signs: stable  Complications: No apparent anesthesia complications

## 2011-08-29 NOTE — Progress Notes (Signed)
Patient ID: Warren [name], male   DOB: 01-30-1946, 66 y.o.   MRN: [id number] BP 133/77  Pulse 82  Temp(Src) 98.6 F (37 C) (Core (Comment))  Resp 23  Ht 6\' 3"  (1.905 m)  Wt 206 lb 12.7 oz (93.8 kg)  BMI 25.85 kg/m2  SpO2 99% Stable now after replacement of chest tubes No distress On dopamine  Lab Results  Component Value Date   CREATININE 1.50* 08/29/2011   BUN 23 08/29/2011   NA 151* 08/29/2011   K 4.1 08/29/2011   CL 113* 08/29/2011   CO2 21 08/29/2011  Monitor urine output  [name] MD  Beeper [phone] Office [phone]

## 2011-08-29 NOTE — Progress Notes (Signed)
Extubated BP 123/72  Pulse 73  Temp(Src) 97.2 F (36.2 C) (Core (Comment))  Resp 9  Ht 6\' 3"  (1.905 m)  Wt 93.8 kg (206 lb 12.7 oz)  BMI 25.85 kg/m2  SpO2 92%  No focal neurologic deficits but he's confused and paranoid States " There was no surgery" "I don't know what to believe" "something's changed, colors changed" Will order PRN Haldol in case he becomes agitated.

## 2011-08-29 NOTE — Progress Notes (Signed)
CXR after left chest tube placed shows the tube in good position. There is still some residual left pneumothorax. More importantly there is now a large right pneumothorax!  Needs urgent right chest tube. Pt aware of diagnosis and need for tube and gives verbal consent with multiple witnesses present.

## 2011-08-29 NOTE — Procedures (Addendum)
Chest Tube Insertion Procedure Note  Indications:  Clinically significant Right Pneumothorax  Pre-operative Diagnosis: Right Pneumothorax  Post-operative Diagnosis: Right Pneumothorax  Procedure Details  Informed consent was obtained for the procedure, including sedation.  Risks of lung perforation, hemorrhage, arrhythmia, and adverse drug reaction were discussed.   After sterile skin prep, using standard technique, a 20 French tube was placed in the right anterior 3rd rib space.  Findings: + air leak  Estimated Blood Loss:  Minimal         Specimens:  None              Complications:  None; patient tolerated the procedure well.         Disposition: ICU - extubated and stable.         Condition: stable  Attending Attestation: I performed the procedure.

## 2011-08-29 NOTE — Procedures (Signed)
Chest Tube Insertion Procedure Note  Indications:  Clinically significant Left Pneumothorax  Pre-operative Diagnosis: Left Pneumothorax  Post-operative Diagnosis: Left Pneumothorax  Procedure Details  Informed consent was obtained for the procedure, including sedation.  Risks of lung perforation, hemorrhage, arrhythmia, and adverse drug reaction were discussed.   After sterile skin prep, using standard technique, a 20 French tube was placed in the left anterior 3rd rib space.  Findings: + air leak initially  Estimated Blood Loss:  Minimal         Specimens:  None              Complications:  None; patient tolerated the procedure well.         Disposition: remains in ICU         Condition: stable  Attending Attestation: I performed the procedure.

## 2011-08-29 NOTE — Anesthesia Preprocedure Evaluation (Signed)
Anesthesia Evaluation  Patient identified by MRN, date of birth, ID band Patient awake    Reviewed: Allergy & Precautions, H&P , NPO status , Patient's Chart, lab work & pertinent test results  Airway       Dental   Pulmonary neg pulmonary ROS,    Pulmonary exam normal       Cardiovascular hypertension, + angina +CHF + dysrhythmias regular Normal    Neuro/Psych PSYCHIATRIC DISORDERS CVA    GI/Hepatic negative GI ROS,   Endo/Other  Negative Endocrine ROS  Renal/GU negative Renal ROS  Genitourinary negative   Musculoskeletal   Abdominal   Peds  Hematology negative hematology ROS (+)   Anesthesia Other Findings   Reproductive/Obstetrics                           Anesthesia Physical Anesthesia Plan  ASA: III  Anesthesia Plan: General   Post-op Pain Management:    Induction: Intravenous  Airway Management Planned: Oral ETT  Additional Equipment: Arterial line, CVP and PA Cath  Intra-op Plan:   Post-operative Plan:   Informed Consent: I have reviewed the patients History and Physical, chart, labs and discussed the procedure including the risks, benefits and alternatives for the proposed anesthesia with the patient or authorized representative who has indicated his/her understanding and acceptance.     Plan Discussed with: CRNA, Anesthesiologist and Surgeon  Anesthesia Plan Comments:         Anesthesia Quick Evaluation

## 2011-08-29 NOTE — Op Note (Signed)
NAME:  Warren [name], Warren [name] NO.:  [id number]  MEDICAL RECORD NO.:  [id number]  LOCATION:  2315                         FACILITY:  [facility]  PHYSICIAN:  Salvatore Decent. Dorris Fetch, M.D.DATE OF BIRTH:  Mar 28, 1946  DATE OF PROCEDURE:  08/28/2011 DATE OF DISCHARGE:  08/28/2011                              OPERATIVE REPORT   PREOPERATIVE DIAGNOSIS:  Severe aortic and mitral regurgitation.  POSTOPERATIVE DIAGNOSIS: Severe aortic and mitral regurgitation.  PROCEDURE:  Redo median sternotomy, mitral valve repair with 32-mm Edwards Physio 2 annuloplasty ring (model 5200, serial [id number]), aortic valve replacement with Eamc - Lanier Ease pericardial valve 21 mm (model 3300 TFX, serial #[id number]).  SURGEON: Salvatore Decent. Dorris Fetch, MD  ASSISTANT:  Rowe Clack, PA-C  ANESTHESIA:  General.  FINDINGS:  Severe mitral and aortic regurgitation on prebypass TEE, dilated and hypokinetic left ventricle.  Intra op aortic valve tricuspid, distortion of the annular anatomy and leaflets due to scarring from previous dissection repair, attempted resuspension of the valve resulted in mild aortic insufficiency, therefore valve replaced.  Mitral valve with posterior annular dilatation, no prolapsing of the leaflets, intact subvalvular apparatus.  Postbypass transesophageal echocardiography; no change in left ventricular function.  No residual mitral regurgitation.  Good function of the prosthetic aortic valve with no perivalvular leaks.  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  He now presents with heart failure due to severe aortic and mitral insufficiency and needs correction of those anomalies.  The patient's operation was delayed initially by needing dental extractions for decaying teeth and subsequently by development of acute renal failure.  Both of those issues  have improved and the patient is now ready for surgery albeit at high risk for perioperative morbidity and mortality.  The indications, risks, benefits, and alternatives were discussed in detail with the patient. He understood the high-risk nature of the procedure and agreed to proceed.  OPERATIVE NOTE:  Warren [name] was brought to the preop holding area on August 28, 2011.  There, the Anesthesia service placed a Swan-Ganz catheter and arterial blood pressure monitoring line.  Intravenous antibiotics were administered.  The patient was taken to the operating room, anesthetized, and intubated.  Transesophageal echocardiography was performed.  Findings as previously noted.  There was severe aortic insufficiency and mitral regurgitation as well as a dilated globally hypokinetic left ventricle.  A Foley catheter was placed.  The chest, abdomen, and legs were prepped and draped in the usual sterile fashion.  A redo median sternotomy was performed.  The incision was made through the previous scar.  The soft tissue was divided with electrocautery. The wires were removed.  The sternum was divided with an oscillating saw.  Once the sternum had been divided, the underlying mediastinal tissue was dissected off the underside of the sternum with electrocautery.  Hemostasis was achieved.  A retractor was placed. Initial dissection was begun dissecting out the right side of the heart and a portion of the diaphragmatic surface of the heart.  Superiorly, the dissection was carried up to the graft.  At this point, 5000 units of heparin was given.  A 22-French arterial cannula was placed into the right femoral artery.  The artery was initially accessed with a needle, wire was then passed, and the tract sequentially dilated.  The arterial cannula passed without difficulty.  It was connected to the bypass circuit and secured with silk sutures.  At this point, the remainder of the full heparin dose was  given.  Additional dissection was carried out exposing the superior vena cava.  A pursestring suture was placed in the superior vena cava and a 31-French metal tip right-angle venous cannula was placed via pursestring suture into the superior vena cava and secured.  Cardiopulmonary bypass was commenced once adequate anticoagulation with ACT measurement was confirmed.  A separate pursestring suture was placed in the inferior aspect of the right atrium and a 36-French cannula was placed through this pursestring into the inferior vena cava.  Caval tapes were then placed around both vessels and were secured during the open portion of the procedure.  The remainder of the intrapericardial adhesions were taken down.  A retrograde cardioplegic cannula was placed via pursestring suture in the right atrium and directed in the coronary sinus.  A foam pad was placed in the pericardium to insulate the heart.  A temperature probe was placed in myocardial septum.  A cardioplegic cannula was placed in the ascending aortic graft for use as a vent.  The patient was cooled to 28 degrees Celsius.  Flows were maintained per protocol.Carbon dioxide was insufflated into the operative field during the procedure until the aortotomy was closed at the completion of the AVR.  The aortic graft was cross-clamped.  Cardiac arrest was achieved with cold retrograde blood cardioplegia.  Initially, one liter of cardioplegia was administered.  There was a good diastolic arrest and adequate septal cooling to 10 degrees Celsius.  The aortic graft was incised and additional cardioplegia was administered into the ostia of the left main and right coronary.  An additional 500 mL of antegrade cardioplegia was administered.  Additional retrograde cardioplegic doses were administered at 20-minute intervals during the cross-clamp portion of the procedure.  The caval tapes were tightened.  The right atrium was incised.   The interatrial septum was identified.  An incision was made in the interatrial septum, and the incision was then lengthened superiorly and inferiorly.  The mitral retractor was placed exposing the mitral valve. There was marked posterior annular dilatation.  There was no prolapse of the anterior or posterior leaflets and the subvalvular apparatus was intact with no prolapsing or flail cords.  The anterior leaflet was sized for a 32 mm physio 2 annuloplasty ring.  The 2-0 Ethibond horizontal mattress sutures then were placed circumferentially in the annulus.  A total of 12 sutures were utilized.  Placement of annular sutures was particularly difficult in the region of the previous aortic dissection repair and at the anterior lateral commissure.  The sutures then were passed through the cuff of the annuloplasty ring which was lowered into place and the sutures were sequentially tied.  The sizing was confirmed with the commissural sutures after all the sutures had been placed before selecting the ring.  After the ring was in place, the left ventricle was distended as well as possible with iced saline and there was good coaptation of the leaflets and no residual mitral regurgitation.  The left atriotomy was closed in 2 layers with a running 4-0 Prolene sutures.  The first was a horizontal mattress suture followed by running simple suture.  De-airing was performed  as the sutures were tied.  The right atriotomy then was closed in 2 layers in a similar fashion and again de-airing was performed as the sutures were tied.  The incision in the graft then was lengthened.  The aortic valve was inspected.  The left and non coronary cusp appeared intact with a normal morphology.  The right coronary cusp was also intact, but it appeared that with scarring, there had been relatively lengthening of the anulus on the right side with some foreshortening of the anulus on the left and non coronary cusps,  which resulted in the right coronary cusp being slightly lower than the other two and the source of the aortic insufficiency.  As a trial, sutures were placed to bring the right coronary cusp up to the level of the left and non coronary and the valve appeared to coapt well.  Decision was made to close the graft and then do a trial wean to see if the aortic valve would be competent.  The graft incision was closed with a running 4-0 Prolene suture.  Again, de- airing was performed as the graft was closed.After the graft was closed, the aortic cross-clamp was removed.  The patient required 3 trials to achieve defibrillation.  A ventricular pacing wire was placed and a pacing was initiated while observing with transesophageal echocardiography.  The patient then was weaned from bypass.  Dopamine infusion was initiated at 5 mcg/kg/minute. Initially after weaning, there was trace aortic insufficiency.  This did progress to mild aortic insufficiency over the next several minutes, and decision made to go ahead and replace the valve at that point as the valve was not completely competent.  The aortic cross-clamp was replaced.  Cardiac arrest was again achieved with retrograde cardioplegia followed by direct antegrade cardioplegia into the coronary ostium after the graft had been reopened.  The valve leaflets were excised and the annulus sized for a 21 mm Magna Ease pericardial tissue valve as the patient was not a candidate for Coumadin.  2-0 Ethibond horizontal mattress sutures with subannular pledgets were placed circumferentially around the anulus.  The valve was prepared per manufacturers recommendations. After the valve had been rinsed, the sutures were passed through the sewing ring of the valve.  The valve was lowered into place and the sutures were sequentially tied.  The valve seated nicely and there were no gaps in the annulus.  The coronary ostia were inspected to ensure they were not  compromised.  Rewarming was begun.  The graft aortotomy was closed with running 4-0 Prolene suture.  The patient was placed in Trendelenburg position. Lidocaine was administered.  A warm dose of retrograde cardioplegia was administered.  Additional de-airing maneuvers were performed, and the aortic cross-clamp was removed.  Total cross-clamp time was 192 minutes.  The patient required a single defibrillation and then was in sinus rhythm thereafter although relatively bradycardic around 60 beats per minute.  The superior vena cava cannula was redirected into the right atrium.  The inferior vena cava cannula was removed.  The retrograde cardioplegic cannula was removed.  Atrial pacing wires were placed on the right atrium and DDD pacing was initiated.  The dopamine infusion was increased to 8 mcg/kg/minute.  The left ventricular vent was removed.  When the patient rewarmed to a core temperature of 37 degrees Celsius, he was weaned from cardiopulmonary bypass.  He was DDD paced at 90 beats per minute on 10 mcg/kg of dopamine at the time of separation from bypass.  The  total bypass time was 254 minutes.  Postbypass transesophageal echocardiography revealed good function of the prosthetic aortic valve.  No residual mitral regurgitation.  There was no change in left ventricular global hypokinesis.  A test dose protamine was administered and was well tolerated.  The right femoral cannula was removed when the remainder of the protamine had been administered and pressure was held on this throughout the remainder of the procedure by the assistant.  The superior vena caval cannula was removed.  Hemostasis was achieved.  The patient remained hemodynamically stable throughout the post bypass period, although cardiac outputs could not be determined due to problems with the Theone Murdoch catheter.  The patient maintained a stable blood pressure, heart rate, and pulmonary arterial pressures.  The  patient was given blood for anemia and platelets for thrombocytopenia after administration of the protamine.  The chest was copiously irrigated with warm saline.  A right- angle chest tube was placed in the left pleura, a 28-French Blake drain was placed in the right pleura, and a 32-French chest tube was placed in the mediastinum and the pericardium could not reapproximated.  The sternum was closed with interrupted single and double heavy gauge stainless steel wires.  The pectoralis fascia, subcutaneous tissue, and skin were closed in standard fashion.  All sponge, needle, and instrument counts were correct at the end of the procedure.  At this point, before taking the drapes off the patient, it was noted there was bleeding from the right groin.  Additional pressure was then held there for an additional 45 minutes while periodically checking the pulse in the foot.  There ultimately was good hemostasis.  The patient was transported from the operating room to the Surgical Intensive Care Unit in critical, but stable condition.     Salvatore Decent Dorris Fetch, M.D.     SCH/MEDQ  D:  08/28/2011  T:  08/29/2011  Job:  [id number]

## 2011-08-30 ENCOUNTER — Inpatient Hospital Stay (HOSPITAL_COMMUNITY): Payer: Medicare Other

## 2011-08-30 LAB — GLUCOSE, CAPILLARY
Glucose-Capillary: 101 mg/dL — ABNORMAL HIGH (ref 70–99)
Glucose-Capillary: 120 mg/dL — ABNORMAL HIGH (ref 70–99)
Glucose-Capillary: 123 mg/dL — ABNORMAL HIGH (ref 70–99)

## 2011-08-30 LAB — POCT I-STAT 4, (NA,K, GLUC, HGB,HCT)
HCT: 36 % — ABNORMAL LOW (ref 39.0–52.0)
Sodium: 144 mEq/L (ref 135–145)

## 2011-08-30 LAB — BASIC METABOLIC PANEL
Calcium: 8.4 mg/dL (ref 8.4–10.5)
GFR calc Af Amer: 66 mL/min — ABNORMAL LOW (ref >90)
GFR calc non Af Amer: 57 mL/min — ABNORMAL LOW (ref >90)
Sodium: 147 mEq/L — ABNORMAL HIGH (ref 135–145)

## 2011-08-30 LAB — POCT I-STAT, CHEM 8
BUN: 26 mg/dL — ABNORMAL HIGH (ref 6–23)
Creatinine, Ser: 1.5 mg/dL — ABNORMAL HIGH (ref 0.50–1.35)
Glucose, Bld: 113 mg/dL — ABNORMAL HIGH (ref 70–99)
Hemoglobin: 11.6 g/dL — ABNORMAL LOW (ref 13.0–17.0)
Potassium: 4.1 mEq/L (ref 3.5–5.1)

## 2011-08-30 LAB — CBC
MCH: 25.5 pg — ABNORMAL LOW (ref 26.0–34.0)
MCH: 25.3 pg — ABNORMAL LOW (ref 26.0–34.0)
MCHC: 32.6 g/dL (ref 30.0–36.0)
MCHC: 32.3 g/dL (ref 30.0–36.0)
MCV: 78.3 fL (ref 78.0–100.0)
Platelets: 135 10*3/uL — ABNORMAL LOW (ref 150–400)
Platelets: 147 10*3/uL — ABNORMAL LOW (ref 150–400)
RBC: 4.2 MIL/uL — ABNORMAL LOW (ref 4.22–5.81)
RBC: 4.23 MIL/uL (ref 4.22–5.81)
RDW: 17.6 % — ABNORMAL HIGH (ref 11.5–15.5)
RDW: 17.6 % — ABNORMAL HIGH (ref 11.5–15.5)

## 2011-08-30 LAB — CREATININE, SERUM: Creatinine, Ser: 1.67 mg/dL — ABNORMAL HIGH (ref 0.50–1.35)

## 2011-08-30 LAB — MAGNESIUM: Magnesium: 2 mg/dL (ref 1.5–2.5)

## 2011-08-30 MED ORDER — POTASSIUM CHLORIDE 10 MEQ/100ML IV SOLN: 10.0000 meq | INTRAVENOUS | Status: DC

## 2011-08-30 MED ORDER — POTASSIUM CHLORIDE 10 MEQ/50ML IV SOLN
10.0000 meq | INTRAVENOUS | Status: AC
  Administered 2011-08-30 (×3): 10 meq via INTRAVENOUS

## 2011-08-30 MED ORDER — METOPROLOL TARTRATE 25 MG PO TABS
25.0000 mg | ORAL_TABLET | Freq: Two times a day (BID) | ORAL | Status: DC
  Administered 2011-08-30 – 2011-09-01 (×5): 25 mg via ORAL
  Filled 2011-08-30 (×6): qty 1

## 2011-08-30 MED ORDER — FUROSEMIDE 10 MG/ML IJ SOLN
40.0000 mg | Freq: Two times a day (BID) | INTRAMUSCULAR | Status: AC
  Administered 2011-08-30 (×2): 40 mg via INTRAVENOUS
  Filled 2011-08-30 (×2): qty 4

## 2011-08-30 MED FILL — Electrolyte-R (PH 7.4) Solution: INTRAVENOUS | Qty: 6000 | Status: AC

## 2011-08-30 MED FILL — Sodium Chloride IV Soln 0.9%: INTRAVENOUS | Qty: 1000 | Status: AC

## 2011-08-30 MED FILL — Lidocaine HCl IV Inj 20 MG/ML: INTRAVENOUS | Qty: 15 | Status: AC

## 2011-08-30 MED FILL — Mannitol IV Soln 20%: INTRAVENOUS | Qty: 500 | Status: AC

## 2011-08-30 MED FILL — Sodium Bicarbonate IV Soln 8.4%: INTRAVENOUS | Qty: 50 | Status: AC

## 2011-08-30 MED FILL — Heparin Sodium (Porcine) Inj 1000 Unit/ML: INTRAMUSCULAR | Qty: 30 | Status: AC

## 2011-08-30 MED FILL — Heparin Sodium (Porcine) Inj 1000 Unit/ML: INTRAMUSCULAR | Qty: 60 | Status: AC

## 2011-08-30 MED FILL — Albumin, Human Inj 5%: INTRAVENOUS | Qty: 250 | Status: AC

## 2011-08-30 NOTE — Progress Notes (Signed)
1 Day Post-Op Procedure(s) (LRB): MEDIASTINAL EXPLORATION (N/A) Subjective: Feels better today Pain well controlled for the most part No SOB   Objective: Vital signs in last 24 hours: Temp:  [96.8 F (36 C)-100 F (37.8 C)] 98.1 F (36.7 C) (01/18 0346) Pulse Rate:  [62-93] 87  (01/18 0700) Cardiac Rhythm:  [-] Normal sinus rhythm (01/18 0700) Resp:  [9-25] 21  (01/18 0700) BP: (109-167)/(62-105) 157/85 mmHg (01/18 0700) SpO2:  [88 %-100 %] 97 % (01/18 0700) Arterial Line BP: (141-173)/(47-70) 142/47 mmHg (01/17 1515) FiO2 (%):  [50 %] 50 % (01/17 1230) Weight:  [91.2 kg (201 lb 1 oz)] 91.2 kg (201 lb 1 oz) (01/18 0600)  Hemodynamic parameters for last 24 hours: PAP: (43-45)/(23-24) 45/24 mmHg  Intake/Output from previous day: 01/17 0701 - 01/18 0700 In: 2803.7 [I.V.:2573.7; NG/GT:30; IV Piggyback:200] Out: 3075 [Urine:2065; Blood:800; Chest Tube:210] Intake/Output this shift:    General appearance: alert and no distress Neurologic: intact and less paranoid, confused Heart: regular rate and rhythm and systolic murmur Lungs: diminished breath sounds bibasilar Abdomen: normal findings: soft, non-tender SQ emphysema still present but decreased Lab Results:  Basename 08/30/11 0400 08/29/11 1837 08/29/11 1830  WBC 12.6* -- 10.0  HGB 10.7* 10.9* --  HCT 32.8* 32.0* --  PLT 135* -- 108*   BMET:  Basename 08/30/11 0400 08/29/11 1837 08/29/11 0320  NA 147* 151* --  K 4.1 4.1 --  CL 115* 113* --  CO2 23 -- 21  GLUCOSE 85 108* --  BUN 25* 23 --  CREATININE 1.28 1.50* --  CALCIUM 8.4 -- 7.2*    PT/INR:  Basename 08/28/11 1720  LABPROT 25.9*  INR 2.32*   ABG    Component Value Date/Time   PHART 7.333* 08/29/2011 1218   HCO3 20.4 08/29/2011 1218   TCO2 23 08/29/2011 1837   ACIDBASEDEF 5.0* 08/29/2011 1218   O2SAT 98.0 08/29/2011 1218   CBG (last 3)   Basename 08/30/11 0344 08/30/11 0002 08/29/11 1901  GLUCAP 83 120* 114*    Assessment/Plan: S/P Procedure(s)  (LRB): MEDIASTINAL EXPLORATION (N/A) Mobilize Diuresis CV stable- hypertensive, increase BB Resp: Bilateral pneumothorax- resolved with CT, no air leaks, both tubes to water seal Renal: Creatinine remains low, wean dopamine, diurese Deconditioning- PT, ambulate as tolerated   LOS: 28 days    HENDRICKSON,STEVEN C 08/30/2011

## 2011-08-30 NOTE — Progress Notes (Signed)
Subjective: Interval History:   Swan-ganz removed yesterday, developed bilateral pneumothorax post-operatively.  Chest tubes inserted. Objective:  Vital signs in last 24 hours:  Temp:  [96.8 F (36 C)-100 F (37.8 C)] 98.1 F (36.7 C) (01/18 0346) Pulse Rate:  [62-93] 92  (01/18 0600) Resp:  [9-25] 25  (01/18 0600) BP: (109-167)/(62-105) 150/74 mmHg (01/18 0600) SpO2:  [88 %-100 %] 93 % (01/18 0600) Arterial Line BP: (141-173)/(47-72) 142/47 mmHg (01/17 1515) FiO2 (%):  [50 %] 50 % (01/17 1230) Weight:  [201 lb 1 oz (91.2 kg)] 201 lb 1 oz (91.2 kg) (01/18 0600) Weight change: -5 lb 11.7 oz (-2.6 kg)  Intake/Output: I/O last 3 completed shifts: In: 10465.3 [I.V.:5595.8; Blood:3149.5; NG/GT:120; IV Piggyback:1600] Out: 9225 [Urine:4380; Emesis/NG output:100; Blood:4200; Chest Tube:545]  Intake/Output this shift:  Total I/O In: 328.3 [I.V.:278.3; IV Piggyback:50] Out: 900 [Urine:900]  MEDS:     . acetaminophen  1,000 mg Oral Q6H   Or  . acetaminophen (TYLENOL) oral liquid 160 mg/5 mL  975 mg Per Tube Q6H  . aspirin EC  325 mg Oral Daily   Or  . aspirin  324 mg Per Tube Daily  . bisacodyl  10 mg Oral Daily   Or  . bisacodyl  10 mg Rectal Daily  . cefUROXime (ZINACEF)  IV  1.5 g Intravenous Q12H  . docusate sodium  200 mg Oral Daily  . insulin aspart  0-24 Units Subcutaneous Q4H  . magnesium sulfate  4 g Intravenous Once  . metoprolol tartrate  12.5 mg Oral BID   Or  . metoprolol tartrate  12.5 mg Per Tube BID  . midazolam  4 mg Intravenous Once  .  morphine injection  4 mg Intravenous Once  . pantoprazole  40 mg Oral Q1200  . sodium bicarbonate  50 mEq Intravenous Once  . sodium chloride  3 mL Intravenous Q12H  . vancomycin (VANCOCIN) IVPB 1000 mg/100 mL central line  1,000 mg Intravenous Once  . DISCONTD: acetaminophen (TYLENOL) oral liquid 160 mg/5 mL  650 mg Per Tube NOW  . DISCONTD: acetaminophen (TYLENOL) oral liquid 160 mg/5 mL  975 mg Per Tube Q6H  . DISCONTD:  acetaminophen (TYLENOL) oral liquid 160 mg/5 mL  975 mg Per Tube Q6H  . DISCONTD: acetaminophen  650 mg Rectal NOW  . DISCONTD: acetaminophen  1,000 mg Oral Q6H  . DISCONTD: acetaminophen  1,000 mg Oral Q6H  . DISCONTD: aspirin  324 mg Per Tube Daily  . DISCONTD: aspirin EC  325 mg Oral Daily  . DISCONTD: bisacodyl  10 mg Oral Daily  . DISCONTD: bisacodyl  10 mg Rectal Daily  . DISCONTD: cefUROXime (ZINACEF)  IV  1.5 g Intravenous Q12H  . DISCONTD: docusate sodium  200 mg Oral Daily  . DISCONTD: famotidine (PEPCID) IV  20 mg Intravenous Q12H  . DISCONTD: famotidine (PEPCID) IV  20 mg Intravenous Q12H  . DISCONTD: insulin aspart  0-24 Units Subcutaneous Q2H  . DISCONTD: insulin aspart  0-24 Units Subcutaneous Q4H  . DISCONTD: insulin regular  0-10 Units Intravenous TID WC  . DISCONTD: insulin regular  0-10 Units Intravenous TID WC  . DISCONTD: magnesium sulfate  4 g Intravenous Once  . DISCONTD: metoprolol tartrate  12.5 mg Per Tube BID  . DISCONTD: metoprolol tartrate  12.5 mg Per Tube BID  . DISCONTD: metoprolol tartrate  12.5 mg Oral BID  . DISCONTD: metoprolol tartrate  12.5 mg Oral BID  . DISCONTD: pantoprazole  40 mg Oral Q1200  .  DISCONTD: potassium chloride  10 mEq Intravenous Q1 Hr x 3  . DISCONTD: sodium chloride  3 mL Intravenous Q12H      . sodium chloride Stopped (08/29/11 1030)  . sodium chloride 20 mL/hr at 08/30/11 0600  . sodium chloride    . DOPamine 3 mcg/kg/min (08/30/11 0600)  . lactated ringers Stopped (08/29/11 1030)  . nitroGLYCERIN Stopped (08/29/11 1030)  . phenylephrine (NEO-SYNEPHRINE) Adult infusion Stopped (08/29/11 1030)  . DISCONTD: sodium chloride Stopped (08/29/11 0800)  . DISCONTD: sodium chloride 20 mL/hr at 08/29/11 0800  . DISCONTD: sodium chloride    . DISCONTD: dexmedetomidine (PRECEDEX) IV infusion 0.7 mcg/kg/hr (08/29/11 0800)  . DISCONTD: dexmedetomidine (PRECEDEX) IV infusion Stopped (08/29/11 1100)  . DISCONTD: DOPamine 3 mcg/kg/min  (08/29/11 0800)  . DISCONTD: insulin (NOVOLIN-R) infusion    . DISCONTD: insulin (NOVOLIN-R) infusion Stopped (08/29/11 1030)  . DISCONTD: lactated ringers    . DISCONTD: nitroGLYCERIN Stopped (08/29/11 0600)  . DISCONTD: phenylephrine (NEO-SYNEPHRINE) Adult infusion 10 mcg/min (08/28/11 2000)  albumin human, lactated ringers, LORazepam, metoprolol, morphine injection, morphine, ondansetron (ZOFRAN) IV, oxyCODONE, sodium chloride, DISCONTD: albumin human, DISCONTD: metoprolol, DISCONTD: midazolam, DISCONTD: midazolam, DISCONTD:  morphine injection, DISCONTD: morphine, DISCONTD: ondansetron (ZOFRAN) IV, DISCONTD: oxyCODONE, DISCONTD: sodium chloride, DISCONTD: sodium chloride irrigation  EXAM: deferred, pt in OR GEN- Sedated on vent CVS- Distant heart sounds, no rubs, or murmur RS- Mild expiratory wheezing, good bilateral air entry EXT- tr edema   Lab Results:  Basename 08/30/11 0400 08/29/11 1837 08/29/11 1830 08/29/11 0320  WBC 12.6* -- 10.0 6.6  HGB 10.7* 10.9* 10.5* --  HCT 32.8* 32.0* 32.4* --  PLT 135* -- 108* 112*   BMET  Basename 08/30/11 0400 08/29/11 1837 08/29/11 1830 08/29/11 0320 08/28/11 0645  NA 147* 151* -- 143 --  K 4.1 4.1 -- 3.6 --  CL 115* 113* -- 112 --  CO2 23 -- -- 21 23  GLUCOSE 85 108* -- 123* --  BUN 25* 23 -- 28* --  CREATININE 1.28 1.50* 1.38* -- --  CALCIUM 8.4 -- -- 7.2* 8.7  PHOS -- -- -- -- 2.5   LFT  Basename 08/28/11 0645 08/27/11 1503  PROT -- 5.4*  ALBUMIN 3.1* --  AST -- 11  ALT -- 9  ALKPHOS -- 102  BILITOT -- 0.8  BILIDIR -- --  IBILI -- --   PT/INR  Basename 08/28/11 1720 08/27/11 1503  LABPROT 25.9* 20.3*  INR 2.32* 1.70*   Hepatitis Panel No results found for this basename: HEPBSAG,HCVAB,HEPAIGM,HEPBIGM in the last 72 hours PTH: Lab Results  Component Value Date   PTH 61.2 08/21/2011   CALCIUM 8.4 08/30/2011   CAION 1.13 08/29/2011   PHOS 2.5 08/28/2011    Studies/Results: Dg Chest Port 1 View  08/29/2011   *RADIOLOGY REPORT*  Clinical Data: Right chest tube placement for pneumothorax.  PORTABLE CHEST - 1 VIEW  Comparison: 08/29/2011 at 1740 hours.  Findings: No definite residual right pneumothorax after placement of a right chest tube.  Subcutaneous emphysema in the neck is extensive.  No definite left pneumothorax with left chest tube in place.  Bibasilar air space disease with collapse/consolidation in the left lower lobe.  Right IJ catheter sheath remains in place, as do epicardial pacer wires. Suspect right pleural effusion.  IMPRESSION:  1.  No definite residual pneumothoraces with bilateral chest tubes in place. 2.  Extensive subcutaneous emphysema in the neck. 3.  Bibasilar air space disease with left lower lobe collapse/consolidation. 4.  Suspect right pleural  effusion.  Original Report Authenticated By: Reyes Ivan, M.D.   Dg Chest Port 1 View  08/29/2011  *RADIOLOGY REPORT*  Clinical Data: Post chest tube insertion  PORTABLE CHEST - 1 VIEW  Comparison: Portable exam 1740 hours compared to 1550 hours same date  Findings: Interval placement of left thoracostomy tube. Left pneumothorax remains, little changed. Also identified is a small right pneumothorax, in retrospect unchanged. Enlargement of cardiac silhouette post median sternotomy, AVR, and MVR. Bibasilar atelectasis. Mediastinal gas extending into cervical region bilaterally. No acute osseous findings. Atherosclerotic calcifications aorta. Right jugular catheter tip projects over SVC.  IMPRESSION: Persistent left pneumothorax despite thoracostomy tube insertion. Small right pneumothorax. Bibasilar atelectasis.  Critical value called to [facility] on 2300 at 1750 hours on 08/29/2011.  Original Report Authenticated By: Lollie Marrow, M.D.   Dg Chest Port 1 View  08/29/2011  *RADIOLOGY REPORT*  Clinical Data: Post tube removal  PORTABLE CHEST - 1 VIEW  Comparison: 08/29/2011  Findings: Cardiomediastinal silhouette is stable.  Again noted  status post median sternotomy and aortic valve replacement.  Right IJ sheath is unchanged in position.  There is a small right pleural effusion with right basilar atelectasis or infiltrate.  The left chest tube has been removed.  There is a small left upper pneumothorax measures about 2.5 cm maximum thickness.  IMPRESSION:  There is a small right pleural effusion with right basilar atelectasis or infiltrate.  The left chest tube has been removed. There is a small left upper pneumothorax measures about 2.5 cm maximum thickness.  Original Report Authenticated By: Natasha Mead, M.D.   Dg Chest Portable 1 View  08/29/2011  *RADIOLOGY REPORT*  Clinical Data: Post cardiac surgery  PORTABLE CHEST - 1 VIEW  Comparison: 5:45 hours  Findings: Stable cardiomegaly.  Swan-Ganz removed with the right IJ vein introducer left in place.  NG tube, endotracheal tube, chest tubes stable without pneumothorax.  Bilateral hazy airspace disease nearly resolved.  IMPRESSION: Improved airspace disease.  No pneumothorax.  Original Report Authenticated By: Donavan Burnet, M.D.   Dg Chest Portable 1 View In Am  08/29/2011  *RADIOLOGY REPORT*  Clinical Data: Postop open heart surgery  PORTABLE CHEST - 1 VIEW  Comparison: 08/28/2011; 08/26/2011; 08/20/2028  Findings: Grossly unchanged cardiac silhouette and mediastinal contours post median sternotomy and valve repair.  Enteric tube tip projects over the expected location of the distal aspect of the esophagus.  Otherwise, appropriate position support apparatus.  No pneumothorax.  Interval decrease in small right-sided pleural effusion with improved aeration of the right lung base. Minimal increase in left-sided pleural effusion and left basilar/retrocardiac heterogeneous / consolidative opacities. Grossly unchanged bones.  IMPRESSION: 1.  Enteric tube tip now projects over the distal esophagus. Advancement approximately 12 cm is recommended.  Otherwise, appropriately positioned support  apparatus.  No pneumothorax. 2.  Interval decrease in right-sided pleural effusion with minimal increase in left-sided effusion and left basilar opacities favored to represent atelectasis.  These results will be called to the ordering clinician or representative by the Radiologist Assistant, and communication documented in the PACS Dashboard.  Original Report Authenticated By: Waynard Reeds, M.D.   Dg Chest Portable 1 View  08/28/2011  *RADIOLOGY REPORT*  Clinical Data: post op cardiac surgery  PORTABLE CHEST - 1 VIEW  Comparison: 08/26/11  Findings: Endotracheal tube 3.8 cm above the carina.  Swan-Ganz catheter in the central pulmonary outflow tract.  Median sternotomy noted.  Prosthetic heart valve present.  Mediastinal drains and chest  tubes noted.  Heart is enlarged with mild vascular congestion/edema and basilar atelectasis.  Left lower lobe retrocardiac consolidation/collapse evident.  Decrease in the right effusion.  No pneumothorax.  IMPRESSION: Endotracheal tube 3.8 cm above the carina.  Other support apparatus in satisfactory position.  Mild vascular congestion versus interstitial edema  Improving right pleural effusion.  No pneumothorax  Basilar atelectasis worse in the left lower lobe  Original Report Authenticated By: Judie Petit. Ruel Favors, M.D.    Assessment/Plan: 66 yo M with known CKD stage 3/4 developed acute kidney injury while in hospital marked by elevated Cr and oliguria, with only identifiable insults possible volume depletion with some hypotension at time of dental extractions. 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 Acute on chronic kidney injury:  Improved to better than baseline with Cr down to 1.28 today..  Baseline Cr 2-2.5 (see initial consult note for trends). Still with good UOP. 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 Etiology of AKI also not entirely clear. Suspected acute kidney injury from relative hypotension  during mouth surgery and volume depletion, now significantly improved .  2. 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 Lasix as per above. S/p AVR and mitral repair 3.  HTN: BP moderately elevated, currently on metoprolol 4. Pneumothorax: Bilateral PTX, chest tubes in place.  Management per CVTS  Creatinine stable with good UOP.  Will sign off at this time.  Please call back with further questions if needed.  LOS: 28 Warren [name] 08/27/10  7:43 AM

## 2011-08-30 NOTE — Progress Notes (Signed)
Nutrition Follow-up  Pt transferred to 2300; s/p redo median sternotomy, mitral valve repair, aortic valve replacement 1/16.   Diet Order:  Carbohydrate Modified Medium Calorie (advanced this AM). PO intake fairly poor prior to surgery 15-50% per flowsheet records; also receiving Resource Breeze supplement.  Meds: Scheduled Meds:   . acetaminophen  1,000 mg Oral Q6H   Or  . acetaminophen (TYLENOL) oral liquid 160 mg/5 mL  975 mg Per Tube Q6H  . aspirin EC  325 mg Oral Daily   Or  . aspirin  324 mg Per Tube Daily  . bisacodyl  10 mg Oral Daily   Or  . bisacodyl  10 mg Rectal Daily  . cefUROXime (ZINACEF)  IV  1.5 g Intravenous Q12H  . docusate sodium  200 mg Oral Daily  . furosemide  40 mg Intravenous BID  . insulin aspart  0-24 Units Subcutaneous Q4H  . magnesium sulfate  4 g Intravenous Once  . metoprolol tartrate  25 mg Oral BID  . midazolam  4 mg Intravenous Once  .  morphine injection  4 mg Intravenous Once  . pantoprazole  40 mg Oral Q1200  . potassium chloride  10 mEq Intravenous Q1 Hr x 3  . sodium bicarbonate  50 mEq Intravenous Once  . sodium chloride  3 mL Intravenous Q12H  . vancomycin (VANCOCIN) IVPB 1000 mg/100 mL central line  1,000 mg Intravenous Once  . DISCONTD: famotidine (PEPCID) IV  20 mg Intravenous Q12H  . DISCONTD: metoprolol tartrate  12.5 mg Per Tube BID  . DISCONTD: metoprolol tartrate  12.5 mg Oral BID  . DISCONTD: potassium chloride  10 mEq Intravenous Q1 Hr x 3   Continuous Infusions:   . sodium chloride Stopped (08/29/11 1030)  . sodium chloride 20 mL/hr at 08/30/11 1000  . sodium chloride 250 mL (08/30/11 0600)  . DOPamine Stopped (08/30/11 1000)  . lactated ringers Stopped (08/29/11 1030)  . nitroGLYCERIN Stopped (08/29/11 1030)  . phenylephrine (NEO-SYNEPHRINE) Adult infusion Stopped (08/29/11 1030)  . DISCONTD: dexmedetomidine (PRECEDEX) IV infusion Stopped (08/29/11 1100)   PRN Meds:.albumin human, lactated ringers, LORazepam,  metoprolol, morphine injection, morphine, ondansetron (ZOFRAN) IV, oxyCODONE, sodium chloride, DISCONTD: midazolam  Labs:  CMP     Component Value Date/Time   NA 147* 08/30/2011 0400   K 4.1 08/30/2011 0400   CL 115* 08/30/2011 0400   CO2 23 08/30/2011 0400   GLUCOSE 85 08/30/2011 0400   BUN 25* 08/30/2011 0400   CREATININE 1.28 08/30/2011 0400   CREATININE 2.19* 06/26/2008 2130   CALCIUM 8.4 08/30/2011 0400   PROT 5.4* 08/27/2011 1503   ALBUMIN 3.1* 08/28/2011 0645   AST 11 08/27/2011 1503   ALT 9 08/27/2011 1503   ALKPHOS 102 08/27/2011 1503   BILITOT 0.8 08/27/2011 1503   GFRNONAA 57* 08/30/2011 0400   GFRAA 66* 08/30/2011 0400     Intake/Output Summary (Last 24 hours) at 08/30/11 1158 Last data filed at 08/30/11 1003  Gross per 24 hour  Intake  930.6 ml  Output   1975 ml  Net -1044.4 ml    Weight Status:  91.2 kg (1/18) -- trending up  Re-estimated needs:  2,000-2,200 kcals, 100-120 gm protein  Nutrition Dx:  Inadequate Oral Intake, ongoing  Goal:  Meet >90% of estimated nutrition needs with % meal consumption & PO supplements to promote post-op healing, currently unmet Monitor: PO intake, labs, weight, I/O's  Intervention:    Add Ensure Clinical Strength PO TID  RD to follow for  nutrition care plan  Alger Memos Pager #:  [phone]

## 2011-08-30 NOTE — Op Note (Signed)
NAME:  Warren [name], QUEST NO.:  [id number]  MEDICAL RECORD NO.:  [id number]  LOCATION:  2315                         FACILITY:  [facility]  PHYSICIAN:  Salvatore Decent. Dorris Fetch, M.D.DATE OF BIRTH:  06-28-1946  DATE OF PROCEDURE:  08/29/2011 DATE OF DISCHARGE:  08/28/2011                              OPERATIVE REPORT   PREOPERATIVE DIAGNOSIS:  Retained Swan-Ganz catheter.  POSTOPERATIVE DIAGNOSIS:  Retained Swan-Ganz catheter.  PROCEDURE:  Mediastinal reexploration for removal of Swan-Ganz catheter.  SURGEON:  Salvatore Decent. Dorris Fetch, MD  ASSISTANT:  Rowe Clack, PA-C  ANESTHESIA:  General.  FINDINGS:  Catheter entrapped on superior vena caval pursestring suture. Catheter removed with ease once the suture was cut.  CLINICAL NOTE:  Warren [name] is a 66 year old gentleman who on August 28, 2011, had undergone a redo sternotomy for aortic valve replacement and mitral valve repair.  In the early postoperative period, it was noted that his Swan-Ganz catheter was not functioning correctly.  Pressure measurement was accurate, however cardiac outputs could not be obtained. An attempt was made to change out the Swan-Ganz catheter at the bedside in the ICU.  However the indwelling catheter could not be removed.  The patient was stable at that point and decision was made to correct the patient's coagulopathy and anemia and electively take him to the operating room for re-exploration to allow the catheter to be removed on the morning of August 29, 2011.  The patient was intubated and sedated and unable to give consent.  There was no family available to consent.  OPERATIVE NOTE:  Warren [name] was brought from the ICU directly to the operating room on August 29, 2011.  There the anesthesia service induced general anesthesia.  The dressing was removed.  The incision was intact.  The chest, abdomen and groins were prepped and draped in usual sterile fashion.  The skin staples were  removed.  The sutures in the subcutaneous tissue and pectoralis fascia were cut.  The sternal wires were removed and a sternal retractor was placed.  There were no unusual findings on opening the sternum.  There was a minimal amount of fluid and clot in the mediastinum.  The left and right pleural tubes Were in place.  The suspicion was that the catheter may have been hung on a pledgeted Prolene suture that have been placed at the superior vena caval cannulation site after removal of the superior vena caval Cannula. The suture was cut and removed.  However the catheter remained fixed in place but it was clear that the traction was at the superior vena cava.  A 4-0 Prolene pursestring suture then was placed around the preexisting pursestring.  The preexisting pursestring was cut and the Swan-Ganz catheter was easily removed.  There was some tear through of the suture when it was tied down and a second 4-0 Prolene suture was used to control bleeding.  This was pledgeted.  There was good hemostasis thereafter.  The chest was copiously irrigated with 2 L of warm saline.  Chest tubes were placed back into their locations. Hemostasis was achieved.  The sternum was closed with a combination of single and double heavy gauge stainless steel wires.  The pectoralis fascia and subcutaneous tissue were closed with running sutures.  The skin was closed with staples.  The patient was stable throughout the procedure and he was taken to the ICU in stable condition.     Salvatore Decent Dorris Fetch, M.D.     SCH/MEDQ  D:  08/29/2011  T:  08/30/2011  Job:  [id number]

## 2011-08-30 NOTE — Progress Notes (Signed)
CSW unable to assess as pt sedated and resting at present. CSW spoke with RN who expressed concerned about pt mental health, possible capacity issues. CSW hopes to complete assessment Monday, and will follow to address psychosocial issues and to facilitate placement.   Baxter Flattery, MSW [phone]

## 2011-08-30 NOTE — Progress Notes (Signed)
I have seen and examined this patient and agree with the assessment/plan as outlined above by Ashley Royalty MD ([id number]). Patient's renal function continues to improve and UOP is excellent and labs without any indications for intervention. Encourage PO fluids to help correct hypernatremia.   Will sign off for now- please call with any questions/concerns Collen Hostler K.,MD 08/30/2011 9:59 AM

## 2011-08-30 NOTE — Progress Notes (Signed)
Patient examined and record reviewed.Hemodynamics stable,labs satisfactory.Patient had stable day.Continue current care.  Min pleural tube drainage Dopamine weaned off and urine output good Lasix 40 BID  VAN TRIGT III,PETER 08/30/2011

## 2011-08-31 ENCOUNTER — Inpatient Hospital Stay (HOSPITAL_COMMUNITY): Payer: Medicare Other

## 2011-08-31 LAB — CBC
Hemoglobin: 10.8 g/dL — ABNORMAL LOW (ref 13.0–17.0)
MCH: 25.8 pg — ABNORMAL LOW (ref 26.0–34.0)
MCV: 79.4 fL (ref 78.0–100.0)
RBC: 4.18 MIL/uL — ABNORMAL LOW (ref 4.22–5.81)

## 2011-08-31 LAB — BASIC METABOLIC PANEL
CO2: 24 mEq/L (ref 19–32)
Calcium: 8.5 mg/dL (ref 8.4–10.5)
Chloride: 111 mEq/L (ref 96–112)
Glucose, Bld: 130 mg/dL — ABNORMAL HIGH (ref 70–99)
Sodium: 144 mEq/L (ref 135–145)

## 2011-08-31 LAB — GLUCOSE, CAPILLARY
Glucose-Capillary: 120 mg/dL — ABNORMAL HIGH (ref 70–99)
Glucose-Capillary: 110 mg/dL — ABNORMAL HIGH (ref 70–99)
Glucose-Capillary: 112 mg/dL — ABNORMAL HIGH (ref 70–99)

## 2011-08-31 MED ORDER — FUROSEMIDE 10 MG/ML IJ SOLN
40.0000 mg | Freq: Once | INTRAMUSCULAR | Status: AC
  Administered 2011-08-31: 40 mg via INTRAVENOUS
  Filled 2011-08-31: qty 4

## 2011-08-31 MED ORDER — FUROSEMIDE 10 MG/ML IJ SOLN
40.0000 mg | Freq: Two times a day (BID) | INTRAMUSCULAR | Status: DC
  Administered 2011-08-31 – 2011-09-02 (×5): 40 mg via INTRAVENOUS
  Filled 2011-08-31 (×7): qty 4

## 2011-08-31 NOTE — Progress Notes (Signed)
Subjective:   Mr. Tener is a 66 yo s/p AVR and MV repair.  He had reexploration for a retained Swan Ganz catheter 2 days ago.  He remains weak.        Marland Kitchen acetaminophen  1,000 mg Oral Q6H   Or  . acetaminophen (TYLENOL) oral liquid 160 mg/5 mL  975 mg Per Tube Q6H  . aspirin EC  325 mg Oral Daily   Or  . aspirin  324 mg Per Tube Daily  . bisacodyl  10 mg Oral Daily   Or  . bisacodyl  10 mg Rectal Daily  . cefUROXime (ZINACEF)  IV  1.5 g Intravenous Q12H  . docusate sodium  200 mg Oral Daily  . furosemide  40 mg Intravenous BID  . furosemide  40 mg Intravenous BID  . insulin aspart  0-24 Units Subcutaneous Q4H  . magnesium sulfate  4 g Intravenous Once  . metoprolol tartrate  25 mg Oral BID  . pantoprazole  40 mg Oral Q1200  . potassium chloride  10 mEq Intravenous Q1 Hr x 3  . sodium chloride  3 mL Intravenous Q12H      . sodium chloride Stopped (08/29/11 1030)  . sodium chloride 20 mL/hr at 08/31/11 0600  . sodium chloride 250 mL (08/30/11 0600)  . DOPamine Stopped (08/30/11 1000)  . lactated ringers Stopped (08/29/11 1030)  . nitroGLYCERIN Stopped (08/29/11 1030)  . phenylephrine (NEO-SYNEPHRINE) Adult infusion Stopped (08/29/11 1030)    Objective:  Vital Signs in the last 24 hours: Blood pressure 146/99, pulse 97, temperature 98 F (36.7 C), temperature source Oral, resp. rate 24, height 6\' 3"  (1.905 m), weight 198 lb 3.1 oz (89.9 kg), SpO2 91.00%. Temp:  [98 F (36.7 C)-99.1 F (37.3 C)] 98 F (36.7 C) (01/19 1122) Pulse Rate:  [79-97] 97  (01/19 1100) Resp:  [16-24] 24  (01/19 0900) BP: (135-164)/(74-114) 146/99 mmHg (01/19 1100) SpO2:  [91 %-97 %] 91 % (01/19 1100) Weight:  [198 lb 3.1 oz (89.9 kg)] 198 lb 3.1 oz (89.9 kg) (01/19 0600)  Intake/Output from previous day: 01/18 0701 - 01/19 0700 In: 2523.9 [P.O.:1800; I.V.:465.9; IV Piggyback:258] Out: 2510 [Urine:2510] Intake/Output from this shift: Total I/O In: 330 [P.O.:250; I.V.:80] Out: 565  [Urine:305; Chest Tube:260]  Physical Exam: The patient is appears somewhat discheveled.   HEENT:   the sclera are nonicteric.  The mucous membranes are moist.  The carotids are 2+ without bruits.  There is no thyromegaly.  There is no JVD.    Lungs: clear.  The chest wall is  tender.  Chest tube in  Heart: regular rate with a normal S1 and S2.  There are no murmurs, gallops, or rubs. The PMI is not displaced.   Bandage in sternotomy  Abdomen: good bowel sounds.  There is no guarding or rebound.  There is no hepatosplenomegaly or tenderness.  There are no masses.   Extremities:  no clubbing, cyanosis, or edema.  The legs are without rashes.  The distal pulses are intact.   Neuro:  Cranial nerves II - XII are intact.  Motor and sensory functions are intact.     Lab Results:  Basename 08/31/11 0400 08/30/11 1757 08/30/11 1749  WBC 15.0* -- 15.9*  HGB 10.8* 11.6* --  PLT 166 -- 147*    Basename 08/31/11 0400 08/30/11 1757 08/30/11 0400  NA 144 148* --  K 4.0 4.1 --  CL 111 114* --  CO2 24 -- 23  GLUCOSE  130* 113* --  BUN 29* 26* --  CREATININE 1.71* 1.50* --   No results found for this basename: TROPONINI:2,CK,MB:2 in the last 72 hours No results found for this basename: BNP in the last 72 hours Hepatic Function Panel No results found for this basename: PROT,ALBUMIN,AST,ALT,ALKPHOS,BILITOT,BILIDIR,IBILI in the last 72 hours Lab Results  Component Value Date   CHOL  Value: 75        ATP III CLASSIFICATION:  <200     mg/dL   Desirable  161-096  mg/dL   Borderline High  >=045    mg/dL   High        11/18/8117   HDL 16* 10/22/2008   LDLCALC  Value: 49        Total Cholesterol/HDL:CHD Risk Coronary Heart Disease Risk Table                     Men   Women  1/2 Average Risk   3.4   3.3  Average Risk       5.0   4.4  2 X Average Risk   9.6   7.1  3 X Average Risk  23.4   11.0        Use the calculated Patient Ratio above and the CHD Risk Table to determine the patient's CHD Risk.         ATP III CLASSIFICATION (LDL):  <100     mg/dL   Optimal  147-829  mg/dL   Near or Above                    Optimal  130-159  mg/dL   Borderline  562-130  mg/dL   High  >865     mg/dL   Very High 7/84/6962   TRIG 52 10/22/2008   CHOLHDL 4.7 10/22/2008    Basename 08/28/11 1720  INR 2.32*    Tele:  NSR  Assessment/Plan:   1. 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  Slow progress.   No active cardiac issues.    Vesta Mixer, Montez Hageman., MD, [facility] 08/31/2011, 12:11 PM LOS: Day 29

## 2011-08-31 NOTE — Progress Notes (Signed)
Patient ID: Warren [name], male   DOB: 17-Sep-1945, 66 y.o.   MRN: [id number] TCTS DAILY PROGRESS NOTE                   [address]            [place] [address]          [phone]      2 Days Post-Op Procedure(s) (LRB): MEDIASTINAL EXPLORATION (N/A)  Total Length of Stay:  LOS: 29 days   Subjective: Less confused today, requires a lot of help to get up and move around  Objective: Vital signs in last 24 hours: Temp:  [98 F (36.7 C)-99.1 F (37.3 C)] 99.1 F (37.3 C) (01/19 0721) Pulse Rate:  [79-97] 97  (01/19 1100) Cardiac Rhythm:  [-] Normal sinus rhythm (01/19 0800) Resp:  [16-24] 24  (01/19 0900) BP: (135-164)/(74-114) 146/99 mmHg (01/19 1100) SpO2:  [91 %-97 %] 91 % (01/19 1100) Weight:  [198 lb 3.1 oz (89.9 kg)] 198 lb 3.1 oz (89.9 kg) (01/19 0600)  Wt Readings from Last 3 Encounters:  08/31/11 198 lb 3.1 oz (89.9 kg)  08/31/11 198 lb 3.1 oz (89.9 kg)  08/31/11 198 lb 3.1 oz (89.9 kg)   Weight change: -2 lb 13.9 oz (-1.3 kg)       Intake/Output from previous day: 01/18 0701 - 01/19 0700 In: 2523.9 [P.O.:1800; I.V.:465.9; IV Piggyback:258] Out: 2510 [Urine:2510]  Intake/Output this shift: Total I/O In: 330 [P.O.:250; I.V.:80] Out: 565 [Urine:305; Chest Tube:260]  Current Meds: Scheduled Meds:   . acetaminophen  1,000 mg Oral Q6H   Or  . acetaminophen (TYLENOL) oral liquid 160 mg/5 mL  975 mg Per Tube Q6H  . aspirin EC  325 mg Oral Daily   Or  . aspirin  324 mg Per Tube Daily  . bisacodyl  10 mg Oral Daily   Or  . bisacodyl  10 mg Rectal Daily  . cefUROXime (ZINACEF)  IV  1.5 g Intravenous Q12H  . docusate sodium  200 mg Oral Daily  . furosemide  40 mg Intravenous BID  . furosemide  40 mg Intravenous BID  . insulin aspart  0-24 Units Subcutaneous Q4H  . magnesium sulfate  4 g Intravenous Once  . metoprolol tartrate  25 mg Oral BID  . pantoprazole  40 mg Oral Q1200  . potassium chloride  10 mEq Intravenous Q1 Hr x 3  .  sodium chloride  3 mL Intravenous Q12H   Continuous Infusions:   . sodium chloride Stopped (08/29/11 1030)  . sodium chloride 20 mL/hr at 08/31/11 0600  . sodium chloride 250 mL (08/30/11 0600)  . DOPamine Stopped (08/30/11 1000)  . lactated ringers Stopped (08/29/11 1030)  . nitroGLYCERIN Stopped (08/29/11 1030)  . phenylephrine (NEO-SYNEPHRINE) Adult infusion Stopped (08/29/11 1030)   PRN Meds:.albumin human, LORazepam, metoprolol, morphine, ondansetron (ZOFRAN) IV, oxyCODONE, sodium chloride  General appearance: alert, cooperative and slowed mentation Neurologic: intact Heart: regular rate and rhythm, S1, S2 normal, no murmur, click, rub or gallop Lungs: diminished breath sounds bilaterally  Lab Results: CBC: Basename 08/31/11 0400 08/30/11 1757 08/30/11 1749  WBC 15.0* -- 15.9*  HGB 10.8* 11.6* --  HCT 33.2* 34.0* --  PLT 166 -- 147*   BMET:  Basename 08/31/11 0400 08/30/11 1757 08/30/11 0400  NA 144 148* --  K 4.0 4.1 --  CL 111 114* --  CO2 24 -- 23  GLUCOSE 130* 113* --  BUN 29* 26* --  CREATININE 1.71* 1.50* --  CALCIUM 8.5 -- 8.4    PT/INR:  Basename 08/28/11 1720  LABPROT 25.9*  INR 2.32*   Radiology: Dg Chest Port 1 View  08/31/2011  *RADIOLOGY REPORT*  Clinical Data: Status post heart surgery.  PORTABLE CHEST - 1 VIEW  Comparison: 08/30/2011.  Findings: Prior median sternotomy and valve replacement.  Bilateral chest tubes remain in place.  Subcutaneous air bilaterally.  No visible pneumothorax.  Bilateral airspace opacities, likely edema with layering effusions.  This is worsened since prior study.  IMPRESSION: Worsening bilateral airspace disease/edema and bilateral effusions.  Original Report Authenticated By: Cyndie Chime, M.D.   Dg Chest Portable 1 View In Am  08/30/2011  *RADIOLOGY REPORT*  Clinical Data: Postop open heart surgery  PORTABLE CHEST - 1 VIEW  Comparison: Portable chest x-ray of 08/29/2011  Findings: Bilateral chest tubes remain.  No  pneumothorax is seen. Bibasilar opacities persist consistent with effusions and atelectasis.  Cardiomegaly is stable.  A venous sheath remains in the SVC and there is little change in subcutaneous air in the soft tissues of the neck.  IMPRESSION:  1.  Bilateral chest tubes remain with no pneumothorax. 2.  Bibasilar opacities consistent with effusions and atelectasis.  Original Report Authenticated By: Juline Patch, M.D.   Dg Chest Port 1 View  08/29/2011  *RADIOLOGY REPORT*  Clinical Data: Right chest tube placement for pneumothorax.  PORTABLE CHEST - 1 VIEW  Comparison: 08/29/2011 at 1740 hours.  Findings: No definite residual right pneumothorax after placement of a right chest tube.  Subcutaneous emphysema in the neck is extensive.  No definite left pneumothorax with left chest tube in place.  Bibasilar air space disease with collapse/consolidation in the left lower lobe.  Right IJ catheter sheath remains in place, as do epicardial pacer wires. Suspect right pleural effusion.  IMPRESSION:  1.  No definite residual pneumothoraces with bilateral chest tubes in place. 2.  Extensive subcutaneous emphysema in the neck. 3.  Bibasilar air space disease with left lower lobe collapse/consolidation. 4.  Suspect right pleural effusion.  Original Report Authenticated By: Reyes Ivan, M.D.   Dg Chest Port 1 View  08/29/2011  *RADIOLOGY REPORT*  Clinical Data: Post chest tube insertion  PORTABLE CHEST - 1 VIEW  Comparison: Portable exam 1740 hours compared to 1550 hours same date  Findings: Interval placement of left thoracostomy tube. Left pneumothorax remains, little changed. Also identified is a small right pneumothorax, in retrospect unchanged. Enlargement of cardiac silhouette post median sternotomy, AVR, and MVR. Bibasilar atelectasis. Mediastinal gas extending into cervical region bilaterally. No acute osseous findings. Atherosclerotic calcifications aorta. Right jugular catheter tip projects over SVC.   IMPRESSION: Persistent left pneumothorax despite thoracostomy tube insertion. Small right pneumothorax. Bibasilar atelectasis.  Critical value called to [facility] on 2300 at 1750 hours on 08/29/2011.  Original Report Authenticated By: Lollie Marrow, M.D.   Dg Chest Port 1 View  08/29/2011  *RADIOLOGY REPORT*  Clinical Data: Post tube removal  PORTABLE CHEST - 1 VIEW  Comparison: 08/29/2011  Findings: Cardiomediastinal silhouette is stable.  Again noted status post median sternotomy and aortic valve replacement.  Right IJ sheath is unchanged in position.  There is a small right pleural effusion with right basilar atelectasis or infiltrate.  The left chest tube has been removed.  There is a small left upper pneumothorax measures about 2.5 cm maximum thickness.  IMPRESSION:  There is a small right pleural effusion with right basilar atelectasis or infiltrate.  The left chest tube has been removed. There is a small left upper pneumothorax measures about 2.5 cm maximum thickness.  Original Report Authenticated By: Natasha Mead, M.D.     Assessment/Plan: S/P Procedure(s) (LRB): MEDIASTINAL EXPLORATION (N/A) Mobilize Diuresis Worsening bilateral airspace disease/edema and bilateral effusions Leave chest tubes one more day Leave foley to monitor renal function, cr up to 1.7 PT  [name] MD  Beeper [phone] Office [phone] 08/31/2011 11:15 AM
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
## 2011-08-31 NOTE — Progress Notes (Signed)
Attempted to ambulate patient with 2 assist. Pt was unable to stand up straight nor bear weight without maximum 2 person assist. Pt returned to bed- will get consult for physical therapy Felipa Emory

## 2011-09-01 ENCOUNTER — Inpatient Hospital Stay (HOSPITAL_COMMUNITY): Payer: Medicare Other

## 2011-09-01 LAB — BASIC METABOLIC PANEL
BUN: 29 mg/dL — ABNORMAL HIGH (ref 6–23)
CO2: 25 mEq/L (ref 19–32)
Calcium: 8.9 mg/dL (ref 8.4–10.5)
Chloride: 106 mEq/L (ref 96–112)
Creatinine, Ser: 1.73 mg/dL — ABNORMAL HIGH (ref 0.50–1.35)
GFR calc Af Amer: 46 mL/min — ABNORMAL LOW (ref >90)
GFR calc non Af Amer: 40 mL/min — ABNORMAL LOW (ref >90)
Glucose, Bld: 125 mg/dL — ABNORMAL HIGH (ref 70–99)
Potassium: 3.6 mEq/L (ref 3.5–5.1)
Sodium: 141 mEq/L (ref 135–145)

## 2011-09-01 LAB — CBC
HCT: 33.1 % — ABNORMAL LOW (ref 39.0–52.0)
Hemoglobin: 10.5 g/dL — ABNORMAL LOW (ref 13.0–17.0)
MCH: 25.1 pg — ABNORMAL LOW (ref 26.0–34.0)
MCHC: 31.7 g/dL (ref 30.0–36.0)
MCV: 79.2 fL (ref 78.0–100.0)
Platelets: 183 10*3/uL (ref 150–400)
RBC: 4.18 MIL/uL — ABNORMAL LOW (ref 4.22–5.81)
RDW: 17.6 % — ABNORMAL HIGH (ref 11.5–15.5)
WBC: 14.1 10*3/uL — ABNORMAL HIGH (ref 4.0–10.5)

## 2011-09-01 LAB — PROTIME-INR
INR: 1.67 — ABNORMAL HIGH (ref 0.00–1.49)
Prothrombin Time: 20 seconds — ABNORMAL HIGH (ref 11.6–15.2)

## 2011-09-01 LAB — GLUCOSE, CAPILLARY
Glucose-Capillary: 125 mg/dL — ABNORMAL HIGH (ref 70–99)
Glucose-Capillary: 109 mg/dL — ABNORMAL HIGH (ref 70–99)
Glucose-Capillary: 108 mg/dL — ABNORMAL HIGH (ref 70–99)
Glucose-Capillary: 108 mg/dL — ABNORMAL HIGH (ref 70–99)

## 2011-09-01 MED ORDER — POTASSIUM CHLORIDE CRYS ER 20 MEQ PO TBCR
20.0000 meq | EXTENDED_RELEASE_TABLET | Freq: Two times a day (BID) | ORAL | Status: DC
  Administered 2011-09-01 – 2011-09-07 (×14): 20 meq via ORAL
  Filled 2011-09-01 (×16): qty 1

## 2011-09-01 MED ORDER — ALPRAZOLAM 0.5 MG PO TABS
0.5000 mg | ORAL_TABLET | Freq: Three times a day (TID) | ORAL | Status: DC | PRN
  Administered 2011-09-01 – 2011-09-12 (×13): 0.5 mg via ORAL
  Filled 2011-09-01 (×13): qty 1

## 2011-09-01 MED ORDER — METOPROLOL TARTRATE 50 MG PO TABS
50.0000 mg | ORAL_TABLET | Freq: Two times a day (BID) | ORAL | Status: DC
  Administered 2011-09-01 – 2011-09-04 (×7): 50 mg via ORAL
  Filled 2011-09-01 (×9): qty 1

## 2011-09-01 MED ORDER — DIPHENHYDRAMINE HCL 25 MG PO CAPS
25.0000 mg | ORAL_CAPSULE | Freq: Four times a day (QID) | ORAL | Status: DC | PRN
  Administered 2011-09-02 – 2011-09-14 (×8): 25 mg via ORAL
  Filled 2011-09-01 (×8): qty 1
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
## 2011-09-02 ENCOUNTER — Inpatient Hospital Stay (HOSPITAL_COMMUNITY): Payer: Medicare Other

## 2011-09-02 LAB — GLUCOSE, CAPILLARY
Glucose-Capillary: 100 mg/dL — ABNORMAL HIGH (ref 70–99)
Glucose-Capillary: 104 mg/dL — ABNORMAL HIGH (ref 70–99)
Glucose-Capillary: 136 mg/dL — ABNORMAL HIGH (ref 70–99)
Glucose-Capillary: 97 mg/dL (ref 70–99)

## 2011-09-02 LAB — BASIC METABOLIC PANEL
BUN: 30 mg/dL — ABNORMAL HIGH (ref 6–23)
CO2: 28 mEq/L (ref 19–32)
Calcium: 8.4 mg/dL (ref 8.4–10.5)
Chloride: 103 mEq/L (ref 96–112)
Creatinine, Ser: 1.92 mg/dL — ABNORMAL HIGH (ref 0.50–1.35)
GFR calc Af Amer: 41 mL/min — ABNORMAL LOW (ref >90)
GFR calc non Af Amer: 35 mL/min — ABNORMAL LOW (ref >90)
Glucose, Bld: 109 mg/dL — ABNORMAL HIGH (ref 70–99)
Potassium: 3.3 mEq/L — ABNORMAL LOW (ref 3.5–5.1)
Sodium: 140 mEq/L (ref 135–145)

## 2011-09-02 LAB — CBC
HCT: 33.5 % — ABNORMAL LOW (ref 39.0–52.0)
Hemoglobin: 10.6 g/dL — ABNORMAL LOW (ref 13.0–17.0)
MCH: 24.9 pg — ABNORMAL LOW (ref 26.0–34.0)
MCHC: 31.6 g/dL (ref 30.0–36.0)
MCV: 78.8 fL (ref 78.0–100.0)
Platelets: 194 10*3/uL (ref 150–400)
RBC: 4.25 MIL/uL (ref 4.22–5.81)
RDW: 17.3 % — ABNORMAL HIGH (ref 11.5–15.5)
WBC: 12.3 10*3/uL — ABNORMAL HIGH (ref 4.0–10.5)

## 2011-09-02 MED ORDER — FUROSEMIDE 10 MG/ML IJ SOLN
40.0000 mg | Freq: Every day | INTRAMUSCULAR | Status: DC
  Administered 2011-09-03: 40 mg via INTRAVENOUS
  Filled 2011-09-02: qty 4

## 2011-09-02 MED ORDER — INSULIN ASPART 100 UNIT/ML ~~LOC~~ SOLN
0.0000 [IU] | Freq: Three times a day (TID) | SUBCUTANEOUS | Status: DC
  Administered 2011-09-02 – 2011-09-08 (×4): 1 [IU] via SUBCUTANEOUS

## 2011-09-02 MED ORDER — TAMSULOSIN HCL 0.4 MG PO CAPS
0.8000 mg | ORAL_CAPSULE | Freq: Every day | ORAL | Status: DC
  Administered 2011-09-02 – 2011-09-17 (×16): 0.8 mg via ORAL
  Filled 2011-09-02 (×17): qty 2

## 2011-09-02 MED ORDER — MORPHINE SULFATE 2 MG/ML IJ SOLN
2.0000 mg | INTRAMUSCULAR | Status: DC | PRN
  Administered 2011-09-02 – 2011-09-14 (×12): 2 mg via INTRAVENOUS
  Filled 2011-09-02 (×12): qty 1

## 2011-09-02 NOTE — Progress Notes (Signed)
4 Days Post-Op Procedure(s) (LRB): MEDIASTINAL EXPLORATION (N/A) Subjective: Had a hallucination this AM Walked already this AM  Objective: Vital signs in last 24 hours: Temp:  [97.6 F (36.4 C)-98 F (36.7 C)] 98 F (36.7 C) (01/21 0813) Pulse Rate:  [83-104] 96  (01/21 1000) Cardiac Rhythm:  [-] Normal sinus rhythm (01/21 1000) Resp:  [14-22] 15  (01/21 1000) BP: (112-159)/(71-94) 124/76 mmHg (01/21 1000) SpO2:  [86 %-100 %] 92 % (01/21 1000)  Hemodynamic parameters for last 24 hours:    Intake/Output from previous day: 01/20 0701 - 01/21 0700 In: 548 [P.O.:450; I.V.:90; IV Piggyback:8] Out: 3510 [Urine:3300; Chest Tube:210] Intake/Output this shift: Total I/O In: 250 [P.O.:240; I.V.:6; IV Piggyback:4] Out: 455 [Urine:425; Chest Tube:30]  General appearance: alert and no distress Neurologic: intact Heart: regular rate and rhythm Lungs: diminished breath sounds anterior - left Wound: clean and dry  Lab Results:  Basename 09/02/11 0440 09/01/11 0419  WBC 12.3* 14.1*  HGB 10.6* 10.5*  HCT 33.5* 33.1*  PLT 194 183   BMET:  Basename 09/02/11 0440 09/01/11 0419  NA 140 141  K 3.3* 3.6  CL 103 106  CO2 28 25  GLUCOSE 109* 125*  BUN 30* 29*  CREATININE 1.92* 1.73*  CALCIUM 8.4 8.9    PT/INR:  Basename 09/01/11 0419  LABPROT 20.0*  INR 1.67*   ABG    Component Value Date/Time   PHART 7.333* 08/29/2011 1218   HCO3 20.4 08/29/2011 1218   TCO2 21 08/30/2011 1757   ACIDBASEDEF 5.0* 08/29/2011 1218   O2SAT 98.0 08/29/2011 1218   CBG (last 3)   Basename 09/02/11 0810 09/02/11 0439 09/02/11 0020  GLUCAP 97 100* 104*    Assessment/Plan: S/P Procedure(s) (LRB): MEDIASTINAL EXPLORATION (N/A) - CV stable Resp: left CT d/c yesterday. Has small left pneumo and increased subcutaneous air, but is not in any distress. Did not have air leak for 3 days prior to tube removal.. Will follow for now. Renal: diuresed well the last couple of days. He's probably dry at  this point and creatinine up a little, will decrease lasix to once a day.  Urinary retention- foley replaced, restart flomax Psych: confusion and cooperation much improved but still has occasional "episode" CBGs have been well controlled, change to [facility] and HS CBG/ SSI   LOS: 31 days    HENDRICKSON,STEVEN C 09/02/2011

## 2011-09-02 NOTE — Progress Notes (Signed)
  Patient ID: Warren [name], male   DOB: 03/22/1946, 66 y.o.   MRN: [id number]  Filed Vitals:   09/02/11 1800 09/02/11 1900 09/02/11 1906 09/02/11 2000  BP: 121/76 119/79  127/76  Pulse: 103 94  102  Temp:   98 F (36.7 C)   TempSrc:   Oral   Resp: 15 14  13   Height:      Weight:      SpO2: 97% 97%  91%   Stable day.  Ambulated 3 times.  Resting comfortably.

## 2011-09-02 NOTE — Progress Notes (Signed)
Pt has been unable to void much since 1930 despite frequent attempts to get up to [facility], go to bathroom, and use urinal in bed. Pt complains of discomfort at tip of penis. The urine he has voided (<100 ml) is clear, yellow. Inspection of penis is WNL other than some swelling; no blood or sore. Bladder scan done showing in bladder. Another attempt to urinate unsuccessful. Dr. Pearletha Alfred called at 0130 and order received to replace Foley catheter.

## 2011-09-02 NOTE — Progress Notes (Signed)
Physical Therapy Treatment Patient Details Name: LUCIUS WISE MRN: [id number] DOB: 1945-12-06 Today's Date: 09/02/2011  PT Assessment/Plan  PT - Assessment/Plan Comments on Treatment Session: pt presents with AVR, MV repair.  pt deconditioned and needs encouragement for increased activity.   PT Plan: Discharge plan remains appropriate;Frequency remains appropriate PT Frequency: Min 3X/week Recommendations for Other Services: Rehab consult Follow Up Recommendations: Inpatient Rehab Equipment Recommended: Defer to next venue PT Goals  Acute Rehab PT Goals PT Goal: Rolling Supine to Right Side - Progress: Not met PT Goal: Rolling Supine to Left Side - Progress: Not met PT Goal: Supine/Side to Sit - Progress: Not met PT Goal: Sit at Edge Of Bed - Progress: Not met PT Goal: Sit to Supine/Side - Progress: Not met PT Goal: Sit to Stand - Progress: Progressing toward goal PT Goal: Stand to Sit - Progress: Progressing toward goal PT Goal: Stand - Progress: Progressing toward goal PT Goal: Ambulate - Progress: Progressing toward goal  PT Treatment Precautions/Restrictions  Precautions Precautions: Fall Precaution Comments: R sided chest tube Restrictions Weight Bearing Restrictions: No Mobility (including Balance) Bed Mobility Bed Mobility: No Transfers Transfers: Yes Sit to Stand: 3: Mod assist;From chair/3-in-1;With upper extremity assist Sit to Stand Details (indicate cue type and reason): cues for hands to knees and anterior wt shift Stand to Sit: 4: Min assist;With upper extremity assist;To chair/3-in-1 Stand to Sit Details: Again cues for hands to knees and to control descent Ambulation/Gait Ambulation/Gait: Yes Ambulation/Gait Assistance: 4: Min assist Ambulation/Gait Assistance Details (indicate cue type and reason): cues for upright posture, pursed lip breathing, encouragement of increased distance Ambulation Distance (Feet): 200 Feet Assistive device:  (W/C to A with  carrying O2, chest tube, Heart pillow...) Gait Pattern: Decreased stride length;Step-through pattern;Trunk flexed Stairs: No Wheelchair Mobility Wheelchair Mobility: No  Posture/Postural Control Posture/Postural Control: No significant limitations Exercise    End of Session PT - End of Session Equipment Utilized During Treatment: Gait belt Activity Tolerance: Patient tolerated treatment well;Patient limited by fatigue Patient left: in chair;with call bell in reach Psychiatrist) Nurse Communication: Mobility status for transfers;Mobility status for ambulation General Behavior During Session: [facility] for tasks performed Cognition: Impaired  Sunny Schlein, [place] [phone] 09/02/2011, 9:13 AM

## 2011-09-03 ENCOUNTER — Encounter (HOSPITAL_COMMUNITY): Payer: Self-pay | Admitting: Thoracic Surgery (Cardiothoracic Vascular Surgery)

## 2011-09-03 ENCOUNTER — Inpatient Hospital Stay (HOSPITAL_COMMUNITY): Payer: Medicare Other

## 2011-09-03 DIAGNOSIS — R5381 Other malaise: Secondary | ICD-10-CM

## 2011-09-03 DIAGNOSIS — I080 Rheumatic disorders of both mitral and aortic valves: Secondary | ICD-10-CM

## 2011-09-03 LAB — CBC
HCT: 30.8 % — ABNORMAL LOW (ref 39.0–52.0)
Hemoglobin: 9.8 g/dL — ABNORMAL LOW (ref 13.0–17.0)
MCH: 24.7 pg — ABNORMAL LOW (ref 26.0–34.0)
MCHC: 31.8 g/dL (ref 30.0–36.0)
MCV: 77.8 fL — ABNORMAL LOW (ref 78.0–100.0)

## 2011-09-03 LAB — BASIC METABOLIC PANEL
BUN: 27 mg/dL — ABNORMAL HIGH (ref 6–23)
Creatinine, Ser: 1.79 mg/dL — ABNORMAL HIGH (ref 0.50–1.35)
GFR calc non Af Amer: 38 mL/min — ABNORMAL LOW (ref >90)
Glucose, Bld: 106 mg/dL — ABNORMAL HIGH (ref 70–99)
Potassium: 3.2 mEq/L — ABNORMAL LOW (ref 3.5–5.1)

## 2011-09-03 LAB — GLUCOSE, CAPILLARY
Glucose-Capillary: 99 mg/dL (ref 70–99)
Glucose-Capillary: 105 mg/dL — ABNORMAL HIGH (ref 70–99)

## 2011-09-03 MED ORDER — FUROSEMIDE 40 MG PO TABS
40.0000 mg | ORAL_TABLET | Freq: Every day | ORAL | Status: DC
  Administered 2011-09-04 – 2011-09-07 (×4): 40 mg via ORAL
  Filled 2011-09-03 (×5): qty 1

## 2011-09-03 NOTE — Progress Notes (Signed)
CSW continues to follow acutely to assist with d/c planning, as needed.    Baxter Flattery, MSW [phone]
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
## 2011-09-03 NOTE — Progress Notes (Signed)
Patient ID: Warren [name], male   DOB: 09-11-1945, 66 y.o.   MRN: [id number] [facility] DAILY PROGRESS NOTE                   [address]            [place] [address]          [phone]      5 Days Post-Op Procedure(s) (LRB): MEDIASTINAL EXPLORATION (N/A) Redo AVR  Total Length of Stay:  LOS: 32 days   Subjective: Alert and cooperative  Objective: Vital signs in last 24 hours: Temp:  [97.6 F (36.4 C)-98.5 F (36.9 C)] 97.7 F (36.5 C) (01/22 1135) Pulse Rate:  [88-119] 100  (01/22 1200) Cardiac Rhythm:  [-] Normal sinus rhythm (01/22 1200) Resp:  [12-19] 16  (01/22 1200) BP: (117-141)/(72-100) 126/83 mmHg (01/22 1200) SpO2:  [85 %-99 %] 98 % (01/22 1200) Weight:  [192 lb 14.4 oz (87.5 kg)] 192 lb 14.4 oz (87.5 kg) (01/22 0300)  Filed Weights   08/31/11 0600 09/01/11 0600 09/03/11 0300  Weight: 198 lb 3.1 oz (89.9 kg) 196 lb 3.4 oz (89 kg) 192 lb 14.4 oz (87.5 kg)    Weight change:    Hemodynamic parameters for last 24 hours:    Intake/Output from previous day: 01/21 0701 - 01/22 0700 In: 1320 [P.O.:1310; I.V.:6; IV Piggyback:4] Out: 2280 [Urine:2205; Chest Tube:75]  Intake/Output this shift: Total I/O In: 165 [P.O.:150; I.V.:15] Out: 1970 [Urine:1950; Chest Tube:20]  Current Meds: Scheduled Meds:   . acetaminophen  1,000 mg Oral Q6H   Or  . acetaminophen (TYLENOL) oral liquid 160 mg/5 mL  975 mg Per Tube Q6H  . aspirin EC  325 mg Oral Daily   Or  . aspirin  324 mg Per Tube Daily  . bisacodyl  10 mg Oral Daily   Or  . bisacodyl  10 mg Rectal Daily  . docusate sodium  200 mg Oral Daily  . furosemide  40 mg Oral Daily  . insulin aspart  0-9 Units Subcutaneous TID WC  . magnesium sulfate  4 g Intravenous Once  . metoprolol tartrate  50 mg Oral BID  . pantoprazole  40 mg Oral Q1200  . potassium chloride  20 mEq Oral BID  . sodium chloride  3 mL Intravenous Q12H  . Tamsulosin HCl  0.8 mg Oral QPC breakfast  . DISCONTD: furosemide  40  mg Intravenous Daily   Continuous Infusions:   . sodium chloride Stopped (08/29/11 1030)  . sodium chloride 20 mL/hr at 09/01/11 0141  . sodium chloride 250 mL (08/31/11 1219)  . lactated ringers Stopped (08/29/11 1030)   PRN Meds:.ALPRAZolam, diphenhydrAMINE, metoprolol, morphine injection, ondansetron (ZOFRAN) IV, oxyCODONE, sodium chloride  General appearance: alert, cooperative and sub q air left neck greater then rt Neurologic: intact Heart: regular rate and rhythm, S1, S2 normal, no murmur, click, rub or gallop Lungs: diminished breath sounds bibasilar Abdomen: soft, non-tender; bowel sounds normal; no masses,  no organomegaly  Lab Results: CBC: Basename 09/03/11 0422 09/02/11 0440  WBC 9.3 12.3*  HGB 9.8* 10.6*  HCT 30.8* 33.5*  PLT 195 194   BMET:  Basename 09/03/11 0422 09/02/11 0440  NA 137 140  K 3.2* 3.3*  CL 100 103  CO2 28 28  GLUCOSE 106* 109*  BUN 27* 30*  CREATININE 1.79* 1.92*  CALCIUM 8.2* 8.4    PT/INR:  Basename 09/01/11 0419  LABPROT 20.0*  INR 1.67*   Radiology: Dg  Chest Port 1 View  09/03/2011  *RADIOLOGY REPORT*  Clinical Data: Postoperative study from recent open-heart surgery.  PORTABLE CHEST - 1 VIEW  Comparison: Multiple priors, most recently 09/02/2011.  Findings: Small-bore right-sided thoracostomy tube with tip projecting over the apex of the right hemithorax.  Median sternotomy wires and midline surgical staples again noted. Mitral annuloplasty ring.  Lung volumes are low, and there are increasing bibasilar opacities. Vascular crowding with indistinctness of the interstitial markings. No definite pneumothorax.  Size of the cardiopericardial silhouette has slightly increased compared to recent prior examination, which may in part be positional. The patient is rotated to the left on today's exam, resulting in distortion of the mediastinal contours and reduced diagnostic sensitivity and specificity for mediastinal pathology.  Marland Kitchen  Extensive  subcutaneous emphysema is seen throughout the chest wall bilaterally tracking cephalad into the cervical regions of the neck bilaterally.  IMPRESSION:  1.  Support apparatus and postsurgical changes, as above. 2.  No definite pneumothorax. 3.  Increasing subcutaneous emphysema throughout the chest wall bilaterally and cervical regions. 4.  Worsening aeration throughout the lung bases bilaterally, likely reflect a combination of atelectasis and increasing moderate bilateral pleural effusions.  Strictly speaking, airspace consolidation from aspiration or developing infection would be difficult to exclude in the lower lobes of the lungs. 5.  Slight interval enlargement of cardiopericardial silhouette. Whether not this reflects interval accumulation of pericardial fluid, or dilatation of the heart is uncertain.  Original Report Authenticated By: Florencia Reasons, M.D.   Dg Chest Port 1v Same Day  09/02/2011  *RADIOLOGY REPORT*  Clinical Data: Postop heart surgery.  PORTABLE CHEST - 1 VIEW SAME DAY  Comparison: Multiple recent previous exams.  Findings: 0620 hours. The cardiopericardial silhouette is enlarged. Left chest tube has been removed in the interval with a small residual left pneumothorax.  Right chest tube remains in place with small right pneumothorax also visible. Small, right greater than left, pleural effusions persist.  There is some basilar atelectasis.  The right IJ sheath has been pulled.  There is diffuse subcutaneous emphysema bilaterally.  IMPRESSION: Interval removal of left chest tube with small residual left pneumothorax.  Right chest tube remains in place with pneumothorax visible.  Cardiomegaly with bibasilar atelectasis and small right effusion.  Original Report Authenticated By: ERIC A. MANSELL, M.D.     Assessment/Plan: S/P Procedure(s) (LRB): MEDIASTINAL EXPLORATION (N/A) REDO AVR Back on flomax for two days, try foley removal again Still sbq air in neck, slighly greater on the  left then yesterday- will return rt ct back to suction CR back to baseline 1.7    [name] MD  Beeper [phone] Office [phone] 09/03/2011 2:09 PM

## 2011-09-04 ENCOUNTER — Inpatient Hospital Stay (HOSPITAL_COMMUNITY): Payer: Medicare Other

## 2011-09-04 LAB — CBC
HCT: 33 % — ABNORMAL LOW (ref 39.0–52.0)
Hemoglobin: 10.5 g/dL — ABNORMAL LOW (ref 13.0–17.0)
MCH: 24.8 pg — ABNORMAL LOW (ref 26.0–34.0)
MCHC: 31.8 g/dL (ref 30.0–36.0)
MCV: 78 fL (ref 78.0–100.0)
Platelets: 238 10*3/uL (ref 150–400)
RBC: 4.23 MIL/uL (ref 4.22–5.81)
RDW: 17.1 % — ABNORMAL HIGH (ref 11.5–15.5)
WBC: 9 10*3/uL (ref 4.0–10.5)

## 2011-09-04 LAB — BASIC METABOLIC PANEL
BUN: 22 mg/dL (ref 6–23)
CO2: 30 mEq/L (ref 19–32)
Calcium: 8.7 mg/dL (ref 8.4–10.5)
Chloride: 99 mEq/L (ref 96–112)
Creatinine, Ser: 1.45 mg/dL — ABNORMAL HIGH (ref 0.50–1.35)
GFR calc Af Amer: 57 mL/min — ABNORMAL LOW (ref >90)
GFR calc non Af Amer: 49 mL/min — ABNORMAL LOW (ref >90)
Glucose, Bld: 110 mg/dL — ABNORMAL HIGH (ref 70–99)
Potassium: 3.7 mEq/L (ref 3.5–5.1)
Sodium: 136 mEq/L (ref 135–145)

## 2011-09-04 LAB — GLUCOSE, CAPILLARY
Glucose-Capillary: 107 mg/dL — ABNORMAL HIGH (ref 70–99)
Glucose-Capillary: 107 mg/dL — ABNORMAL HIGH (ref 70–99)

## 2011-09-04 MED ORDER — SODIUM CHLORIDE 0.9 % IJ SOLN
3.0000 mL | Freq: Two times a day (BID) | INTRAMUSCULAR | Status: DC
  Administered 2011-09-04 – 2011-09-16 (×22): 3 mL via INTRAVENOUS

## 2011-09-04 MED ORDER — SODIUM CHLORIDE 0.9 % IV SOLN: 250.0000 mL | INTRAVENOUS | Status: DC | PRN

## 2011-09-04 MED ORDER — MOVING RIGHT ALONG BOOK
Freq: Once | Status: AC
  Administered 2011-09-05: 13:00:00
  Filled 2011-09-04: qty 1

## 2011-09-04 MED ORDER — SODIUM CHLORIDE 0.9 % IJ SOLN
3.0000 mL | INTRAMUSCULAR | Status: DC | PRN
  Administered 2011-09-07 – 2011-09-15 (×3): 3 mL via INTRAVENOUS

## 2011-09-04 NOTE — Progress Notes (Signed)
Physical Therapy Treatment Patient Details Name: Warren [name] MRN: [id number] DOB: 1946/05/08 Today's Date: 09/04/2011  PT Assessment/Plan  PT - Assessment/Plan Comments on Treatment Session: Doing well. Reports he can have initial 24 hour assist when he eventually goes home.  PT Plan: Discharge plan needs to be updated Follow Up Recommendations: Supervision/Assistance - 24 hour;Home health PT Equipment Recommended: None recommended by PT PT Goals  Acute Rehab PT Goals PT Goal: Rolling Supine to Right Side - Progress:  (not addressed today) Pt will Roll Supine to Left Side:  (not addressed today) PT Goal: Rolling Supine to Left Side - Progress: Not met (not addressed today) PT Goal: Supine/Side to Sit - Progress: Not met (not addressed) PT Goal: Sit at Edge Of Bed - Progress: Met PT Goal: Sit to Supine/Side - Progress: Not met (not addressed) PT Goal: Sit to Stand - Progress: Progressing toward goal PT Goal: Stand to Sit - Progress: Progressing toward goal PT Goal: Stand - Progress: Progressing toward goal PT Goal: Ambulate - Progress: Progressing toward goal  PT Treatment Precautions/Restrictions  Precautions Precautions: Fall Precaution Comments: R sided chest tube Restrictions Weight Bearing Restrictions: No Mobility (including Balance) Bed Mobility Bed Mobility: No (pt sitting EOB on my arrival) Transfers Sit to Stand: 4: Min assist;From bed;Without upper extremity assist Sit to Stand Details (indicate cue type and reason): minA cueing for safe hand in lap placement and follow through to stand Stand to Sit: 4: Min assist;To chair/3-in-1;Without upper extremity assist Ambulation/Gait Ambulation/Gait Assistance: 4: Min assist Ambulation/Gait Assistance Details (indicate cue type and reason): amb. approx 320 ft with RW; min cueing for upright posture, to relax shoulders and increase forward gaze; amb with decreased stride length and heavy reliance on RW Ambulation  Distance (Feet): 320 Feet Assistive device: Rolling walker Gait Pattern: Trunk flexed Stairs: No  Posture/Postural Control Posture/Postural Control: No significant limitations Static Sitting Balance Static Sitting - Balance Support: No upper extremity supported Static Sitting - Level of Assistance: 5: Stand by assistance Exercise  Total Joint Exercises Ankle Circles/Pumps: AROM;Both;10 reps End of Session PT - End of Session Equipment Utilized During Treatment: Gait belt Activity Tolerance: Patient tolerated treatment well;Patient limited by fatigue;Treatment limited secondary to medical complications (Comment) (nausea) Patient left: in chair;with call bell in reach Nurse Communication: Mobility status for transfers;Mobility status for ambulation General Behavior During Session: [facility] for tasks performed Cognition: [facility] for tasks performed  [facility] [name] 09/04/2011, 3:29 PM
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
## 2011-09-04 NOTE — Progress Notes (Signed)
Rehab admissions - Evaluated for possible admission.  I spoke with patient.  He does not want to consider SNF.  I think he may progress to go home with [facility].  He continues with a chest tube.  Will follow along for progress.  Pager [phone]

## 2011-09-04 NOTE — Progress Notes (Signed)
   CARE MANAGEMENT NOTE 09/04/2011  Patient:  Warren [name], Warren [name]   Account Number:  [id number]  Date Initiated:  08/29/2011  Documentation initiated by:  [facility]  Subjective/Objective Assessment:   post op 08-28-11 - AVR and AVR.     Action/Plan:   PTA, PT LIVES ALONE AND IS INDEPENDENT OF ADLS.   Anticipated DC Date:  09/06/2011   Anticipated DC Plan:  IP REHAB FACILITY  In-house referral  Clinical Social Worker      DC Planning Services  CM consult      Choice offered to / List presented to:             Status of service:  In process, will continue to follow Medicare Important Message given?   (If response is "NO", the following Medicare IM given date fields will be blank) Date Medicare IM given:   Date Additional Medicare IM given:    Discharge Disposition:    Per UR Regulation:  Reviewed for med. necessity/level of care/duration of stay  Comments:  09/04/11 Dhaval Woo,RN,BSN 1530 PATIENT HAS BEEN EVALUATED FOR INPT REHAB, BUT HAS NOW PROGRESSED TO WHERE HE CAN LIKELY GO HOME WITH HOME HEALTH CARE.  HE LIVES ALONE, BUT STATES HE HAS FRIENDS THAT CAN PROVIDE 24HR CARE AT DISCHARGE.  HE STATES THAT HE WILL CONFIRM THAT FRIENDS CAN STAY WITH HIM FOR 24H/DAY X 7-10 DAYS AT DC.  HE IS AGREEABLE TO HOME HEALTH FOLLOW UP, IF NEEDED, AND STATES HE HAS ALL NEEDED EQUIPMENT AT HOME. WILL FOLLOW . Phone #[phone]    09-03-11 10:50am Avie Arenas, RNBSN - [phone] UR Completed.  08-29-11 11:35am Avie Arenas,  RNBSN [phone] UR Completed.

## 2011-09-04 NOTE — Progress Notes (Signed)
   CARDIOTHORACIC SURGERY PROGRESS NOTE   R6 Days Post-Op Procedure(s) (LRB): MEDIASTINAL EXPLORATION (N/A) Redo AVR  Subjective: Feels well. Breathing comfortably on room air.  Ambulating fairly well. Eating well.  Objective: Vital signs: BP Readings from Last 1 Encounters:  09/04/11 134/94   Pulse Readings from Last 1 Encounters:  09/04/11 110   Resp Readings from Last 1 Encounters:  09/04/11 16   Temp Readings from Last 1 Encounters:  09/04/11 98.8 F (37.1 C) Oral    Hemodynamics:    Physical Exam:  Rhythm:   Sinus tach  Breath sounds: Clear, sub Q air reportedly decreased  Heart sounds:  RRR  Incisions:  Clean and dry  Abdomen:  soft  Extremities:  Warm  Chest tube:  No air leak   Intake/Output from previous day: 01/22 0701 - 01/23 0700 In: 1340 [P.O.:1325; I.V.:15] Out: 3690 [Urine:3650; Chest Tube:40] Intake/Output this shift: Total I/O In: 100 [P.O.:100] Out: 110 [Urine:100; Chest Tube:10]  Lab Results:  Basename 09/04/11 0425 09/03/11 0422  WBC 9.0 9.3  HGB 10.5* 9.8*  HCT 33.0* 30.8*  PLT 238 195   BMET:  Basename 09/04/11 0425 09/03/11 0422  NA 136 137  K 3.7 3.2*  CL 99 100  CO2 30 28  GLUCOSE 110* 106*  BUN 22 27*  CREATININE 1.45* 1.79*  CALCIUM 8.7 8.2*    CBG (last 3)   Basename 09/04/11 0732 09/03/11 1847 09/03/11 1157  GLUCAP 107* 126* 105*   ABG    Component Value Date/Time   PHART 7.333* 08/29/2011 1218   HCO3 20.4 08/29/2011 1218   TCO2 21 08/30/2011 1757   ACIDBASEDEF 5.0* 08/29/2011 1218   O2SAT 98.0 08/29/2011 1218   CXR: stable  Assessment/Plan: S/P Procedure(s) (LRB): MEDIASTINAL EXPLORATION (N/A)  Progressing well. Will transfer to step down Keep chest tube to suction for now  [facility] H 09/04/2011 9:24 AM

## 2011-09-05 ENCOUNTER — Encounter (HOSPITAL_COMMUNITY): Payer: Self-pay | Admitting: Dietician

## 2011-09-05 ENCOUNTER — Inpatient Hospital Stay (HOSPITAL_COMMUNITY): Payer: Medicare Other

## 2011-09-05 LAB — CBC
MCH: 24.8 pg — ABNORMAL LOW (ref 26.0–34.0)
MCV: 78.6 fL (ref 78.0–100.0)
Platelets: 207 10*3/uL (ref 150–400)
RDW: 17.3 % — ABNORMAL HIGH (ref 11.5–15.5)

## 2011-09-05 LAB — GLUCOSE, CAPILLARY
Glucose-Capillary: 96 mg/dL (ref 70–99)
Glucose-Capillary: 112 mg/dL — ABNORMAL HIGH (ref 70–99)
Glucose-Capillary: 120 mg/dL — ABNORMAL HIGH (ref 70–99)

## 2011-09-05 LAB — BASIC METABOLIC PANEL
Calcium: 8.7 mg/dL (ref 8.4–10.5)
Creatinine, Ser: 1.38 mg/dL — ABNORMAL HIGH (ref 0.50–1.35)
GFR calc Af Amer: 60 mL/min — ABNORMAL LOW (ref >90)
GFR calc non Af Amer: 52 mL/min — ABNORMAL LOW (ref >90)

## 2011-09-05 MED ORDER — GLUCERNA SHAKE PO LIQD
237.0000 mL | Freq: Two times a day (BID) | ORAL | Status: DC
  Administered 2011-09-05 – 2011-09-11 (×2): 237 mL via ORAL
  Filled 2011-09-05: qty 237

## 2011-09-05 MED ORDER — METOPROLOL TARTRATE 50 MG PO TABS
75.0000 mg | ORAL_TABLET | Freq: Two times a day (BID) | ORAL | Status: DC
  Administered 2011-09-05 – 2011-09-17 (×23): 75 mg via ORAL
  Filled 2011-09-05 (×27): qty 1

## 2011-09-05 NOTE — Progress Notes (Signed)
Pt ambulated in hallway 177ft, with wheelchair, oxygen and one person assist.  Pt tolerated well, but had some complaints of soreness in chest from insicion. Pt. O2 sats dropped down to 83% while on 2L getting back into the bed after ambulation. Pt took deep breaths, and only came up to 85%. Pt bumped up to 3L, and after catching his breath, O2 sats was 98%. O2 bumped back down to 2L and was 95%. Will continue to monitor.

## 2011-09-05 NOTE — Progress Notes (Signed)
UR Completed.  Kolette Vey Jane [phone] 09/05/2011  

## 2011-09-05 NOTE — Progress Notes (Signed)
Pt RA sat 86% placed back on O2 at 2 L o2 sat 92% will monitior patient .  Eero Dini, Randall An RN

## 2011-09-05 NOTE — Progress Notes (Signed)
7 Days Post-Op Procedure(s) (LRB): MEDIASTINAL EXPLORATION (N/A) Subjective: C/o intermittent sharp pains in chest Feels subcutaneous air is about the same  Objective: Vital signs in last 24 hours: Temp:  [96.9 F (36.1 C)-97.9 F (36.6 C)] 96.9 F (36.1 C) (01/24 0446) Pulse Rate:  [102-127] 110  (01/24 0446) Cardiac Rhythm:  [-] Sinus tachycardia (01/23 1930) Resp:  [13-22] 18  (01/24 0446) BP: (117-149)/(59-98) 149/98 mmHg (01/24 0446) SpO2:  [90 %-100 %] 97 % (01/24 0500) Weight:  [84.1 kg (185 lb 6.5 oz)] 84.1 kg (185 lb 6.5 oz) (01/24 0446)  Hemodynamic parameters for last 24 hours:    Intake/Output from previous day: 01/23 0701 - 01/24 0700 In: 253 [P.O.:250; I.V.:3] Out: 2055 [Urine:2025; Chest Tube:30] Intake/Output this shift:    General appearance: alert and no distress Heart: tachy, + rub Lungs: rhonchi bilaterally Wound: clean and dry Subcutaneous air stable to slightly decreased No air leak  Lab Results:  Basename 09/05/11 0530 09/04/11 0425  WBC 5.6 9.0  HGB 9.5* 10.5*  HCT 30.1* 33.0*  PLT 207 238   BMET:  Basename 09/05/11 0530 09/04/11 0425  NA 138 136  K 3.8 3.7  CL 99 99  CO2 32 30  GLUCOSE 103* 110*  BUN 20 22  CREATININE 1.38* 1.45*  CALCIUM 8.7 8.7    PT/INR: No results found for this basename: LABPROT,INR in the last 72 hours ABG    Component Value Date/Time   PHART 7.333* 08/29/2011 1218   HCO3 20.4 08/29/2011 1218   TCO2 21 08/30/2011 1757   ACIDBASEDEF 5.0* 08/29/2011 1218   O2SAT 98.0 08/29/2011 1218   CBG (last 3)   Basename 09/05/11 0615 09/04/11 2047 09/04/11 1514  GLUCAP 96 106* 104*    Assessment/Plan: S/P Procedure(s) (LRB): MEDIASTINAL EXPLORATION (N/A) -CV sinus tach with PACs and PVCs - increase lopressor to 75 BID Tissue AVR- avoid coumadin due to compliance concerns Resp- bilateral pneumothorax with subQ emphysema- no air leak, will place CT to water seal, will keep tube in until SQ air definitely  resolving Renal- creatinine continues to improve, lytes OK, watch volume status Delirium- resolving   LOS: 34 days    Leray Garverick C 09/05/2011

## 2011-09-05 NOTE — Progress Notes (Signed)
Pt ambulated approximately 150 feet with walker assist x2, with 02 at 2l. Pt tolerated well, heart rate 97 on monitor. Will monitor patient. Shayleigh Bouldin, Randall An RN

## 2011-09-05 NOTE — Progress Notes (Signed)
Nutrition Follow-up  Diet Order:  Carb Mod PO intake: 25-75% documented, many meals have not been documented. RD attempted to speak with patient this morning but patient said he wasn't feeling to well and asked not to be bothered.  Patient was previously taking Resource Breeze TID, these were discontinued on 1/17.   Meds: Scheduled Meds:   . aspirin EC  325 mg Oral Daily  . bisacodyl  10 mg Oral Daily  . furosemide  40 mg Oral Daily  . insulin aspart  0-9 Units Subcutaneous TID WC  . metoprolol tartrate  75 mg Oral BID  . moving right along book   Does not apply Once  . pantoprazole  40 mg Oral Q1200  . potassium chloride  20 mEq Oral BID  . sodium chloride  3 mL Intravenous Q12H  . Tamsulosin HCl  0.8 mg Oral QPC breakfast  . DISCONTD: aspirin  324 mg Per Tube Daily  . DISCONTD: bisacodyl  10 mg Rectal Daily  . DISCONTD: docusate sodium  200 mg Oral Daily  . DISCONTD: magnesium sulfate  4 g Intravenous Once  . DISCONTD: metoprolol tartrate  50 mg Oral BID  . DISCONTD: sodium chloride  3 mL Intravenous Q12H   Continuous Infusions:   . DISCONTD: sodium chloride Stopped (08/29/11 1030)  . DISCONTD: sodium chloride 20 mL/hr at 09/01/11 0141  . DISCONTD: sodium chloride 250 mL (09/04/11 1526)  . DISCONTD: lactated ringers Stopped (08/29/11 1030)   PRN Meds:.sodium chloride, ALPRAZolam, diphenhydrAMINE, morphine injection, ondansetron (ZOFRAN) IV, oxyCODONE, sodium chloride, DISCONTD: metoprolol, DISCONTD: sodium chloride  Labs:  CMP     Component Value Date/Time   NA 138 09/05/2011 0530   K 3.8 09/05/2011 0530   CL 99 09/05/2011 0530   CO2 32 09/05/2011 0530   GLUCOSE 103* 09/05/2011 0530   BUN 20 09/05/2011 0530   CREATININE 1.38* 09/05/2011 0530   CREATININE 2.19* 06/26/2008 2130   CALCIUM 8.7 09/05/2011 0530   PROT 5.4* 08/27/2011 1503   ALBUMIN 3.1* 08/28/2011 0645   AST 11 08/27/2011 1503   ALT 9 08/27/2011 1503   ALKPHOS 102 08/27/2011 1503   BILITOT 0.8 08/27/2011 1503   GFRNONAA 52* 09/05/2011 0530   GFRAA 60* 09/05/2011 0530   CBG (last 3)   Basename 09/05/11 0615 09/04/11 2047 09/04/11 1514  GLUCAP 96 106* 104*      Intake/Output Summary (Last 24 hours) at 09/05/11 1125 Last data filed at 09/05/11 1031  Gross per 24 hour  Intake    106 ml  Output   1645 ml  Net  -1539 ml    Weight Status:  185 lbs, weight has been trending down for the last 6 days. Weight is still above admission weight.   Re-estimated needs:  2000-2200 kcal, 100-120 gm protein  Nutrition Dx:  Inadequate oral intake, ongoing  Goal:  Meet > 90 % of estimated nutrition needs, currently unmet  Intervention:   1. RD will add Glucerna BID to promote additional calorie intake while po intake is poor.   Monitor:  PO intake, weight, labs, I/O's   Rudean Haskell Pager #:  [phone]

## 2011-09-05 NOTE — Plan of Care (Signed)
Problem: Phase III Progression Outcomes Goal: Time patient transferred to PCTU/Telemetry POD Outcome: Completed/Met Date Met:  09/05/11 09/04/11

## 2011-09-05 NOTE — Progress Notes (Signed)
After ambulating pt in chair x2 today.  Will monitor patient Pavan Bring, Randall An RN

## 2011-09-05 NOTE — Progress Notes (Signed)
CARDIAC REHAB PHASE I   PRE:  Rate/Rhythm: 95SR  BP:  Supine: 119/79  Sitting:   Standing:    SaO2: 96%2L  MODE:  Ambulation: 250 ft   POST:  Rate/Rhythem: 108  BP:  Supine:   Sitting: 114/76  Standing:    SaO2: 96%2L hall and room 1126-1200 Pt got more pain med prior to walk. Pt walked 250 ft on O2 at 2L with rolling walker and asst x 2. Had to sit in chair in hallway to rest. C/o legs weak. Checked O2 sats and 96%2L.  C/o sternal pain. To recliner with call bell. Encouraged activity with staff.  Duanne Limerick

## 2011-09-05 NOTE — Progress Notes (Signed)
Rehab admissions - Making great progress.  I expect he will be able to go home with [facility] therapies when medically ready.  Pager [phone]

## 2011-09-06 ENCOUNTER — Inpatient Hospital Stay (HOSPITAL_COMMUNITY): Payer: Medicare Other

## 2011-09-06 LAB — GLUCOSE, CAPILLARY: Glucose-Capillary: 105 mg/dL — ABNORMAL HIGH (ref 70–99)

## 2011-09-06 NOTE — Progress Notes (Addendum)
                    [address]            [place] [address]          [phone]     8 Days Post-Op Procedure(s) (LRB): MEDIASTINAL EXPLORATION (N/A)  Subjective: Pain is better controlled today.  No new complaints.  Objective: Vital signs in last 24 hours: Patient Vitals for the past 24 hrs:  BP Temp Temp src Pulse Resp SpO2 Weight  09/06/11 0351 122/83 mmHg 98.3 F (36.8 C) Oral 104  18  96 % 83.1 kg (183 lb 3.2 oz)  09/05/11 2100 123/86 mmHg 97.6 F (36.4 C) Oral 101  18  95 % -  09/05/11 1325 116/76 mmHg 97.7 F (36.5 C) Oral 80  18  93 % -  09/05/11 1033 - - - - - 92 % -  09/05/11 1031 128/83 mmHg - - - - 86 % -   Current Weight  09/06/11 83.1 kg (183 lb 3.2 oz)     Intake/Output from previous day: 01/24 0701 - 01/25 0700 In: 1683 [P.O.:1680; I.V.:3] Out: 2305 [Urine:2250; Chest Tube:55]  CBGs 120-104-105  PHYSICAL EXAM:  Heart: RRR 100s, +rub Lungs: some coarse BS Wound: clean and dry, chest and neck with + SQ air Extremities: mild LE edema CT: no air leak  Lab Results: CBC: Basename 09/05/11 0530 09/04/11 0425  WBC 5.6 9.0  HGB 9.5* 10.5*  HCT 30.1* 33.0*  PLT 207 238   BMET:  Basename 09/05/11 0530 09/04/11 0425  NA 138 136  K 3.8 3.7  CL 99 99  CO2 32 30  GLUCOSE 103* 110*  BUN 20 22  CREATININE 1.38* 1.45*  CALCIUM 8.7 8.7    CXR- stable SQ air, small basilar effusions/atx  Assessment/Plan: S/P Procedure(s) (LRB): MEDIASTINAL EXPLORATION (N/A), AVR/MV repair CV- HRs better with increased beta blocker, BPs stable. SQ air- continue CT for now.  Pulm- wean O2 as tolerated. ARI- Cr trending down.  Monitor. CPRI   LOS: 35 days    COLLINS,GINA H 09/06/2011    Chart reviewed, patient examined, agree with above. No air leak from chest tube.  Will dc tube when subcutaneous air resolving.

## 2011-09-06 NOTE — Progress Notes (Signed)
Pt with continual pain today and [name]  aware of patients pain this am.  Pt with Chest tube to Water seal. Patients c/o of pain pointing/ directed to incisions on chest. Pain medication given as ordered for patient. Will continue to monitor patient. Savien Mamula, [employer]

## 2011-09-06 NOTE — Progress Notes (Addendum)
Patient Warren [name] Approximately 250 feet with walker and one person assist. Pt tolerated well. Patient back in chair call bell with in reach. Will monitor patient. Ezella Kell, Randall An RN

## 2011-09-06 NOTE — Progress Notes (Signed)
Physical Therapy Treatment Patient Details Name: Warren [name] MRN: [id number] DOB: 25-Feb-1946 Today's Date: 09/06/2011  PT Assessment/Plan  PT - Assessment/Plan Comments on Treatment Session: Did well today. Impulsive and not all too receptive to cueing for safe technique/sternal precautions.  PT Plan: Discharge plan remains appropriate PT Goals  Acute Rehab PT Goals PT Goal: Rolling Supine to Left Side - Progress:  (not addressed) Pt will go Supine/Side to Sit: with modified independence (with HOB flat and no rails) PT Goal: Supine/Side to Sit - Progress: Updated due to goal met Pt will go Sit to Stand: with modified independence;without upper extremity assist PT Goal: Sit to Stand - Progress: Updated due to goal met Pt will go Stand to Sit: without upper extremity assist;with modified independence PT Goal: Stand to Sit - Progress: Updated due to goals met PT Goal: Stand - Progress: Met Pt will Ambulate: with modified independence;with least restrictive assistive device PT Goal: Ambulate - Progress: Updated due to goal met  PT Treatment Precautions/Restrictions  Precautions Precautions: Sternal;Fall Precaution Comments: R sided chest tube (still in 09/06/11) Restrictions Weight Bearing Restrictions: No Mobility (including Balance) Bed Mobility Supine to Sit: 5: Supervision;HOB elevated (Comment degrees) (45 degrees) Supine to Sit Details (indicate cue type and reason): cueing for sternal precautions (pt with poor adherence); able to scoot hips around EOB without any physical assist Sitting - Scoot to Edge of Bed: 6: Modified independent (Device/Increase time) Transfers Sit to Stand: 5: Supervision;From bed;From toilet Sit to Stand Details (indicate cue type and reason): cueing for safe hand placement and sternal precautions (again pt with poor adherence regardless of cueing) Stand to Sit: 5: Supervision;To toilet;To bed Stand to Sit Details: pt needs cueing for safe technique  especially for sternal precautions but moves relatively well (a little impulsive)  Ambulation/Gait Ambulation/Gait: Yes Ambulation/Gait Assistance Details (indicate cue type and reason): amb approx 140 ft with RW and mingaurda-minA; tactile cues/visual cueing for upright posture and less reliance on RW; pt with poor endurance for tall posture as he flexes forward after 10-15 seconds of ambulation; easily fatigued Ambulation Distance (Feet): 140 Feet Assistive device: Rolling walker Gait Pattern: Trunk flexed Stairs: No Wheelchair Mobility Wheelchair Mobility: No  Posture/Postural Control Posture/Postural Control: Postural limitations Postural Limitations: flexed trunk, rounded shoulders and flexed neck; able to assume upright posture with cueing but poor tolerance to maintain Balance Balance Assessed: No Exercise  Total Joint Exercises Ankle Circles/Pumps: AROM;Both;10 reps;Supine End of Session PT - End of Session Equipment Utilized During Treatment: Gait belt Activity Tolerance: Patient tolerated treatment well;Patient limited by fatigue Patient left: in bed;with call bell in reach Nurse Communication: Mobility status for transfers;Mobility status for ambulation General Behavior During Session: [facility] for tasks performed Cognition: Impaired Cognitive Impairment: slightly impulsive; very independently minded; doesn't appear too aware of his safety risks (sternal precautions)   WHITLOW,Vinnie Bobst [name] 09/06/2011, 11:05 AM

## 2011-09-06 NOTE — Progress Notes (Signed)
OT Cancellation Note  Treatment cancelled today due to medical issues with patient which prohibited therapy: checked on patient x 3. Each time patient c/o chest pain (RN informed) with no relief. Will check back again as schedule allows.  Ranny Wiebelhaus 09/06/2011, 2:25 PM

## 2011-09-06 NOTE — Progress Notes (Signed)
Cardiac Rehab 1422 Offered to walk with pt. Strongly encouraged. Pt adamantly refused. Stated he was hurting too much. We discussed with pt that surgeon wanted him to walk.Marland Kitchen He said he would deal with the doctor. Encouraged pt to walk with staff later when feeling better.Darin Redmann DunlapRN

## 2011-09-07 ENCOUNTER — Inpatient Hospital Stay (HOSPITAL_COMMUNITY): Payer: Medicare Other

## 2011-09-07 LAB — CBC
HCT: 31.8 % — ABNORMAL LOW (ref 39.0–52.0)
Hemoglobin: 9.8 g/dL — ABNORMAL LOW (ref 13.0–17.0)
MCH: 24.5 pg — ABNORMAL LOW (ref 26.0–34.0)
MCHC: 30.8 g/dL (ref 30.0–36.0)
MCV: 79.5 fL (ref 78.0–100.0)

## 2011-09-07 LAB — BASIC METABOLIC PANEL
BUN: 20 mg/dL (ref 6–23)
BUN: 21 mg/dL (ref 6–23)
Calcium: 8.8 mg/dL (ref 8.4–10.5)
Chloride: 99 mEq/L (ref 96–112)
Creatinine, Ser: 1.4 mg/dL — ABNORMAL HIGH (ref 0.50–1.35)
Creatinine, Ser: 1.46 mg/dL — ABNORMAL HIGH (ref 0.50–1.35)
GFR calc non Af Amer: 49 mL/min — ABNORMAL LOW (ref >90)
Glucose, Bld: 95 mg/dL (ref 70–99)
Glucose, Bld: 111 mg/dL — ABNORMAL HIGH (ref 70–99)
Potassium: 4 mEq/L (ref 3.5–5.1)

## 2011-09-07 LAB — GLUCOSE, CAPILLARY
Glucose-Capillary: 94 mg/dL (ref 70–99)
Glucose-Capillary: 103 mg/dL — ABNORMAL HIGH (ref 70–99)

## 2011-09-07 NOTE — Progress Notes (Signed)
Chest tube discontinued per protocol 09/07/2011 At 3:24 PM Patient tolerated well. Vaseline occlusive dressing and gauze applied.  Merissa Renwick Diane   Removed every other staple from mid sternum incision per order patient tolerated well. Will continue to monitor.Rakin Lemelle, Chrystine Oiler

## 2011-09-07 NOTE — Progress Notes (Signed)
Patient's oxygen sat. Seems to hover around 90-91%, but while drop to 88% and quickly rise back to 90%. While ambulating, 02 sat 88%. 02 at 2L applied, sats increased to 94%. Patient ambulated 100 ft using RW and one assist. Tolerated well, but feels tired. Back in bed, 02 at 1L while in bed to keep sat above 90%. Will continue to monitor. Tina Gruner, Chrystine Oiler

## 2011-09-07 NOTE — Progress Notes (Signed)
Assessed patient's ambulation status. Patient stated he had walked four times today. Will continue to monitor.

## 2011-09-07 NOTE — Progress Notes (Signed)
Pt. o2 sat checked, 93-94% on room air. Warren [name], Warren [name]

## 2011-09-07 NOTE — Progress Notes (Addendum)
                    [address]            [place] [address]          [phone]     9 Days Post-Op Procedure(s) (LRB): MEDIASTINAL EXPLORATION (N/A)  Subjective: Stable, no complaints.  Objective: Vital signs in last 24 hours: Patient Vitals for the past 24 hrs:  BP Temp Temp src Pulse Resp SpO2 Weight  09/07/11 0519 - - - - - - 81.9 kg (180 lb 8.9 oz)  09/07/11 0334 118/78 mmHg - Oral 80  19  96 % -  09/06/11 2205 150/92 mmHg - - 103  - - -  09/06/11 2044 128/74 mmHg 98.2 F (36.8 C) Oral 92  19  99 % -  09/06/11 1401 115/71 mmHg 98.4 F (36.9 C) - 88  20  96 % -  09/06/11 1057 132/85 mmHg - - 105  - - -   Current Weight  09/07/11 81.9 kg (180 lb 8.9 oz)  Pre-op wt= 82 kg   Intake/Output from previous day: 01/25 0701 - 01/26 0700 In: 120 [P.O.:120] Out: 1510 [Urine:1500; Chest Tube:10]  CBGs 106-101-88-95  PHYSICAL EXAM:  Heart: RRR, some PVCs Lungs: generally clear Wound: clean and dry, decreased SQ air bilat chest/neck Extremities: no significant edema CT: no air leak  Lab Results: CBC: Basename 09/05/11 0530  WBC 5.6  HGB 9.5*  HCT 30.1*  PLT 207   BMET:  Basename 09/07/11 0545 09/05/11 0530  NA 141 138  K 4.0 3.8  CL 99 99  CO2 35* 32  GLUCOSE 95 103*  BUN 20 20  CREATININE 1.40* 1.38*  CALCIUM 8.9 8.7    CXR: stable, slight decrease in SQ air  Assessment/Plan: S/P Procedure(s) (LRB): MEDIASTINAL EXPLORATION (N/A) CV-Continue beta blocker.  BPs still somewhat elevated.  May need to resume Norvasc if they continue to increase. HRs better. Vol overload- diurese ARI- Cr generally stable.  Monitor. SQ air improving. Continue chest tube for now.       LOS: 36 days    COLLINS,GINA H 09/07/2011   Pt seen and examined agree with above. He still has SQ air, but it's markedly improved. No air leak and hasn't had one for days. Will d/c CT and observe. D/c every other staple

## 2011-09-07 NOTE — Progress Notes (Signed)
Patient's chest tube just removed.  Encouraged patient to walk in hallway later.

## 2011-09-07 NOTE — Progress Notes (Signed)
Patient did not have any output in his chest tube on night shift. Will continue to monitor.

## 2011-09-07 NOTE — Progress Notes (Signed)
Serous drainage from CT site removal. VSS, dressing changed, Dr. Laneta Simmers notified. No new orders. Will continue to monitor. Jace Dowe, Chrystine Oiler

## 2011-09-07 NOTE — Progress Notes (Signed)
Patient ID: Warren [name], male   DOB: 02-Sep-1945, 66 y.o.   MRN: [id number] @ Subjective:  [name] at nurse.  Sternal stables itch  Objective:  Filed Vitals:   09/06/11 2044 09/06/11 2205 09/07/11 0334 09/07/11 0519  BP: 128/74 150/92 118/78   Pulse: 92 103 80   Temp: 98.2 F (36.8 C)     TempSrc: Oral  Oral   Resp: 19  19   Height:      Weight:    81.9 kg (180 lb 8.9 oz)  SpO2: 99%  96%     Intake/Output from previous day:  Intake/Output Summary (Last 24 hours) at 09/07/11 1001 Last data filed at 09/07/11 0340  Gross per 24 hour  Intake    120 ml  Output   1510 ml  Net  -1390 ml    Physical Exam: Affect appropriate Chronically ill white male HEENT: normal Neck supple with no adenopathy JVP normal no bruits no thyromegaly Lungs clear with no wheezing and good diaphragmatic motion Heart:  S1/S2 SEM murmur, no rub, gallop or click PMI normal Abdomen: benighn, BS positve, no tenderness, no AAA no bruit.  No HSM or HJR Distal pulses intact with no bruits No edema Neuro non-focal Staples in chest mild erythema  No muscular weakness Chest tube right lung   Lab Results: Basic Metabolic Panel:  Basename 09/07/11 0545 09/05/11 0530  NA 141 138  K 4.0 3.8  CL 99 99  CO2 35* 32  GLUCOSE 95 103*  BUN 20 20  CREATININE 1.40* 1.38*  CALCIUM 8.9 8.7  MG -- --  PHOS -- --   Liver Function Tests: No results found for this basename: AST:2,ALT:2,ALKPHOS:2,BILITOT:2,PROT:2,ALBUMIN:2 in the last 72 hours No results found for this basename: LIPASE:2,AMYLASE:2 in the last 72 hours CBC:  Basename 09/05/11 0530  WBC 5.6  NEUTROABS --  HGB 9.5*  HCT 30.1*  MCV 78.6  PLT 207    Imaging: Dg Chest Port 1 View  09/07/2011  *RADIOLOGY REPORT*  Clinical Data: Subcutaneous edema.  Chest tube.  PORTABLE CHEST - 1 VIEW  Comparison: 09/06/2011 and 09/05/2011.  Findings: 0545 hours.  Right chest tube appears unchanged in position.  No pneumothorax is identified.  Diffuse  subcutaneous emphysema is again noted bilaterally.  Cardiomegaly, pleural effusions and bibasilar atelectasis appear unchanged.  IMPRESSION: Stable examination with persistent pleural effusions, basilar atelectasis and diffuse subcutaneous emphysema.  Original Report Authenticated By: Gerrianne Scale, M.D.   Dg Chest Port 1 View  09/06/2011  *RADIOLOGY REPORT*  Clinical Data: Pneumothorax  PORTABLE CHEST - 1 VIEW  Comparison: Multiple recent previous exams.  Findings: 0655 hours. The cardiopericardial silhouette is enlarged. Stable appearance of the bibasilar atelectasis/infiltrate and small bilateral pleural effusion.  The right chest tube remains in place. No pneumothorax is evident.  The subcutaneous emphysema persists. Telemetry leads overlie the chest.  IMPRESSION: No substantial change.  Original Report Authenticated By: ERIC A. MANSELL, M.D.    Cardiac Studies:  ECG:  Orders placed during the hospital encounter of 08/02/11  . EKG 12-LEAD  . EKG 12-LEAD  . EKG 12-LEAD  . EKG 12-LEAD  . EKG 12-LEAD  . EKG 12-LEAD     Telemetry: NSR no afib   Medications:     . aspirin EC  325 mg Oral Daily  . bisacodyl  10 mg Oral Daily  . feeding supplement  237 mL Oral BID WC  . furosemide  40 mg Oral Daily  . insulin aspart  0-9  Units Subcutaneous TID WC  . metoprolol tartrate  75 mg Oral BID  . pantoprazole  40 mg Oral Q1200  . potassium chloride  20 mEq Oral BID  . sodium chloride  3 mL Intravenous Q12H  . Tamsulosin HCl  0.8 mg Oral QPC breakfast       Assessment/Plan:  AVR:  Redo valve sounds fine Mediastinal:  Reexploration for retained swan.  D/C chest tube soon Rhythm:  Stable continue pain control and beta blocker  Charlton Haws 09/07/2011, 10:01 AM

## 2011-09-08 ENCOUNTER — Inpatient Hospital Stay (HOSPITAL_COMMUNITY): Payer: Medicare Other

## 2011-09-08 LAB — BASIC METABOLIC PANEL
CO2: 36 mEq/L — ABNORMAL HIGH (ref 19–32)
Calcium: 8.9 mg/dL (ref 8.4–10.5)
Potassium: 3.8 mEq/L (ref 3.5–5.1)
Sodium: 139 mEq/L (ref 135–145)

## 2011-09-08 LAB — GLUCOSE, CAPILLARY
Glucose-Capillary: 97 mg/dL (ref 70–99)
Glucose-Capillary: 121 mg/dL — ABNORMAL HIGH (ref 70–99)
Glucose-Capillary: 139 mg/dL — ABNORMAL HIGH (ref 70–99)

## 2011-09-08 MED ORDER — FUROSEMIDE 40 MG PO TABS
40.0000 mg | ORAL_TABLET | Freq: Every day | ORAL | Status: DC
  Administered 2011-09-09: 40 mg via ORAL
  Filled 2011-09-08 (×2): qty 1

## 2011-09-08 MED ORDER — POTASSIUM CHLORIDE CRYS ER 20 MEQ PO TBCR
40.0000 meq | EXTENDED_RELEASE_TABLET | Freq: Once | ORAL | Status: AC
  Administered 2011-09-08: 40 meq via ORAL

## 2011-09-08 MED ORDER — POTASSIUM CHLORIDE CRYS ER 20 MEQ PO TBCR
20.0000 meq | EXTENDED_RELEASE_TABLET | Freq: Every day | ORAL | Status: DC
  Administered 2011-09-09 – 2011-09-11 (×3): 20 meq via ORAL
  Filled 2011-09-08 (×4): qty 1

## 2011-09-08 NOTE — Progress Notes (Signed)
Patient weaned to 1L O2, but continues to take of oxygen and desats as low as 85%. Patient reminded to keep O2 on, he is however irritable, says everyone is telling him different information about the oxygen. Explained to patient the reason for trying to wean the oxygen and the also need for him to wear the nasal cannula. Patient verbalized understanding.

## 2011-09-08 NOTE — Progress Notes (Signed)
Patient ambulation status addressed, asked patient to walk. Patient always says he will walk later. Will continue to encourage walks. Laurynn Mccorvey, Chrystine Oiler

## 2011-09-08 NOTE — Progress Notes (Signed)
Patient ambulated 250 ft using RW and one standby assist. 02 at 2L to maintain sats in 90's. Tolerated well. Encouraged one more walk today. Back in bed with O2 at 1L, call bell in reach. Sherronda Sweigert, Chrystine Oiler

## 2011-09-08 NOTE — Progress Notes (Addendum)
                    [address]            ,[place] [address]          [phone]     10 Days Post-Op Procedure(s) (LRB): MEDIASTINAL EXPLORATION (N/A)  Subjective: Stable night, no complaints.    Objective: Vital signs in last 24 hours: Patient Vitals for the past 24 hrs:  BP Temp Temp src Pulse Resp SpO2 Weight  09/08/11 0504 131/78 mmHg 97 F (36.1 C) Oral 94  18  94 % 81.647 kg (180 lb)  09/07/11 2002 130/72 mmHg - - - - - -  09/07/11 1948 160/56 mmHg 97.4 F (36.3 C) Oral 96  18  94 % -  09/07/11 1700 114/75 mmHg 97.6 F (36.4 C) Oral 98  18  99 % -   Current Weight  09/08/11 81.647 kg (180 lb)     Intake/Output from previous day: 01/26 0701 - 01/27 0700 In: 480 [P.O.:480] Out: 1400 [Urine:1400]    PHYSICAL EXAM:  Heart: RRR Lungs: slightly decreased BS in bases Wound: clean and dry Extremities: no significant edema  Lab Results: CBC: Basename 09/07/11 1009  WBC 6.6  HGB 9.8*  HCT 31.8*  PLT 216   BMET:  Basename 09/08/11 0500 09/07/11 1009  NA 139 139  K 3.8 4.0  CL 98 98  CO2 36* 34*  GLUCOSE 94 111*  BUN 20 21  CREATININE 1.50* 1.46*  CALCIUM 8.9 8.8    CXR: no ptx, decreased SQ air  Assessment/Plan: S/P Procedure(s) (LRB): MEDIASTINAL EXPLORATION (N/A) SQ air resolving, CT out.  Continue to monitor. CV- BPs generally stable.  Continue current meds. Vol overload- wt stable, no edema.  Will d/c Lasix in light of elevated Cr. ARI- Cr up slightly today. Hold Lasix and watch. CRPI. Pulm- desats with ambulation.  Cont pulm toilet, wean O2 as tolerated.  May need home O2.   LOS: 37 days    COLLINS,GINA H 09/08/2011   CXR looks ok but he does have small bilateral pleural effusions and bibasilar atelectasis.  I think he is probably going to have to stay on daily lasix.  His creat is at baseline for him.

## 2011-09-08 NOTE — Progress Notes (Signed)
Patient ambulated 150 ft on 2L O2, using RW and one assist. Tolerated well. Sats remained in 90's. Will continue to monitor and encourage two more walks today. Helia Haese, Chrystine Oiler

## 2011-09-09 ENCOUNTER — Inpatient Hospital Stay (HOSPITAL_COMMUNITY): Payer: Medicare Other

## 2011-09-09 LAB — GLUCOSE, CAPILLARY: Glucose-Capillary: 122 mg/dL — ABNORMAL HIGH (ref 70–99)

## 2011-09-09 LAB — BASIC METABOLIC PANEL
BUN: 19 mg/dL (ref 6–23)
CO2: 34 mEq/L — ABNORMAL HIGH (ref 19–32)
Calcium: 9 mg/dL (ref 8.4–10.5)
Chloride: 100 mEq/L (ref 96–112)
Creatinine, Ser: 1.43 mg/dL — ABNORMAL HIGH (ref 0.50–1.35)
GFR calc Af Amer: 58 mL/min — ABNORMAL LOW (ref >90)

## 2011-09-09 NOTE — Progress Notes (Signed)
Occupational Therapy Treatment Patient Details Name: Warren [name] MRN: [id number] DOB: Feb 25, 1946 Today's Date: 09/09/2011  OT Assessment/Plan OT Assessment/Plan Comments on Treatment Session: Making great progress, but continues to be somewhat unsteady with ambulation and higher balance activities OT Plan: Discharge plan remains appropriate Follow Up Recommendations: No OT follow up Equipment Recommended: Tub/shower seat (with back) OT Goals ADL Goals ADL Goal: Lower Body Dressing - Progress: Progressing toward goals ADL Goal: Toilet Transfer - Progress: Progressing toward goals ADL Goal: Tub/Shower Transfer - Progress: Progressing toward goals  OT Treatment Precautions/Restrictions  Precautions Precautions: Fall   ADL ADL Grooming: Performed;Independent Where Assessed - Grooming: Standing at sink Lower Body Dressing: Performed;Supervision/safety Lower Body Dressing Details (indicate cue type and reason): supervision for manipulation of clothing while standing Where Assessed - Lower Body Dressing: Sit to stand from bed Toilet Transfer: Performed;Modified independent Toilet Transfer Method: Proofreader: Regular height toilet;Grab bars Toileting - Clothing Manipulation: Performed;Independent Where Assessed - Toileting Clothing Manipulation: Standing Toileting - Hygiene: Not assessed Tub/Shower Transfer: Performed;Supervision/safety Tub/Shower Transfer Method: Science writer: Shower seat with back;Grab bars Equipment Used: Rolling walker Ambulation Related to ADLs: Supervision with ambulation. At times unsteady on feet and leaving RW behind. Mobility  Bed Mobility Supine to Sit: 6: Modified independent (Device/Increase time);HOB elevated (Comment degrees) (20)  End of Session OT - End of Session Equipment Utilized During Treatment: Gait belt Activity Tolerance: Patient tolerated treatment well General Behavior During  Session: [facility] for tasks performed Cognition: [facility] for tasks performed  Warren [name]  09/09/2011, 12:57 PM

## 2011-09-09 NOTE — Progress Notes (Signed)
UR Completed.  Warren [name] [phone] 09/09/2011  

## 2011-09-09 NOTE — Progress Notes (Signed)
CARDIAC REHAB PHASE I   PRE:  Rate/Rhythm: 105 ST with PACs    BP: sitting 122/68    SaO2: 86 RA in bed, 97 2L  MODE:  Ambulation: 350 ft   POST:  Rate/Rhythm: 105 ST with PACs    BP: sitting 124/76     SaO2: 90-91 1L  Tolerated well on 1L with RW. Many c/o (lack of sleep, tired of hospital, etc). Still requiring 1L O2. Reminded of daily walks, sitting up in recliner, using incentive. 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 Sts he will have friends with him off and on at home. Will f/u. [phone]  Harriet Masson CES, ACSM

## 2011-09-09 NOTE — Progress Notes (Signed)
   CARE MANAGEMENT NOTE 09/09/2011  Patient:  Warren [name], Warren [name]   Account Number:  [id number]  Date Initiated:  08/29/2011  Documentation initiated by:  [facility]  Subjective/Objective Assessment:   post op 08-28-11 - AVR and AVR.     Action/Plan:   PTA, PT LIVES ALONE AND IS INDEPENDENT OF ADLS.   Anticipated DC Date:  09/12/2011   Anticipated DC Plan:  HOME W HOME HEALTH SERVICES  In-house referral  Clinical Social Worker      DC Planning Services  CM consult      Choice offered to / List presented to:     DME arranged  [employer] [employer]      DME agency  [facility]        Status of service:  In process, will continue to follow Medicare Important Message given?   (If response is "NO", the following Medicare IM given date fields will be blank) Date Medicare IM given:   Date Additional Medicare IM given:    Discharge Disposition:    Per UR Regulation:  Reviewed for med. necessity/level of care/duration of stay  Comments:  09/09/11 Ashawn Rinehart,RN,BSN 1145 MET WITH PT TO DISCUSS DC PLANS.  PT STATES HIS FRIENDS WILL PROVIDE MEALS AND SUPERVISION AT DISCHARGE.  HE REQUESTS [facility] FOR HOME, BUT IS NOT AGREEABLE TO HHRN FOR RESTORATIVE CARE.  STATES "I AM VERY INDEPENDENT, AND DON'T NEED ANY HELP."  PT INSTRUCTED TO CALL MD OFFICE IF HE CHANGES HIS MIND ABOUT HOME HEALTH SERVICES. Phone #[phone]   09-09-11 9:50am Avie Arenas, RNBSN - [phone] UR completed.  09-05-11 8:30am Avie Arenas, RNBSN [phone] UR completed.  09/04/11 Jahni Paul,RN,BSN 1530 PATIENT HAS BEEN EVALUATED FOR INPT REHAB, BUT HAS NOW PROGRESSED TO WHERE HE CAN LIKELY GO HOME WITH HOME HEALTH CARE.  HE LIVES ALONE, BUT STATES HE HAS FRIENDS THAT CAN PROVIDE 24HR CARE AT DISCHARGE.  HE STATES THAT HE WILL CONFIRM THAT FRIENDS CAN STAY WITH HIM FOR 24H/DAY X 7-10 DAYS AT DC.  HE IS AGREEABLE TO HOME HEALTH FOLLOW UP, IF NEEDED, AND STATES HE HAS ALL NEEDED EQUIPMENT AT HOME.  WILL FOLLOW . Phone #[phone]    09-03-11 10:50am Avie Arenas, RNBSN - [phone] UR Completed.  08-29-11 11:35am Avie Arenas,  RNBSN [phone] UR Completed.

## 2011-09-09 NOTE — Progress Notes (Signed)
Patient ID: Warren [name], male   DOB: 01-20-1946, 66 y.o.   MRN: [id number] @ Subjective:  Less SOB  Seen in Radiology  Objective:  Filed Vitals:   09/08/11 1012 09/08/11 1428 09/08/11 1936 09/09/11 0410  BP: 133/75 113/59 127/79 115/69  Pulse: 98 93 89 87  Temp:  97.8 F (36.6 C) 98.7 F (37.1 C) 98.3 F (36.8 C)  TempSrc:  Oral Oral Oral  Resp:  18 18 18   Height:      Weight:    80.786 kg (178 lb 1.6 oz)  SpO2:  93% 94% 90%    Intake/Output from previous day:  Intake/Output Summary (Last 24 hours) at 09/09/11 6213 Last data filed at 09/09/11 0200  Gross per 24 hour  Intake      0 ml  Output    350 ml  Net   -350 ml    Physical Exam: Affect appropriate Chronically ill white male HEENT: normal Neck supple with no adenopathy JVP normal no bruits no thyromegaly Lungs clear with no wheezing and good diaphragmatic motion Heart:  S1/S2 SEM murmur, no rub, gallop or click PMI normal Abdomen: benighn, BS positve, no tenderness, no AAA no bruit.  No HSM or HJR Distal pulses intact with no bruits No edema Neuro non-focal Staples in chest mild erythema  No muscular weakness Chest tube right lung is out   Lab Results: Basic Metabolic Panel:  Basename 09/08/11 0500 09/07/11 1009  NA 139 139  K 3.8 4.0  CL 98 98  CO2 36* 34*  GLUCOSE 94 111*  BUN 20 21  CREATININE 1.50* 1.46*  CALCIUM 8.9 8.8  MG -- --  PHOS -- --   Liver Function Tests: No results found for this basename: AST:2,ALT:2,ALKPHOS:2,BILITOT:2,PROT:2,ALBUMIN:2 in the last 72 hours No results found for this basename: LIPASE:2,AMYLASE:2 in the last 72 hours CBC:  Basename 09/07/11 1009  WBC 6.6  NEUTROABS --  HGB 9.8*  HCT 31.8*  MCV 79.5  PLT 216    Imaging: Dg Chest 2 View  09/08/2011  *RADIOLOGY REPORT*  Clinical Data: Aortic valve repair  CHEST - 2 VIEW  Comparison: Chest radiograph 09/07/2011  Findings: Sternotomy wires overlie stable cardiac silhouette. Mitral and aortic valve noted.   There are bilateral pleural effusions unchanged.  Lungs appear clear.  No pneumothorax. Extensive subcutaneous gas in the left and right upper hemithorax and neck is unchanged.  IMPRESSION:  1.  No interval change. 2. Bilateral l pleural effusions and basilar atelectasis. 3.  Extensive subcutaneous gas.  Original Report Authenticated By: Genevive Bi, M.D.   Dg Chest Port 1 View  09/07/2011  *RADIOLOGY REPORT*  Clinical Data: 66 year old male status post chest tube removal.  PORTABLE CHEST - 1 VIEW  Comparison: 0545 hours the same day earlier.  Findings: Semi upright AP portable view 1530 hours.  Right chest tube is then removed.  Extensive subcutaneous emphysema is identified about the chest and lower neck.  No pneumothorax is identified. Stable cardiomegaly and mediastinal contours.  Left greater than right pleural effusions and dense retrocardiac opacity.  IMPRESSION: 1.  Right chest tube removed with no pneumothorax identified. 2.  Extensive chest wall and neck subcutaneous emphysema. 3.  Left greater than right pleural effusions and lower lobe collapse or consolidation.  Original Report Authenticated By: Harley Hallmark, M.D.    Cardiac Studies:   Telemetry: NSR no afib   Medications:      . aspirin EC  325 mg Oral Daily  . bisacodyl  10 mg Oral Daily  . feeding supplement  237 mL Oral BID WC  . furosemide  40 mg Oral Daily  . insulin aspart  0-9 Units Subcutaneous TID WC  . metoprolol tartrate  75 mg Oral BID  . pantoprazole  40 mg Oral Q1200  . potassium chloride  20 mEq Oral Daily  . potassium chloride  40 mEq Oral Once  . sodium chloride  3 mL Intravenous Q12H  . Tamsulosin HCl  0.8 mg Oral QPC breakfast  . DISCONTD: furosemide  40 mg Oral Daily  . DISCONTD: potassium chloride  20 mEq Oral BID       Assessment/Plan:  AVR:  Redo valve sounds fine Mediastinal exploration with subq air  CXR pending CT out Rhythm:  Stable continue pain control and beta blocker  Charlton Haws 09/09/2011, 8:07 AM

## 2011-09-09 NOTE — Progress Notes (Addendum)
[name]            [place] [address]          [phone]     11 Days Post-Op  Procedure(s) (LRB): MEDIASTINAL EXPLORATION (N/A) Subjective: Feels well having some c/o poor urinary flow. Already on flomax 0.8/daily Objective  Telemetry SR/PVC's/PAC's  Temp:  [97.7 F (36.5 C)-98.7 F (37.1 C)] 97.7 F (36.5 C) (01/28 1313) Pulse Rate:  [87-89] 87  (01/28 1313) Resp:  [18] 18  (01/28 1313) BP: (101-127)/(65-79) 101/65 mmHg (01/28 1313) SpO2:  [90 %-94 %] 91 % (01/28 1313) Weight:  [178 lb 1.6 oz (80.786 kg)] 178 lb 1.6 oz (80.786 kg) (01/28 0410)   Intake/Output Summary (Last 24 hours) at 09/09/11 1539 Last data filed at 09/09/11 0200  Gross per 24 hour  Intake      0 ml  Output    350 ml  Net   -350 ml       General appearance: alert, appears stated age and no distress Heart: regular rate and rhythm and S1, S2 normal Lungs: diminished in the bases, mildly Abdomen: soft, nontender Extremities: no edema Wound: incision healing well  Lab Results:  Basename 09/09/11 0645 09/08/11 0500  NA 142 139  K 4.0 3.8  CL 100 98  CO2 34* 36*  GLUCOSE 106* 94  BUN 19 20  CREATININE 1.43* 1.50*  CALCIUM 9.0 8.9  MG -- --  PHOS -- --   No results found for this basename: AST:2,ALT:2,ALKPHOS:2,BILITOT:2,PROT:2,ALBUMIN:2 in the last 72 hours No results found for this basename: LIPASE:2,AMYLASE:2 in the last 72 hours  Basename 09/07/11 1009  WBC 6.6  NEUTROABS --  HGB 9.8*  HCT 31.8*  MCV 79.5  PLT 216   No results found for this basename: CKTOTAL:4,CKMB:4,TROPONINI:4 in the last 72 hours No components found with this basename: POCBNP:3 No results found for this basename: DDIMER in the last 72 hours No results found for this basename: HGBA1C in the last 72 hours No results found for this basename: CHOL,HDL,LDLCALC,TRIG,CHOLHDL in the last 72 hours No results found for this basename: TSH,T4TOTAL,FREET3,T3FREE,THYROIDAB in the  last 72 hours No results found for this basename: VITAMINB12,FOLATE,FERRITIN,TIBC,IRON,RETICCTPCT in the last 72 hours  Medications: Scheduled    . aspirin EC  325 mg Oral Daily  . bisacodyl  10 mg Oral Daily  . feeding supplement  237 mL Oral BID WC  . furosemide  40 mg Oral Daily  . insulin aspart  0-9 Units Subcutaneous TID WC  . metoprolol tartrate  75 mg Oral BID  . pantoprazole  40 mg Oral Q1200  . potassium chloride  20 mEq Oral Daily  . sodium chloride  3 mL Intravenous Q12H  . Tamsulosin HCl  0.8 mg Oral QPC breakfast     Radiology/Studies:  Dg Chest 2 View  09/09/2011  *RADIOLOGY REPORT*  Clinical Data: Aortic valve replacement  CHEST - 2 VIEW  Comparison: Yesterday  Findings: Soft tissue gas over the chest and supraclavicular region has improved.  Bilateral small pleural effusions are stable. Bibasilar atelectasis stable.  Otherwise clear lungs. Cardiomegaly.  IMPRESSION: Improved soft tissue emphysema.  Stable effusions and bibasilar atelectasis.  Original Report Authenticated By: Donavan Burnet, M.D.   Dg Chest 2 View  09/08/2011  *RADIOLOGY REPORT*  Clinical Data: Aortic valve repair  CHEST - 2 VIEW  Comparison: Chest radiograph 09/07/2011  Findings: Sternotomy wires overlie stable cardiac silhouette. Mitral and aortic valve noted.  There are bilateral pleural effusions unchanged.  Lungs appear clear.  No pneumothorax. Extensive subcutaneous gas in the left and right upper hemithorax and neck is unchanged.  IMPRESSION:  1.  No interval change. 2. Bilateral l pleural effusions and basilar atelectasis. 3.  Extensive subcutaneous gas.  Original Report Authenticated By: Genevive Bi, M.D.   Dg Chest Port 1 View  09/07/2011  *RADIOLOGY REPORT*  Clinical Data: 66 year old male status post chest tube removal.  PORTABLE CHEST - 1 VIEW  Comparison: 0545 hours the same day earlier.  Findings: Semi upright AP portable view 1530 hours.  Right chest tube is then removed.  Extensive  subcutaneous emphysema is identified about the chest and lower neck.  No pneumothorax is identified. Stable cardiomegaly and mediastinal contours.  Left greater than right pleural effusions and dense retrocardiac opacity.  IMPRESSION: 1.  Right chest tube removed with no pneumothorax identified. 2.  Extensive chest wall and neck subcutaneous emphysema. 3.  Left greater than right pleural effusions and lower lobe collapse or consolidation.  Original Report Authenticated By: Harley Hallmark, M.D.    INR: Will add last result for INR, ABG once components are confirmed Will add last 4 CBG results once components are confirmed  Assessment/Plan: S/P Procedure(s) (LRB): MEDIASTINAL EXPLORATION (N/A)  1. Steady progress, push rehab/pulm toilet, sats improving-currently off O2(cont to monitor) 2. Sq air improved on cxr 3. On flomax 4. Renal fx relatively stable 5. cbg's ok, will d/c cbg/ssi  LOS: 38 days    Warren [name],Warren [name] 1/28/20133:39 PM    I have seen and examined Warren [name] and agree with the above assessment  and plan.  [name] MD Beeper [phone] Office [phone] 09/09/2011 4:02 PM

## 2011-09-10 ENCOUNTER — Inpatient Hospital Stay (HOSPITAL_COMMUNITY): Payer: Medicare Other

## 2011-09-10 LAB — PRO B NATRIURETIC PEPTIDE: Pro B Natriuretic peptide (BNP): 25625 pg/mL — ABNORMAL HIGH (ref 0–125)

## 2011-09-10 MED ORDER — FUROSEMIDE 10 MG/ML IJ SOLN
40.0000 mg | Freq: Once | INTRAMUSCULAR | Status: DC
  Filled 2011-09-10: qty 4

## 2011-09-10 MED ORDER — FUROSEMIDE 10 MG/ML IJ SOLN
40.0000 mg | Freq: Two times a day (BID) | INTRAMUSCULAR | Status: AC
  Administered 2011-09-10 (×2): 40 mg via INTRAVENOUS
  Filled 2011-09-10 (×2): qty 4

## 2011-09-10 NOTE — Progress Notes (Signed)
PT Cancellation Note  Treatment cancelled today due to patient's refusal to participate. Second attempt for therapy today. Reports he is too painful and too short of breath. Encouraged pt to get up this pm with cardiac rehab.   Warren [name],Warren [name] [name] 09/10/2011, 11:25 AM

## 2011-09-10 NOTE — Progress Notes (Signed)
CARDIAC REHAB PHASE I   PRE:  Rate/Rhythm: 87 SR  BP:  Supine: 123/77  Sitting:   Standing:    SaO2: 94 RA  MODE:  Ambulation: 276 ft   POST:  Rate/Rhythem: 99 SR  BP:  Supine: 97/68  Sitting:   Standing:    SaO2: 92-93 RA during walk after 94 RA 1350-1425 Assisted X 1 and used walker to ambulate. States he did not feel like walking, but he would try. Able to walk 276 ft. RA sat during and after walk in the 90's. Back to bed after walk states he has been in chair earlier today. Pt c/o is of SOB and painful incision site. Call light in reach.  Beatrix Fetters

## 2011-09-10 NOTE — Progress Notes (Signed)
At 0610 pt complaint of SOB. Applied oxygen. Vitals 99%2L [place], 137/86, 95 NSR, 20. Felt the return of subcutaneous emphysema in a 2" area below the collar bone and hear crepitus. At 0615 Pt states "something is wrong, I dont feel right", subcutaneous emphysema around the pt neck. Notified oncall MD, orders received for stat chest xray; MD reports he felt the subcutaneous emphysema last night. Pt still reports SOB, 98%2L [place]. No signs of distress. Will continue to monitor

## 2011-09-10 NOTE — Progress Notes (Addendum)
[name]            [place] [address]          [phone]     12 Days Post-Op  Procedure(s) (LRB): MEDIASTINAL EXPLORATION (N/A) Subjective: Feels poorly , more SOB  Objective  Telemetry SR, frequent PAC's  Temp:  [97.4 F (36.3 C)-97.7 F (36.5 C)] 97.7 F (36.5 C) (01/29 0458) Pulse Rate:  [87-102] 102  (01/29 0458) Resp:  [12-20] 20  (01/29 0458) BP: (94-140)/(65-89) 140/89 mmHg (01/29 0458) SpO2:  [89 %-92 %] 92 % (01/29 0458) Weight:  [176 lb 5.9 oz (80 kg)] 176 lb 5.9 oz (80 kg) (01/29 0458)   Intake/Output Summary (Last 24 hours) at 09/10/11 0753 Last data filed at 09/10/11 0600  Gross per 24 hour  Intake    723 ml  Output      0 ml  Net    723 ml       General appearance: alert, cooperative and no distress Heart: irregularly irregular rhythm and friction rub heard  Lungs: diminished in bases, no wheeze, no crackles Abdomen: soft, non-tender; bowel sounds normal; no masses,  no organomegaly Extremities: no edema, redness or tenderness in the calves or thighs Wound: incision healing well  Lab Results:  Basename 09/09/11 0645 09/08/11 0500  NA 142 139  K 4.0 3.8  CL 100 98  CO2 34* 36*  GLUCOSE 106* 94  BUN 19 20  CREATININE 1.43* 1.50*  CALCIUM 9.0 8.9  MG -- --  PHOS -- --   No results found for this basename: AST:2,ALT:2,ALKPHOS:2,BILITOT:2,PROT:2,ALBUMIN:2 in the last 72 hours No results found for this basename: LIPASE:2,AMYLASE:2 in the last 72 hours  Basename 09/07/11 1009  WBC 6.6  NEUTROABS --  HGB 9.8*  HCT 31.8*  MCV 79.5  PLT 216   No results found for this basename: CKTOTAL:4,CKMB:4,TROPONINI:4 in the last 72 hours No components found with this basename: POCBNP:3 No results found for this basename: DDIMER in the last 72 hours No results found for this basename: HGBA1C in the last 72 hours No results found for this basename: CHOL,HDL,LDLCALC,TRIG,CHOLHDL in the last 72 hours No results  found for this basename: TSH,T4TOTAL,FREET3,T3FREE,THYROIDAB in the last 72 hours No results found for this basename: VITAMINB12,FOLATE,FERRITIN,TIBC,IRON,RETICCTPCT in the last 72 hours  Medications: Scheduled    . aspirin EC  325 mg Oral Daily  . bisacodyl  10 mg Oral Daily  . feeding supplement  237 mL Oral BID WC  . furosemide  40 mg Oral Daily  . metoprolol tartrate  75 mg Oral BID  . pantoprazole  40 mg Oral Q1200  . potassium chloride  20 mEq Oral Daily  . sodium chloride  3 mL Intravenous Q12H  . Tamsulosin HCl  0.8 mg Oral QPC breakfast  . DISCONTD: insulin aspart  0-9 Units Subcutaneous TID WC     Radiology/Studies:  Dg Chest 2 View  09/09/2011  *RADIOLOGY REPORT*  Clinical Data: Aortic valve replacement  CHEST - 2 VIEW  Comparison: Yesterday  Findings: Soft tissue gas over the chest and supraclavicular region has improved.  Bilateral small pleural effusions are stable. Bibasilar atelectasis stable.  Otherwise clear lungs. Cardiomegaly.  IMPRESSION: Improved soft tissue emphysema.  Stable effusions and bibasilar atelectasis.  Original Report Authenticated By: Donavan Burnet, M.D.   Dg Chest 2 View  09/08/2011  *RADIOLOGY REPORT*  Clinical Data:  Aortic valve repair  CHEST - 2 VIEW  Comparison: Chest radiograph 09/07/2011  Findings: Sternotomy wires overlie stable cardiac silhouette. Mitral and aortic valve noted.  There are bilateral pleural effusions unchanged.  Lungs appear clear.  No pneumothorax. Extensive subcutaneous gas in the left and right upper hemithorax and neck is unchanged.  IMPRESSION:  1.  No interval change. 2. Bilateral l pleural effusions and basilar atelectasis. 3.  Extensive subcutaneous gas.  Original Report Authenticated By: Genevive Bi, M.D.   Dg Chest Port 1 View  09/10/2011  *RADIOLOGY REPORT*  Clinical Data: Shortness of breath  PORTABLE CHEST - 1 VIEW  Comparison: 09/09/2011  Findings: Postoperative changes in the mediastinum are stable since  previous study.  Cardiac enlargement.  Small bilateral pleural effusions with basilar atelectasis, similar to the previous study. There is increasing perihilar infiltration since the previous study suggesting developing edema.  No pneumothorax.  Bilateral subcutaneous emphysema is stable.  Calcification of the aorta.  IMPRESSION: Cardiac enlargement with bilateral pleural effusions basilar atelectasis.  Increasing perihilar infiltration suggest developing edema.  Stable subcutaneous emphysema.  Original Report Authenticated By: Marlon Pel, M.D.    INR: Will add last result for INR, ABG once components are confirmed Will add last 4 CBG results once components are confirmed  Assessment/Plan: S/P Procedure(s) (LRB): MEDIASTINAL EXPLORATION (N/A)  1. More SOB, some increased pulm edema on CXR. Will give lasix 40 bid IV today, check BNP 2. pulm toilet/rehab- continue   LOS: 39 days    Warren [name],Warren [name] 1/29/20137:53 AM    patient examined and medical record reviewed,agree with above note.  CXR findings stable. More lasix should improve his SOB but some will be chronic. VAN TRIGT III,PETER 09/10/2011

## 2011-09-10 NOTE — Progress Notes (Signed)
Nutrition Follow-up  Diet Order:  Carb Mod with chopped meats. Glucerna BID. PO intake was improved prior to last three meals. Per MD notes patient with increased SOB, subcutaneous emphysema. Increased diuretics.   Meds: Scheduled Meds:   . aspirin EC  325 mg Oral Daily  . bisacodyl  10 mg Oral Daily  . feeding supplement  237 mL Oral BID WC  . furosemide  40 mg Intravenous BID  . metoprolol tartrate  75 mg Oral BID  . pantoprazole  40 mg Oral Q1200  . potassium chloride  20 mEq Oral Daily  . sodium chloride  3 mL Intravenous Q12H  . Tamsulosin HCl  0.8 mg Oral QPC breakfast  . DISCONTD: furosemide  40 mg Intravenous Once  . DISCONTD: furosemide  40 mg Oral Daily  . DISCONTD: insulin aspart  0-9 Units Subcutaneous TID WC   Continuous Infusions:  PRN Meds:.sodium chloride, ALPRAZolam, diphenhydrAMINE, morphine injection, ondansetron (ZOFRAN) IV, oxyCODONE, sodium chloride  Labs:  CMP     Component Value Date/Time   NA 142 09/09/2011 0645   K 4.0 09/09/2011 0645   CL 100 09/09/2011 0645   CO2 34* 09/09/2011 0645   GLUCOSE 106* 09/09/2011 0645   BUN 19 09/09/2011 0645   CREATININE 1.43* 09/09/2011 0645   CREATININE 2.19* 06/26/2008 2130   CALCIUM 9.0 09/09/2011 0645   PROT 5.4* 08/27/2011 1503   ALBUMIN 3.1* 08/28/2011 0645   AST 11 08/27/2011 1503   ALT 9 08/27/2011 1503   ALKPHOS 102 08/27/2011 1503   BILITOT 0.8 08/27/2011 1503   GFRNONAA 50* 09/09/2011 0645   GFRAA 58* 09/09/2011 0645     Intake/Output Summary (Last 24 hours) at 09/10/11 1418 Last data filed at 09/10/11 1029  Gross per 24 hour  Intake    723 ml  Output   1100 ml  Net   -377 ml    Weight Status:  176 lbs, trending down since 1/17, partially related to negative fluid balance, near admission weight   Re-estimated needs:  Remains unchanged, 2000-2200 kcal, 100-120 gm protein  Nutrition Dx:  Inadequate oral intake, ongoing  Goal:  Meet > 90% of estimated nutrition needs, currently unmet.    Intervention:     1. Continue with current supplements for increased calorie and protein intake while po intake is poor. Recent poor po intake is likely related to increase in SOB, RD expects po intake to improve when SOB is decreased.   Monitor:  PO intake, weight, labs   Clarene Duke MARIE Pager #:  [phone]

## 2011-09-10 NOTE — Progress Notes (Signed)
Pt pleasantly refused his walk, stating he was just too tired and didn't feel well. Educated pt on the importance of walking. Will continue to monitor

## 2011-09-10 NOTE — Progress Notes (Signed)
Attempted PT this am. Pt with c/o too much pain and requests hold PT now. Will check back later this am as time allows. Ivonne Andrew PT, DPT [phone]

## 2011-09-10 NOTE — Progress Notes (Signed)
Patient Name: Warren [name] Date of Encounter: 09/10/2011    Active Problems:  RENAL INSUFFICIENCY, CHRONIC  Aortic regurgitation  Acute on chronic diastolic heart failure  Mitral regurgitation  Anemia    SUBJECTIVE: Pt reports worsening SOB. Reports mild persistent chest pain at sternotomy site.   OBJECTIVE  Filed Vitals:   09/09/11 0410 09/09/11 1313 09/09/11 2033 09/10/11 0458  BP: 115/69 101/65 94/75 140/89  Pulse: 87 87 93 102  Temp: 98.3 F (36.8 C) 97.7 F (36.5 C) 97.4 F (36.3 C) 97.7 F (36.5 C)  TempSrc: Oral Oral Oral Oral  Resp: 18 18 12 20   Height:      Weight: 80.786 kg (178 lb 1.6 oz)   80 kg (176 lb 5.9 oz)  SpO2: 90% 91% 89% 92%    Intake/Output Summary (Last 24 hours) at 09/10/11 0945 Last data filed at 09/10/11 0934  Gross per 24 hour  Intake    723 ml  Output    775 ml  Net    -52 ml   Weight change: -0.786 kg (-1 lb 11.7 oz)  PHYSICAL EXAM  General: NAD Head: Normocephalic, atraumatic, sclera non-icteric, no xanthomas, adentulous  Neck: Supple without bruits, mildly increased JVD. Lungs:  Resp regular and unlabored, trace bibasilar rales, no wheezes or rhonchi Heart: Tachycardic, friction rub heard, no s3, s4, or murmurs. Abdomen: Soft, non-tender, non-distended, BS + x 4.  Msk:  Sternotomy incision without erythema or exudate. Strength and tone appears normal for age. Extremities: 1+ pitting edema to high-ankle. No clubbing or cyanosis. DP/PT/Radials 2+ and equal bilaterally. Neuro: Alert and oriented X 3. Moves all extremities spontaneously. Psych: Normal affect.  LABS:  Recent Labs  Basename 09/07/11 1009   WBC 6.6   HGB 9.8*   HCT 31.8*   MCV 79.5   PLT 216    Lab 09/09/11 0645 09/08/11 0500 09/07/11 1009  NA 142 139 139  K 4.0 3.8 4.0  CL 100 98 98  CO2 34* 36* 34*  BUN 19 20 21   CREATININE 1.43* 1.50* 1.46*  CALCIUM 9.0 8.9 8.8  PROT -- -- --  BILITOT -- -- --  ALKPHOS -- -- --  ALT -- -- --  AST -- -- --    AMYLASE -- -- --  LIPASE -- -- --  GLUCOSE 106* 94 111*   TELE: NSR/sinus tachycardia- 90-110bpm, occasional PACs, PVCs  09/10/2011  ECG: No new tracings  Current Medications:     . aspirin EC  325 mg Oral Daily  . bisacodyl  10 mg Oral Daily  . feeding supplement  237 mL Oral BID WC  . furosemide  40 mg Intravenous BID  . metoprolol tartrate  75 mg Oral BID  . pantoprazole  40 mg Oral Q1200  . potassium chloride  20 mEq Oral Daily  . sodium chloride  3 mL Intravenous Q12H  . Tamsulosin HCl  0.8 mg Oral QPC breakfast  . DISCONTD: furosemide  40 mg Intravenous Once  . DISCONTD: furosemide  40 mg Oral Daily  . DISCONTD: insulin aspart  0-9 Units Subcutaneous TID WC    ASSESSMENT AND PLAN:  1. S/p AVR/MR- stable, persistent friction rub auscultated post-procedure. Stable subcutaneous emphysema on CXR today.   2. Sinus tachycardia- stable, rate-controlled  - Continue Lopressor  3. Dyspnea- Lasix increased per TCTS.   Signed, R. Hurman Horn, PA-C 09/10/2011, 9:45 AM  History reviewed with the patient, no changes to be made. He has continued chest pain and  it is difficult to assess whether this is incisional or not.  I suspect that there might be some element of chronic pin. The patient exam reveals few basilar crackles, murmur unchanged.  All available labs, radiology testing, previous records reviewed. Agree with documented assessment and plan. Agree with increased lasix given the dyspnea and edema on CXR today.  We need to watch the creat closely given the very significant renal insufficiency that he had preop. Warren [name]  1:12 PM 06/28/2011

## 2011-09-11 ENCOUNTER — Inpatient Hospital Stay (HOSPITAL_COMMUNITY): Payer: Medicare Other

## 2011-09-11 LAB — BASIC METABOLIC PANEL
CO2: 35 mEq/L — ABNORMAL HIGH (ref 19–32)
Calcium: 8.9 mg/dL (ref 8.4–10.5)
Creatinine, Ser: 1.67 mg/dL — ABNORMAL HIGH (ref 0.50–1.35)
GFR calc Af Amer: 48 mL/min — ABNORMAL LOW (ref >90)
Sodium: 139 mEq/L (ref 135–145)

## 2011-09-11 MED ORDER — FUROSEMIDE 10 MG/ML IJ SOLN
40.0000 mg | Freq: Two times a day (BID) | INTRAMUSCULAR | Status: AC
  Administered 2011-09-11 (×2): 40 mg via INTRAVENOUS
  Filled 2011-09-11 (×2): qty 4

## 2011-09-11 MED ORDER — AMIODARONE HCL 200 MG PO TABS
400.0000 mg | ORAL_TABLET | Freq: Two times a day (BID) | ORAL | Status: DC
  Administered 2011-09-11 – 2011-09-16 (×11): 400 mg via ORAL
  Filled 2011-09-11 (×13): qty 2

## 2011-09-11 NOTE — Progress Notes (Signed)
Patient discussed at the Long Length of Stay Warren [name] 09/11/2011  

## 2011-09-11 NOTE — Progress Notes (Signed)
[address]            [place] [address]          [phone]     13 Days Post-Op  Procedure(s) (LRB): MEDIASTINAL EXPLORATION (N/A) Subjective: Less SOB today, feels better, good diuresis >3liter  Objective  Telemetry NSR-afib, rapid at times, PAC's   Temp:  [97.1 F (36.2 C)-98.2 F (36.8 C)] 97.9 F (36.6 C) (01/30 0338) Pulse Rate:  [87-92] 87  (01/30 0338) Resp:  [16-20] 18  (01/30 0338) BP: (118-131)/(67-81) 122/67 mmHg (01/30 0338) SpO2:  [91 %-99 %] 95 % (01/30 0338) Weight:  [164 lb 10.9 oz (74.7 kg)] 164 lb 10.9 oz (74.7 kg) (01/30 0338)   Intake/Output Summary (Last 24 hours) at 09/11/11 0802 Last data filed at 09/11/11 0657  Gross per 24 hour  Intake      3 ml  Output   3300 ml  Net  -3297 ml       General appearance: alert and no distress Heart: regular rate and rhythm, S1, S2 normal and rub less distinct Lungs: diminished in bases Abdomen: soft, non tender Extremities: no LE edema Wound: incisions healing well  Lab Results:  Basename 09/09/11 0645  NA 142  K 4.0  CL 100  CO2 34*  GLUCOSE 106*  BUN 19  CREATININE 1.43*  CALCIUM 9.0  MG --  PHOS --   No results found for this basename: AST:2,ALT:2,ALKPHOS:2,BILITOT:2,PROT:2,ALBUMIN:2 in the last 72 hours No results found for this basename: LIPASE:2,AMYLASE:2 in the last 72 hours No results found for this basename: WBC:2,NEUTROABS:2,HGB:2,HCT:2,MCV:2,PLT:2 in the last 72 hours No results found for this basename: CKTOTAL:4,CKMB:4,TROPONINI:4 in the last 72 hours No components found with this basename: POCBNP:3 No results found for this basename: DDIMER in the last 72 hours No results found for this basename: HGBA1C in the last 72 hours No results found for this basename: CHOL,HDL,LDLCALC,TRIG,CHOLHDL in the last 72 hours No results found for this basename: TSH,T4TOTAL,FREET3,T3FREE,THYROIDAB in the last 72 hours No results found for this basename:  VITAMINB12,FOLATE,FERRITIN,TIBC,IRON,RETICCTPCT in the last 72 hours  Medications: Scheduled    . aspirin EC  325 mg Oral Daily  . bisacodyl  10 mg Oral Daily  . feeding supplement  237 mL Oral BID WC  . furosemide  40 mg Intravenous BID  . metoprolol tartrate  75 mg Oral BID  . pantoprazole  40 mg Oral Q1200  . potassium chloride  20 mEq Oral Daily  . sodium chloride  3 mL Intravenous Q12H  . Tamsulosin HCl  0.8 mg Oral QPC breakfast  . DISCONTD: furosemide  40 mg Intravenous Once     Radiology/Studies:  Dg Chest Port 1 View  09/10/2011  *RADIOLOGY REPORT*  Clinical Data: Shortness of breath  PORTABLE CHEST - 1 VIEW  Comparison: 09/09/2011  Findings: Postoperative changes in the mediastinum are stable since previous study.  Cardiac enlargement.  Small bilateral pleural effusions with basilar atelectasis, similar to the previous study. There is increasing perihilar infiltration since the previous study suggesting developing edema.  No pneumothorax.  Bilateral subcutaneous emphysema is stable.  Calcification of the aorta.  IMPRESSION: Cardiac enlargement with bilateral pleural effusions basilar atelectasis.  Increasing perihilar infiltration suggest developing edema.  Stable subcutaneous emphysema.  Original Report Authenticated By: Marlon Pel, M.D.    INR: Will add last result for INR, ABG once components are confirmed Will  add last 4 CBG results once components are confirmed  Assessment/Plan: S/P Procedure(s) (LRB): MEDIASTINAL EXPLORATION (N/A)  1. Cont IV diuresis, PBNP 25,625 yesterday, re check bmet this am 2. Will give po amiodarone load,    LOS: 40 days    Latondra Gebhart E 1/30/20138:02 AM

## 2011-09-11 NOTE — Progress Notes (Signed)
CARDIAC REHAB PHASE I   PRE:  Rate/Rhythm: 91SR  BP:  Supine: 93/63  Sitting:   Standing:    SaO2: 94%RA  MODE:  Ambulation: 150 ft   POST:  Rate/Rhythem: 103ST  BP:  Supine:   Sitting: 103/59  Standing:    SaO2: 88%RA  91% with rest 1430-1450 Pt very weak during walk. C/o SOB and weakness and had to rest several times. Encouraged pursed-lip breathing. Sats a little low upon return to room. Back to bed.  Warren [name]

## 2011-09-11 NOTE — Progress Notes (Signed)
Physical Therapy Treatment Patient Details Name: Warren [name] MRN: [id number] DOB: Dec 19, 1945 Today's Date: 09/11/2011  PT Assessment/Plan  PT - Assessment/Plan Comments on Treatment Session: Pt has met all goals. Re-educated pt about sternal precautions and pt able to demonstrate these with minimal cueing today. Encouraged pt to continue ambulating with nursing and cardiac rehab.  PT Plan: Discharge plan remains appropriate;All goals met and education completed, patient dischaged from PT services PT Goals  Acute Rehab PT Goals PT Goal: Rolling Supine to Right Side - Progress: Met PT Goal: Rolling Supine to Left Side - Progress: Met PT Goal: Supine/Side to Sit - Progress: Met PT Goal: Sit at Edge Of Bed - Progress: Met PT Goal: Sit to Supine/Side - Progress: Met PT Goal: Sit to Stand - Progress: Met PT Goal: Stand to Sit - Progress: Met PT Goal: Stand - Progress: Met PT Goal: Ambulate - Progress: Met  PT Treatment Precautions/Restrictions  Precautions Precautions: Sternal Precaution Comments: R sided chest tube Restrictions Weight Bearing Restrictions: No Mobility (including Balance) Bed Mobility Supine to Sit: 7: Independent;HOB flat Supine to Sit Details (indicate cue type and reason): with verbal cues for technique prior to pt sitting up pt able to perform a full sit up into long sitting without using upper extremities to assist him; then proceeded to scoot hips around to edge of bed Sitting - Scoot to Edge of Bed: 7: Independent Sit to Supine: 6: Modified independent (Device/Increase time) Transfers Sit to Stand: 6: Modified independent (Device/Increase time) Sit to Stand Details (indicate cue type and reason): pt held cardiac pillow for a reminder for sternal precautions; slower to ascend Stand to Sit: 6: Modified independent (Device/Increase time) Ambulation/Gait Ambulation/Gait Assistance: 6: Modified independent (Device/Increase time) Ambulation/Gait Assistance  Details (indicate cue type and reason): still flexes forward over RW but no obvious LOB noted throughout amb.  Ambulation Distance (Feet): 160 Feet Assistive device: Rolling walker Gait Pattern: Trunk flexed  Posture/Postural Control Posture/Postural Control: No significant limitations Static Sitting Balance Static Sitting - Balance Support: No upper extremity supported Static Sitting - Level of Assistance: 7: Independent Exercise    End of Session PT - End of Session Equipment Utilized During Treatment: Gait belt Activity Tolerance: Patient tolerated treatment well;Treatment limited secondary to medical complications (Comment) (still needed 2 L O2 throughout sats stayed around 88-89) Patient left: in bed;with call bell in reach Nurse Communication: Mobility status for transfers;Mobility status for ambulation General Behavior During Session: [facility] for tasks performed Cognition: [facility] for tasks performed  [facility] [name] 09/11/2011, 1:19 PM

## 2011-09-11 NOTE — Progress Notes (Signed)
Pt ambulated 200 ft with rolling walker on room air.

## 2011-09-12 MED ORDER — POTASSIUM CHLORIDE CRYS ER 20 MEQ PO TBCR
40.0000 meq | EXTENDED_RELEASE_TABLET | Freq: Once | ORAL | Status: AC
  Administered 2011-09-12: 40 meq via ORAL
  Filled 2011-09-12: qty 1

## 2011-09-12 MED ORDER — FUROSEMIDE 40 MG PO TABS
40.0000 mg | ORAL_TABLET | Freq: Every day | ORAL | Status: DC
  Administered 2011-09-12: 40 mg via ORAL
  Filled 2011-09-12 (×2): qty 1

## 2011-09-12 NOTE — Progress Notes (Signed)
CARDIAC REHAB PHASE I   PRE:  Rate/Rhythm: 76SR  BP:  Supine: 107/71  Sitting:   Standing:    SaO2: 95%RA  MODE:  Ambulation: 150 ft   POST:  Rate/Rhythem: 98 SR  BP:  Supine:   Sitting: 115/65  Standing:    SaO2: 96%RA 1253-1315 Pt not feeling well again today. Agreed to take short walk. Ambulated 150 ft on RA with rolling walker and asst x 1. Stopped once to rest. Back to bed.  Warren [name]

## 2011-09-12 NOTE — Progress Notes (Signed)
[address]            [place] [address]          [phone]     14 Days Post-Op  Procedure(s) (LRB): MEDIASTINAL EXPLORATION (N/A) Subjective: Feels better overall  Objective  Telemetry SR,PAC's  Temp:  [97.8 F (36.6 C)-98.1 F (36.7 C)] 97.8 F (36.6 C) (01/31 0426) Pulse Rate:  [86-103] 86  (01/31 0426) Resp:  [20] 20  (01/31 0426) BP: (104-131)/(69-92) 104/73 mmHg (01/31 0426) SpO2:  [89 %-99 %] 99 % (01/31 0426) Weight:  [159 lb 9.8 oz (72.4 kg)] 159 lb 9.8 oz (72.4 kg) (01/31 0500)   Intake/Output Summary (Last 24 hours) at 09/12/11 0827 Last data filed at 09/12/11 0143  Gross per 24 hour  Intake    363 ml  Output   3250 ml  Net  -2887 ml       General appearance: alert and no distress Neurologic: a little confused today , didn't recognize me from previous visits on rounds Heart: slightly irregular, soft systolic murmur, no rub Lungs:  diminished in bases Abdomen: soft, nontender Extremities: no edema Wound: incisions healing well  Lab Results:  Basename 09/11/11 0817  NA 139  K 3.4*  CL 96  CO2 35*  GLUCOSE 108*  BUN 19  CREATININE 1.67*  CALCIUM 8.9  MG --  PHOS --   No results found for this basename: AST:2,ALT:2,ALKPHOS:2,BILITOT:2,PROT:2,ALBUMIN:2 in the last 72 hours No results found for this basename: LIPASE:2,AMYLASE:2 in the last 72 hours No results found for this basename: WBC:2,NEUTROABS:2,HGB:2,HCT:2,MCV:2,PLT:2 in the last 72 hours No results found for this basename: CKTOTAL:4,CKMB:4,TROPONINI:4 in the last 72 hours No components found with this basename: POCBNP:3 No results found for this basename: DDIMER in the last 72 hours No results found for this basename: HGBA1C in the last 72 hours No results found for this basename: CHOL,HDL,LDLCALC,TRIG,CHOLHDL in the last 72 hours No results found for this basename: TSH,T4TOTAL,FREET3,T3FREE,THYROIDAB in the last 72 hours No results found for this  basename: VITAMINB12,FOLATE,FERRITIN,TIBC,IRON,RETICCTPCT in the last 72 hours  Medications: Scheduled    . amiodarone  400 mg Oral BID  . aspirin EC  325 mg Oral Daily  . bisacodyl  10 mg Oral Daily  . feeding supplement  237 mL Oral BID WC  . furosemide  40 mg Intravenous BID  . metoprolol tartrate  75 mg Oral BID  . pantoprazole  40 mg Oral Q1200  . potassium chloride  20 mEq Oral Daily  . sodium chloride  3 mL Intravenous Q12H  . 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  Stable mediastinal and hilar contours.  Pulmonary vascularity is normal. Aeration in the perihilar regions and lung bases is improved on today's study compared to 09/10/2011.  No definite pulmonary edema is visualized on today's study.  There is bibasilar atelectasis, and there are small bilateral pleural effusions.  Subcutaneous emphysema in the shoulder regions and neck bilaterally is without significant change.  Subcutaneous emphysema along the left lateral chest wall is decreasing.  Question remote fracture deformity of the right sixth  and seventh ribs.  IMPRESSION: 1. Improving aeration.  No definite residual pulmonary edema at this time.  2.  Small bilateral pleural effusions and bibasilar atelectasis.  Original Report Authenticated By: [name], M.D.    INR: Will add last result for INR, ABG once components are confirmed Will add last 4 CBG results once components are confirmed  Assessment/Plan: S/P Procedure(s) (LRB): MEDIASTINAL EXPLORATION (N/A)  1. Doing better, will stop iv lasix and go to 40 po daily, replace K+, re check bnp/bmet in am, watch renal function 2. Cont po  amio 3. Wean O2 as able 4. Cont rehab 5. Poss home soon  LOS: 41 days    GOLD,WAYNE E 1/31/20138:27 AM

## 2011-09-12 NOTE — Progress Notes (Signed)
Oxycodone 5mg  tablet wasted in sharps box. Margaret Pyle RN witnessed waste.

## 2011-09-12 NOTE — Progress Notes (Signed)
UR Completed.  Warren [name] Jane [phone] 09/12/2011  

## 2011-09-12 NOTE — Progress Notes (Signed)
Pt complain of SOB and increased incision pain. Vitals were stable both times. No signs of distress. Both were relieved by "talking to him" and pain medicine. Subcutaneous emphysema still present, more so on left side of chest and neck. Will continue to monitor

## 2011-09-12 NOTE — Progress Notes (Signed)
Occupational Therapy Treatment Patient Details Name: CADEN FUKUSHIMA MRN: [id number] DOB: 03-02-1946 Today's Date: 09/12/2011  OT Assessment/Plan OT Assessment/Plan Comments on Treatment Session: Pt has met all goals with no further acute OT indicated at this time. Signing off. OT Plan: Discharge plan remains appropriate Follow Up Recommendations: No OT follow up Equipment Recommended: Tub/shower seat OT Goals ADL Goals ADL Goal: Grooming - Progress: Met ADL Goal: Upper Body Bathing - Progress: Met ADL Goal: Lower Body Bathing - Progress: Met ADL Goal: Upper Body Dressing - Progress: Met ADL Goal: Lower Body Dressing - Progress: Met ADL Goal: Toilet Transfer - Progress: Met ADL Goal: Tub/Shower Transfer - Progress: Met  OT Treatment Precautions/Restrictions  Precautions Precautions: Sternal Precaution Comments: Discussed sternal precautions and how to generalize to ADLs at length with patient. Pt verbalized understanding stating, "Well I'll just try to use my common sense."    ADL ADL Lower Body Dressing: Performed;Independent Where Assessed - Lower Body Dressing: Sit to stand from bed Tub/Shower Transfer: Performed;Modified independent Tub/Shower Transfer Details (indicate cue type and reason): Simulated in room with trash can. Pt demonstrated good balance and safety awareness. Tub/Shower Transfer Method: Ambulating Ambulation Related to ADLs: Mod I with RW ambulation ADL Comments: Educated patient on energy conservation techniques for BADLs and IADLs- provided handout. Pt states he will have ample assist from "good, strong friends" upon d/c.  End of Session  Supine in bed with call bell.   Ferron Ishmael OTR/L 09/12/2011, 9:03 AM

## 2011-09-13 LAB — BASIC METABOLIC PANEL
Calcium: 8.9 mg/dL (ref 8.4–10.5)
GFR calc Af Amer: 40 mL/min — ABNORMAL LOW (ref >90)
GFR calc non Af Amer: 34 mL/min — ABNORMAL LOW (ref >90)
Glucose, Bld: 110 mg/dL — ABNORMAL HIGH (ref 70–99)
Potassium: 3 mEq/L — ABNORMAL LOW (ref 3.5–5.1)
Sodium: 136 mEq/L (ref 135–145)

## 2011-09-13 LAB — PRO B NATRIURETIC PEPTIDE: Pro B Natriuretic peptide (BNP): 12888 pg/mL — ABNORMAL HIGH (ref 0–125)

## 2011-09-13 MED ORDER — POTASSIUM CHLORIDE CRYS ER 10 MEQ PO TBCR: 10.0000 meq | EXTENDED_RELEASE_TABLET | Freq: Every day | ORAL | Status: DC

## 2011-09-13 MED ORDER — POTASSIUM CHLORIDE CRYS ER 20 MEQ PO TBCR
40.0000 meq | EXTENDED_RELEASE_TABLET | Freq: Two times a day (BID) | ORAL | Status: AC
  Administered 2011-09-13 (×2): 40 meq via ORAL
  Filled 2011-09-13 (×2): qty 2

## 2011-09-13 MED ORDER — MAGIC MOUTHWASH W/LIDOCAINE
5.0000 mL | Freq: Three times a day (TID) | ORAL | Status: AC | PRN
  Filled 2011-09-13: qty 5

## 2011-09-13 MED ORDER — ASPIRIN 325 MG PO TBEC: 325.0000 mg | DELAYED_RELEASE_TABLET | Freq: Every day | ORAL | Status: AC

## 2011-09-13 MED ORDER — METOPROLOL TARTRATE 25 MG PO TABS: 75.0000 mg | ORAL_TABLET | Freq: Two times a day (BID) | ORAL | Status: DC

## 2011-09-13 MED ORDER — AMIODARONE HCL 400 MG PO TABS: 400.0000 mg | ORAL_TABLET | Freq: Two times a day (BID) | ORAL | Status: DC

## 2011-09-13 MED ORDER — FUROSEMIDE 40 MG PO TABS: 40.0000 mg | ORAL_TABLET | Freq: Every day | ORAL | Status: DC

## 2011-09-13 MED ORDER — OXYCODONE HCL 5 MG PO TABS: 5.0000 mg | ORAL_TABLET | Freq: Four times a day (QID) | ORAL | Status: AC | PRN

## 2011-09-13 NOTE — Progress Notes (Signed)
Staples d/c'd from midsternal incision per md order and per hospital protocol.  Chest tube sutures d/c'd per md order and per hospital protocol.  Steri Strips applied.  EPW's d/c'd per md order and per hospital protocol.  All ends intact.  Pt reminded to stay in bed for one hour.  Continous VS are now cycling.  Will continue to monitor closely

## 2011-09-13 NOTE — Progress Notes (Signed)
Pt transferred to 2029.  PT ambulated to the room  He walked with a slightly unstable gait.  Pt tolerated well.  Report given to [facility] RN.  Will continue to monitor closely

## 2011-09-13 NOTE — Progress Notes (Signed)
15 Days Post-Op Procedure(s) (LRB): MEDIASTINAL EXPLORATION (N/A) Subjective: Feels better overall today C/o sore spot on tongue from rubbing on mandible Some incisional soreness  Objective: Vital signs in last 24 hours: Temp:  [96.7 F (35.9 C)-98.2 F (36.8 C)] 98 F (36.7 C) (02/01 0530) Pulse Rate:  [72-83] 72  (02/01 0530) Cardiac Rhythm:  [-] Normal sinus rhythm (01/31 1940) Resp:  [12-18] 18  (02/01 0530) BP: (98-119)/(68-77) 115/77 mmHg (02/01 0530) SpO2:  [90 %-96 %] 96 % (02/01 0530) Weight:  [71.759 kg (158 lb 3.2 oz)] 71.759 kg (158 lb 3.2 oz) (02/01 0530)  Hemodynamic parameters for last 24 hours:    Intake/Output from previous day:   Intake/Output this shift:    General appearance: alert and no distress Neurologic: intact Heart: regular rate and rhythm Lungs: clear to auscultation bilaterally Wound: clean and dry, healing well, sternum stable  Lab Results: No results found for this basename: WBC:2,HGB:2,HCT:2,PLT:2 in the last 72 hours BMET:  Basename 09/13/11 0455 09/11/11 0817  NA 136 139  K 3.0* 3.4*  CL 95* 96  CO2 33* 35*  GLUCOSE 110* 108*  BUN 20 19  CREATININE 1.95* 1.67*  CALCIUM 8.9 8.9    PT/INR: No results found for this basename: LABPROT,INR in the last 72 hours ABG    Component Value Date/Time   PHART 7.333* 08/29/2011 1218   HCO3 20.4 08/29/2011 1218   TCO2 21 08/30/2011 1757   ACIDBASEDEF 5.0* 08/29/2011 1218   O2SAT 98.0 08/29/2011 1218   CBG (last 3)  No results found for this basename: GLUCAP:3 in the last 72 hours  Assessment/Plan: S/P Procedure(s) (LRB): MEDIASTINAL EXPLORATION (N/A) - Will ask Dr. Kristin Bruins to evaluate dental issue Has diuresed dramatically over the past 3-4 days, Cr up probably to baseline, changed to PO lasix maintenance. BNP still elevated but 1/2 what it was. I think he's euvolemic at this point. Hypokalemia- supplement D/c staples D/c planning- will d/c on Monday 2/4    LOS: 42 days     Phoenix Dresser C 09/13/2011

## 2011-09-13 NOTE — Progress Notes (Signed)
                   [address]            [place] [address]          [phone]     15 Days Post-Op  Procedure(s) (LRB): MEDIASTINAL EXPLORATION (N/A) Subjective: Looks and feels better, some incisional pain   Objective  Telemetry SR, PAC's, some afib  Temp:  [96.7 F (35.9 C)-98.2 F (36.8 C)] 98 F (36.7 C) (02/01 0530) Pulse Rate:  [72-83] 72  (02/01 0530) Resp:  [12-18] 18  (02/01 0530) BP: (98-119)/(68-77) 115/77 mmHg (02/01 0530) SpO2:  [90 %-96 %] 96 % (02/01 0530) Weight:  [158 lb 3.2 oz (71.759 kg)] 158 lb 3.2 oz (71.759 kg) (02/01 0530)  No intake or output data in the 24 hours ending 09/13/11 0737     General appearance: alert and no distress Heart: irregularly irregular rhythm Lungs: mildly diminished in bases, much clearer Abdomen: soft, nontender Extremities: no edema Wound: incisions healing well  Lab Results:  Basename 09/13/11 0455 09/11/11 0817  NA 136 139  K 3.0* 3.4*  CL 95* 96  CO2 33* 35*  GLUCOSE 110* 108*  BUN 20 19  CREATININE 1.95* 1.67*  CALCIUM 8.9 8.9  MG -- --  PHOS -- --   No results found for this basename: AST:2,ALT:2,ALKPHOS:2,BILITOT:2,PROT:2,ALBUMIN:2 in the last 72 hours No results found for this basename: LIPASE:2,AMYLASE:2 in the last 72 hours No results found for this basename: WBC:2,NEUTROABS:2,HGB:2,HCT:2,MCV:2,PLT:2 in the last 72 hours No results found for this basename: CKTOTAL:4,CKMB:4,TROPONINI:4 in the last 72 hours No components found with this basename: POCBNP:3 No results found for this basename: DDIMER in the last 72 hours No results found for this basename: HGBA1C in the last 72 hours No results found for this basename: CHOL,HDL,LDLCALC,TRIG,CHOLHDL in the last 72 hours No results found for this basename: TSH,T4TOTAL,FREET3,T3FREE,THYROIDAB in the last 72 hours No results found for this basename: VITAMINB12,FOLATE,FERRITIN,TIBC,IRON,RETICCTPCT in the last 72  hours  Medications: Scheduled    . amiodarone  400 mg Oral BID  . aspirin EC  325 mg Oral Daily  . bisacodyl  10 mg Oral Daily  . feeding supplement  237 mL Oral BID WC  . furosemide  40 mg Oral Daily  . metoprolol tartrate  75 mg Oral BID  . pantoprazole  40 mg Oral Q1200  . potassium chloride  40 mEq Oral Once  . sodium chloride  3 mL Intravenous Q12H  . Tamsulosin HCl  0.8 mg Oral QPC breakfast  . DISCONTD: potassium chloride  20 mEq Oral Daily     Radiology/Studies:  No results found.  INR: Will add last result for INR, ABG once components are confirmed Will add last 4 CBG results once components are confirmed  Assessment/Plan: S/P Procedure(s) (LRB): MEDIASTINAL EXPLORATION (N/A)  1. Doing well, hold further lasix at this time with renal fxn worse. PBNP improved from 25000 to 12000, replace k+ 2. See orders 2. Check 12 lead ekg 3. D/c epw's 4.magic mouthwash for mouth pain 5. Home soon?   LOS: 42 days    Sten Dematteo E 2/1/20137:37 AM

## 2011-09-13 NOTE — Progress Notes (Signed)
CARDIAC REHAB PHASE I   PRE:  Rate/Rhythm: 73 SR  BP:  Supine:   Sitting: 100/70  Standing:    SaO2: 96 RA  MODE:  Ambulation: 210 ft   POST:  Rate/Rhythem: 80  BP:  Supine:   Sitting: 96/68  Standing:    SaO2: 94 RA 1215-1235 Assisted X 1 and used walker to ambulate. Gait steady with walker. Only able to get him 210 ft. He states that he is unable to go any more. Complains of incisional pain, ask for pain medication on return to room. He returned to bed with call light in reach.  Beatrix Fetters

## 2011-09-14 LAB — BASIC METABOLIC PANEL
Calcium: 8.8 mg/dL (ref 8.4–10.5)
GFR calc Af Amer: 38 mL/min — ABNORMAL LOW (ref >90)
GFR calc non Af Amer: 33 mL/min — ABNORMAL LOW (ref >90)
Glucose, Bld: 92 mg/dL (ref 70–99)
Potassium: 3.9 mEq/L (ref 3.5–5.1)
Sodium: 139 mEq/L (ref 135–145)

## 2011-09-14 NOTE — Progress Notes (Signed)
Patient ambulated 39ft with rolling walker, assist x 1.  Tolerated well.  Returned to bed.  Will continue to monitor.

## 2011-09-14 NOTE — Progress Notes (Signed)
Patient ambulated 300 ft with rolling walker, assist x 1.  Tolerated well.  Some gout pain in left foot.  Returned to bed, VSS.  Will continue to monitor.

## 2011-09-14 NOTE — Progress Notes (Addendum)
16 Days Post-Op Procedure(s) (LRB): MEDIASTINAL EXPLORATION (N/A)  Subjective: Patient feels better this am. Walking with PT.  Objective: Vital signs in last 24 hours: Patient Vitals for the past 24 hrs:  BP Temp Temp src Pulse Resp SpO2 Weight  09/14/11 0516 117/78 mmHg 97.9 F (36.6 C) Oral 77  18  99 % 161 lb (73.029 kg)  09/13/11 2103 94/62 mmHg 98.4 F (36.9 C) Oral 72  18  94 % -   Pre op weight  82.4 kg Current Weight  09/14/11 161 lb (73.029 kg)        Intake/Output from previous day: 02/01 0701 - 02/02 0700 In: 240 [P.O.:240] Out: 650 [Urine:650]   Physical Exam:  Cardiovascular: RRR, no murmurs, gallops, or rubs. Pulmonary: Clear to auscultation bilaterally; no rales, wheezes, or rhonchi. Abdomen: Soft, non tender, bowel sounds present. Extremities: No lower extremity edema. Wounds: Clean and dry.  No erythema or signs of infection.  Lab Results: CBC:No results found for this basename: WBC:2,HGB:2,HCT:2,PLT:2 in the last 72 hours BMET:  [facility] 09/14/11 0545 09/13/11 0455  NA 139 136  K 3.9 3.0*  CL 99 95*  CO2 32 33*  GLUCOSE 92 110*  BUN 23 20  CREATININE 2.01* 1.95*  CALCIUM 8.8 8.9    PT/INR: No results found for this basename: LABPROT,INR in the last 72 hours ABG:  INR: Will add last result for INR, ABG once components are confirmed Will add last 4 CBG results once components are confirmed  Assessment/Plan:  1. CV -Previous afib.Has been maintaining SR with PVCs. Continue Amiodarone 400 bid and Lopressor 75 bid. 2.  Pulmonary - Continue with incentive spirometer. 3. Volume Overload - Previously diuresed very well. His BNP is down to 12000. Lasix on hold secondary to elevated creatinine.Will discuss if and when should restart. 4.  Acute blood loss anemia -Last H/H stable at 9.8/31.8. 5.History of CKD (stage 3). His baseline appears to be around 2. 6.Possibly discharge Monday or Tuesday.  ZIMMERMAN,DONIELLE MPA-C 09/14/2011   Aiming for  DC Monday if ready I have seen and examined Warren [name] and agree with the above assessment  and plan.  [name] MD Beeper [phone] Office [phone] 09/14/2011 1:24 PM

## 2011-09-14 NOTE — Progress Notes (Signed)
CARDIAC REHAB PHASE I   PRE:  Rate/Rhythm: 87  BP:  Supine:   Sitting: 124/62  Standing:    SaO2: 97 RA  MODE:  Ambulation: 350 ft   POST:  Rate/Rhythem: 93  BP:  Supine:   Sitting: 122/66  Standing:    SaO2: 93 RA  Pt ambulated 39ft with assist x1 using rolling walker.  Pt did have a c/o pain before and after walk with request for more pain meds, RN made aware.  Pt did well and was eager to go today.  We will f/u on Monday. Fabio Pierce, MA, ACSM RCEP [phone]  Hazle Nordmann

## 2011-09-15 LAB — CBC
Hemoglobin: 9.7 g/dL — ABNORMAL LOW (ref 13.0–17.0)
MCH: 24 pg — ABNORMAL LOW (ref 26.0–34.0)
Platelets: 317 10*3/uL (ref 150–400)
RBC: 4.04 MIL/uL — ABNORMAL LOW (ref 4.22–5.81)
WBC: 5.4 10*3/uL (ref 4.0–10.5)

## 2011-09-15 LAB — BASIC METABOLIC PANEL
CO2: 30 mEq/L (ref 19–32)
Calcium: 9.1 mg/dL (ref 8.4–10.5)
Chloride: 99 mEq/L (ref 96–112)
Glucose, Bld: 103 mg/dL — ABNORMAL HIGH (ref 70–99)
Potassium: 3.9 mEq/L (ref 3.5–5.1)
Sodium: 138 mEq/L (ref 135–145)

## 2011-09-15 MED ORDER — FUROSEMIDE 40 MG PO TABS: 40.0000 mg | ORAL_TABLET | Freq: Every day | ORAL | Status: DC

## 2011-09-15 MED ORDER — FUROSEMIDE 40 MG PO TABS
40.0000 mg | ORAL_TABLET | Freq: Every day | ORAL | Status: DC
  Administered 2011-09-15 – 2011-09-17 (×3): 40 mg via ORAL
  Filled 2011-09-15 (×3): qty 1

## 2011-09-15 NOTE — Progress Notes (Addendum)
17 Days Post-Op Procedure(s) (LRB): MEDIASTINAL EXPLORATION (N/A)  Subjective: Patient with a ridge in his mouth that hurts when he eats (tongue rubs across mandible). No difficulty swallowing.  Objective: Vital signs in last 24 hours: Patient Vitals for the past 24 hrs:  BP Temp Temp src Pulse Resp SpO2 Weight  09/15/11 0535 107/74 mmHg 97 F (36.1 C) Oral 65  17  95 % 162 lb 6.4 oz (73.664 kg)  09/14/11 2057 123/82 mmHg 98 F (36.7 C) Oral 77  17  96 % -  09/14/11 1433 115/67 mmHg 97.6 F (36.4 C) Oral 77  16  94 % -   Pre op weight  82.4 kg Current Weight  09/15/11 162 lb 6.4 oz (73.664 kg)      Intake/Output from previous day: 02/02 0701 - 02/03 0700 In: 960 [P.O.:960] Out: -    Physical Exam:  Cardiovascular: RRR, no murmurs, gallops, or rubs. Pulmonary: Clear to auscultation bilaterally; no rales, wheezes, or rhonchi. Abdomen: Soft, non tender, bowel sounds present. Extremities: No lower extremity edema. Wounds: Clean and dry.  No erythema or signs of infection.  Lab Results: CBC:  Basename 09/15/11 0445  WBC 5.4  HGB 9.7*  HCT 31.5*  PLT 317   BMET:   Basename 09/15/11 0445 09/14/11 0545  NA 138 139  K 3.9 3.9  CL 99 99  CO2 30 32  GLUCOSE 103* 92  BUN 23 23  CREATININE 1.83* 2.01*  CALCIUM 9.1 8.8    PT/INR: No results found for this basename: LABPROT,INR in the last 72 hours ABG:  INR: Will add last result for INR, ABG once components are confirmed Will add last 4 CBG results once components are confirmed  Assessment/Plan:  1. CV -Previous afib.Has been maintaining SR with PVCs. Continue Amiodarone 400 bid and Lopressor 75 bid. 2.  Pulmonary - Continue with incentive spirometer. 3. Volume Overload - Previously diuresed very well. His BNP is down to 12000. Lasix on hold secondary to elevated creatinine.Will restart Lasix 40 mg po daily. 4.  Acute blood loss anemia -Last H/H stable at 9.7/31.5 5.History of CKD (stage 3). His baseline  appears to be around 2. Cr decreased to 1.83 this am. 6.Will ask Dr. Kristin Bruins to evaluate teeth/ridge in mouth in am. 7.Per patient has transportation available Tuesday, not Monday.   ZIMMERMAN,Warren [name] 09/15/2011   09/15/2011 9:37 AM   Home Tue, no ride on Monday I have seen and examined Warren [name] and agree with the above assessment  and plan.  [name] MD Beeper [phone] Office [phone] 09/15/2011 12:02 PM

## 2011-09-16 DIAGNOSIS — Z954 Presence of other heart-valve replacement: Secondary | ICD-10-CM

## 2011-09-16 DIAGNOSIS — I359 Nonrheumatic aortic valve disorder, unspecified: Secondary | ICD-10-CM

## 2011-09-16 LAB — BASIC METABOLIC PANEL
BUN: 21 mg/dL (ref 6–23)
Chloride: 95 mEq/L — ABNORMAL LOW (ref 96–112)
GFR calc Af Amer: 40 mL/min — ABNORMAL LOW (ref >90)
GFR calc non Af Amer: 35 mL/min — ABNORMAL LOW (ref >90)
Glucose, Bld: 104 mg/dL — ABNORMAL HIGH (ref 70–99)
Potassium: 3.7 mEq/L (ref 3.5–5.1)
Sodium: 133 mEq/L — ABNORMAL LOW (ref 135–145)

## 2011-09-16 MED ORDER — AMIODARONE HCL 200 MG PO TABS
400.0000 mg | ORAL_TABLET | Freq: Every day | ORAL | Status: DC
  Administered 2011-09-17: 400 mg via ORAL
  Filled 2011-09-16: qty 2

## 2011-09-16 MED ORDER — MAGIC MOUTHWASH: 5.0000 mL | Freq: Three times a day (TID) | ORAL | Status: DC

## 2011-09-16 MED ORDER — AMIODARONE HCL 400 MG PO TABS: 400.0000 mg | ORAL_TABLET | Freq: Every day | ORAL | Status: DC

## 2011-09-16 MED ORDER — MENTHOL 3 MG MT LOZG
1.0000 | LOZENGE | OROMUCOSAL | Status: DC | PRN
  Administered 2011-09-16: 3 mg via ORAL
  Filled 2011-09-16: qty 9

## 2011-09-16 MED ORDER — CHLORHEXIDINE GLUCONATE 0.12 % MT SOLN
15.0000 mL | Freq: Three times a day (TID) | OROMUCOSAL | Status: DC
  Administered 2011-09-16 – 2011-09-17 (×3): 15 mL via OROMUCOSAL
  Filled 2011-09-16 (×5): qty 15

## 2011-09-16 MED ORDER — MENTHOL 3 MG MT LOZG: 1.0000 | LOZENGE | OROMUCOSAL | Status: DC | PRN

## 2011-09-16 MED ORDER — MAGIC MOUTHWASH
5.0000 mL | Freq: Three times a day (TID) | ORAL | Status: DC
  Administered 2011-09-16 – 2011-09-17 (×3): 5 mL via ORAL
  Filled 2011-09-16 (×5): qty 5

## 2011-09-16 NOTE — Progress Notes (Addendum)
                   [address]            [place] [address]          [phone]     18 Days Post-Op  Procedure(s) (LRB): MEDIASTINAL EXPLORATION (N/A) Subjective: Feels ok, mouth pain persists  Objective  Telemetry SR, PAC'S, 60's   Temp:  [97.9 F (36.6 C)-98.2 F (36.8 C)] 97.9 F (36.6 C) (02/04 0553) Pulse Rate:  [64-76] 68  (02/04 1028) Resp:  [16-18] 18  (02/04 0553) BP: (92-126)/(60-77) 92/60 mmHg (02/04 1028) SpO2:  [94 %-97 %] 94 % (02/04 0553) Weight:  [157 lb 14.4 oz (71.623 kg)] 157 lb 14.4 oz (71.623 kg) (02/04 0553)   Intake/Output Summary (Last 24 hours) at 09/16/11 1136 Last data filed at 09/16/11 0842  Gross per 24 hour  Intake    360 ml  Output      0 ml  Net    360 ml       General appearance: alert, cooperative and no distress Heart: irregularly irregular rhythm Lungs: clear to auscultation bilaterally Abdomen: soft, non-tender; bowel sounds normal; no masses,  no organomegaly Extremities: no edema, redness or tenderness in the calves or thighs Wound: incisions healing well  Lab Results:  Basename 09/16/11 0549 09/15/11 0445  NA 133* 138  K 3.7 3.9  CL 95* 99  CO2 31 30  GLUCOSE 104* 103*  BUN 21 23  CREATININE 1.94* 1.83*  CALCIUM 9.3 9.1  MG -- --  PHOS -- --   No results found for this basename: AST:2,ALT:2,ALKPHOS:2,BILITOT:2,PROT:2,ALBUMIN:2 in the last 72 hours No results found for this basename: LIPASE:2,AMYLASE:2 in the last 72 hours  Basename 09/15/11 0445  WBC 5.4  NEUTROABS --  HGB 9.7*  HCT 31.5*  MCV 78.0  PLT 317   No results found for this basename: CKTOTAL:4,CKMB:4,TROPONINI:4 in the last 72 hours No components found with this basename: POCBNP:3 No results found for this basename: DDIMER in the last 72 hours No results found for this basename: HGBA1C in the last 72 hours No results found for this basename: CHOL,HDL,LDLCALC,TRIG,CHOLHDL in the last 72 hours No results found for this  basename: TSH,T4TOTAL,FREET3,T3FREE,THYROIDAB in the last 72 hours No results found for this basename: VITAMINB12,FOLATE,FERRITIN,TIBC,IRON,RETICCTPCT in the last 72 hours  Medications: Scheduled    . amiodarone  400 mg Oral BID  . aspirin EC  325 mg Oral Daily  . bisacodyl  10 mg Oral Daily  . feeding supplement  237 mL Oral BID WC  . furosemide  40 mg Oral Daily  . metoprolol tartrate  75 mg Oral BID  . pantoprazole  40 mg Oral Q1200  . sodium chloride  3 mL Intravenous Q12H  . Tamsulosin HCl  0.8 mg Oral QPC breakfast     Radiology/Studies:  No results found.  INR: Will add last result for INR, ABG once components are confirmed Will add last 4 CBG results once components are confirmed  Assessment/Plan: S/P Procedure(s) (LRB): MEDIASTINAL EXPLORATION (N/A) 1. Doing well, poss home in am  2 decrease amio 3. Will tru to contact dentist   LOS: 45 days    GOLD,WAYNE E 2/4/201311:36 AM    Pt seen and examined. Agree with above. Will arrange home RN and PT

## 2011-09-16 NOTE — Plan of Care (Signed)
Problem: Phase I Progression Outcomes Goal: Consult Dental (valve) if indicated Outcome: Progressing MD to notify Dentist to assess patient while in hospital. Warren [name] 09/16/2011

## 2011-09-16 NOTE — Progress Notes (Signed)
POST OPERATIVE NOTE:  09/16/2011 Warren [name] [id number]  VITALS: BP 100/62  Pulse 63  Temp(Src) 97.8 F (36.6 C) (Oral)  Resp 18  Ht 6\' 3"  (1.905 m)  Wt 157 lb 14.4 oz (71.623 kg)  BMI 19.74 kg/m2  SpO2 3%  Warren [name] is status post extraction of remaining teeth with alveoloplasty on 08/19/11.  SUBJECTIVE: Patient is complaining of left alteral tongue irritation secondary to rubbing on bone.  EXAM: Patient is edentulous. Patient has exposed bone on the left lateral alveolar ridge in there retromolar region. Patient also has a traumatic ulceration of his left alteral tongue consistent with trauma to this area by the exposed bone.  ASSESSMENT: Post operative course is now complicated by exposed bone and subsequent trauma to the left lateral tongueconsistent with dental procedures performed.  PLAN: 1. Will need to perform partial ostectomy/sequestrectomy in the Dental Medicine clinic as an Outpatient. Will discuss timing with Dr. Dorris Fetch. Patient will need antibiotic premedication for this procedure per A.H.A. Guidelines. In meantime, use Chlorhexidine rinses three times daily.  2. Discussion with Dr. Dorris Fetch as indicated.   Charlynne Pander, DDS
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
## 2011-09-16 NOTE — Progress Notes (Signed)
CARDIAC REHAB PHASE I   PRE:  Rate/Rhythm: 64 SR  BP:  Supine:   Sitting: 96/60  Standing:    SaO2: 96 RA  MODE:  Ambulation: 500 ft   POST:  Rate/Rhythem: 75  BP:  Supine:   Sitting: 110/60  Standing:    SaO2: 95 RA 1040-1110  Assisted X 1 and used walker to ambulate. Gait steady with walker. Able to get him 500 ft today! Does c/o of being SOB and of incisional pain by end of walk. Pt to bed after walk with call light in reach.  Warren [name]

## 2011-09-16 NOTE — Progress Notes (Addendum)
   SUBJECTIVE:  Still with oral pain but no new SOB.   PHYSICAL EXAM Filed Vitals:   09/15/11 2119 09/15/11 2121 09/15/11 2349 09/16/11 0553  BP: 126/77 126/77 124/77 113/69  Pulse: 68 68 73 76  Temp:  98.2 F (36.8 C)  97.9 F (36.6 C)  TempSrc:  Oral  Oral  Resp:  17 18 18   Height:      Weight:    71.623 kg (157 lb 14.4 oz)  SpO2:  97% 97% 94%   General:  No distress Lungs:  Clear Heart:  RRR, systolic murmur Abdomen:  Positive bowel sounds, no rebound no guarding Extremities:  No edema.  LABS:  Results for orders placed during the hospital encounter of 08/02/11 (from the past 24 hour(s))  BASIC METABOLIC PANEL     Status: Abnormal   Collection Time   09/16/11  5:49 AM      Component Value Range   Sodium 133 (*) 135 - 145 (mEq/L)   Potassium 3.7  3.5 - 5.1 (mEq/L)   Chloride 95 (*) 96 - 112 (mEq/L)   CO2 31  19 - 32 (mEq/L)   Glucose, Bld 104 (*) 70 - 99 (mg/dL)   BUN 21  6 - 23 (mg/dL)   Creatinine, Ser 0.27 (*) 0.50 - 1.35 (mg/dL)   Calcium 9.3  8.4 - 25.3 (mg/dL)   GFR calc non Af Amer 35 (*) >90 (mL/min)   GFR calc Af Amer 40 (*) >90 (mL/min)    Intake/Output Summary (Last 24 hours) at 09/16/11 0857 Last data filed at 09/16/11 0842  Gross per 24 hour  Intake    360 ml  Output      0 ml  Net    360 ml    ASSESSMENT AND PLAN:  1. S/p AVR/MR-  We will arrange follow up in our office  2. Dyspnea-  I agree with once daily Lasix.  3. Atrial fibrillation   I will follow up and adjust amiodarone as an outpatient.    Rollene Rotunda 09/16/2011 8:57 AM

## 2011-09-16 NOTE — Progress Notes (Signed)
   CARE MANAGEMENT NOTE 09/16/2011  Patient:  PIERCE, BAROCIO   Account Number:  [id number]  Date Initiated:  08/29/2011  Documentation initiated by:  [facility]  Subjective/Objective Assessment:   post op 08-28-11 - AVR and AVR.     Action/Plan:   PTA, PT LIVES ALONE AND IS INDEPENDENT OF ADLS.   Anticipated DC Date:  09/16/2011   Anticipated DC Plan:  HOME W HOME HEALTH SERVICES  In-house referral  Clinical Social Worker      DC Planning Services  CM consult      Choice offered to / List presented to:     DME arranged  [facility]      DME agency  [facility]     HH arranged  HH-1 RN  HH-2 PT      [facility] agency  [facility]   Status of service:  In process, will continue to follow Medicare Important Message given?   (If response is "NO", the following Medicare IM given date fields will be blank) Date Medicare IM given:   Date Additional Medicare IM given:    Discharge Disposition:  HOME W HOME HEALTH SERVICES  Per UR Regulation:  Reviewed for med. necessity/level of care/duration of stay  Comments:  09/16/11 Raymont Andreoni,RN,BSN 1543 PT NOW AGREEABLE TO HOME CARE.  WILL ARRANGE HOME HEALTH RN FOR RESTORATIVE CARE AND PT SAFETY EVAL AT DISCHARGE. REFERRAL TO [facility] FOR FOLLOW UP.  START OF CARE 24-48H POST DC DATE.  REQUESTS RW FOR HOME.  [facility] TO DELIVER TO ROOM PRIOR TO DC HOME. Phone #[phone]   09/09/11 Yeimy Brabant,RN,BSN 1145 MET WITH PT TO DISCUSS DC PLANS.  PT STATES HIS FRIENDS WILL PROVIDE MEALS AND SUPERVISION AT DISCHARGE.  HE REQUESTS SHOWER CHAIR FOR HOME, BUT IS NOT AGREEABLE TO HHRN FOR RESTORATIVE CARE.  STATES "I AM VERY INDEPENDENT, AND DON'T NEED ANY HELP."  PT INSTRUCTED TO CALL MD OFFICE IF HE CHANGES HIS MIND ABOUT HOME HEALTH SERVICES. Phone #[phone]  09-09-11 9:50am Avie Arenas, RNBSN - [phone] UR completed.  09-05-11 8:30am Avie Arenas, RNBSN [phone] UR completed.  09/04/11 Aryani Daffern,RN,BSN 1530 PATIENT HAS BEEN EVALUATED FOR INPT REHAB, BUT HAS NOW PROGRESSED TO WHERE HE CAN LIKELY GO HOME WITH HOME HEALTH CARE.  HE LIVES ALONE, BUT STATES HE HAS FRIENDS THAT CAN PROVIDE 24HR CARE AT DISCHARGE.  HE STATES THAT HE WILL CONFIRM THAT FRIENDS CAN STAY WITH HIM FOR 24H/DAY X 7-10 DAYS AT DC.  HE IS AGREEABLE TO HOME HEALTH FOLLOW UP, IF NEEDED, AND STATES HE HAS ALL NEEDED EQUIPMENT AT HOME. WILL FOLLOW . Phone #[phone]    09-03-11 10:50am Avie Arenas, RNBSN - [phone] UR Completed.  08-29-11 11:35am Avie Arenas,  RNBSN [phone] UR Completed.

## 2011-09-17 ENCOUNTER — Other Ambulatory Visit (HOSPITAL_COMMUNITY): Payer: Self-pay | Admitting: Dentistry

## 2011-09-17 MED ORDER — CHLORHEXIDINE GLUCONATE 0.12 % MT SOLN: 15.0000 mL | Freq: Three times a day (TID) | OROMUCOSAL | Status: AC

## 2011-09-17 NOTE — Progress Notes (Signed)
Pt given discharge instructions, medication lists, follow up appointments, when to call the doctor and s/sx of infection.  Pt verbalizes understanding of instructions. Thomas Hoff

## 2011-09-17 NOTE — Progress Notes (Signed)
   CARE MANAGEMENT NOTE 09/17/2011  Patient:  Warren [name], Warren [name]   Account Number:  [id number]  Date Initiated:  08/29/2011  Documentation initiated by:  [facility]  Subjective/Objective Assessment:   post op 08-28-11 - AVR and AVR.     Action/Plan:   PTA, PT LIVES ALONE AND IS INDEPENDENT OF ADLS.   Anticipated DC Date:  09/16/2011   Anticipated DC Plan:  HOME W HOME HEALTH SERVICES  In-house referral  Clinical Social Worker      DC Planning Services  CM consult      Choice offered to / List presented to:     DME arranged  [facility]  [facility]      DME agency  [facility]     HH arranged  HH-1 RN  HH-2 PT      [facility] agency  [facility]   Status of service:  Completed, signed off Medicare Important Message given?   (If response is "NO", the following Medicare IM given date fields will be blank) Date Medicare IM given:   Date Additional Medicare IM given:    Discharge Disposition:  HOME W HOME HEALTH SERVICES  Per UR Regulation:  Reviewed for med. necessity/level of care/duration of stay  Comments:  09/17/11 Mayara Paulson,RN,BSN 1200 PT FOR DISCHARGE HOME TODAY.  NOTIFIED [facility] OF DC DATE.  09/16/11 Shazia Mitchener,RN,BSN 1543 PT NOW AGREEABLE TO HOME CARE.  WILL ARRANGE HOME HEALTH RN FOR RESTORATIVE CARE AND PT SAFETY EVAL AT DISCHARGE. REFERRAL TO [facility] FOR FOLLOW UP.  START OF CARE 24-48H POST DC DATE.  REQUESTS RW FOR HOME.  [facility] TO DELIVER TO ROOM PRIOR TO DC HOME.  09/09/11 Tayton Decaire,RN,BSN 1145 MET WITH PT TO DISCUSS DC PLANS.  PT STATES HIS FRIENDS WILL PROVIDE MEALS AND SUPERVISION AT DISCHARGE.  HE REQUESTS SHOWER CHAIR FOR HOME, BUT IS NOT AGREEABLE TO HHRN FOR RESTORATIVE CARE.  STATES "I AM VERY INDEPENDENT, AND DON'T NEED ANY HELP."  PT INSTRUCTED TO CALL MD OFFICE IF HE CHANGES HIS MIND ABOUT HOME HEALTH SERVICES.  09-09-11 9:50am Avie Arenas, RNBSN [phone] UR completed.  09-05-11 8:30am Avie Arenas, RNBSN [phone] UR completed.  09/04/11 Tashayla Therien,RN,BSN 1530 PATIENT HAS BEEN EVALUATED FOR INPT REHAB, BUT HAS NOW PROGRESSED TO WHERE HE CAN LIKELY GO HOME WITH HOME HEALTH CARE.  HE LIVES ALONE, BUT STATES HE HAS FRIENDS THAT CAN PROVIDE 24HR CARE AT DISCHARGE.  HE STATES THAT HE WILL CONFIRM THAT FRIENDS CAN STAY WITH HIM FOR 24H/DAY X 7-10 DAYS AT DC.  HE IS AGREEABLE TO HOME HEALTH FOLLOW UP, IF NEEDED, AND STATES HE HAS ALL NEEDED EQUIPMENT AT HOME. WILL FOLLOW . Phone #[phone]    09-03-11 10:50am Avie Arenas, RNBSN - [phone] UR Completed.  08-29-11 11:35am Avie Arenas,  RNBSN [phone] UR Completed.

## 2011-09-17 NOTE — Progress Notes (Signed)
Cardiac Rehab [id number] Education completed with pt. Permission given to refer to [facility] Phase 2. Birtie Fellman DunlapRN

## 2011-09-17 NOTE — Progress Notes (Addendum)
                   [address]            [place] [address]          [phone]     19 Days Post-Op  Procedure(s) (LRB): MEDIASTINAL EXPLORATION (N/A) Subjective: Feels well, anxious to go home  Objective  Telemetry SR/Sbrady, some bigemminy  Temp:  [97 F (36.1 C)-98.1 F (36.7 C)] 97 F (36.1 C) (02/05 0535) Pulse Rate:  [51-68] 51  (02/05 0535) Resp:  [18-20] 18  (02/05 0535) BP: (92-132)/(60-79) 132/79 mmHg (02/05 0535) SpO2:  [3 %-94 %] 93 % (02/05 0535) Weight:  [157 lb 3 oz (71.3 kg)] 157 lb 3 oz (71.3 kg) (02/05 0535)   Intake/Output Summary (Last 24 hours) at 09/17/11 0740 Last data filed at 09/16/11 1700  Gross per 24 hour  Intake    720 ml  Output    300 ml  Net    420 ml       General appearance: alert and no distress Heart: S1, S2 normal and + rub Lungs: clear to auscultation bilaterally Abdomen: soft, non-tender; bowel sounds normal; no masses,  no organomegaly Extremities: no edema Wound: incisions all healing well  Lab Results:  Basename 09/16/11 0549 09/15/11 0445  NA 133* 138  K 3.7 3.9  CL 95* 99  CO2 31 30  GLUCOSE 104* 103*  BUN 21 23  CREATININE 1.94* 1.83*  CALCIUM 9.3 9.1  MG -- --  PHOS -- --   No results found for this basename: AST:2,ALT:2,ALKPHOS:2,BILITOT:2,PROT:2,ALBUMIN:2 in the last 72 hours No results found for this basename: LIPASE:2,AMYLASE:2 in the last 72 hours  Basename 09/15/11 0445  WBC 5.4  NEUTROABS --  HGB 9.7*  HCT 31.5*  MCV 78.0  PLT 317   No results found for this basename: CKTOTAL:4,CKMB:4,TROPONINI:4 in the last 72 hours No components found with this basename: POCBNP:3 No results found for this basename: DDIMER in the last 72 hours No results found for this basename: HGBA1C in the last 72 hours No results found for this basename: CHOL,HDL,LDLCALC,TRIG,CHOLHDL in the last 72 hours No results found for this basename: TSH,T4TOTAL,FREET3,T3FREE,THYROIDAB in the last 72 hours No  results found for this basename: VITAMINB12,FOLATE,FERRITIN,TIBC,IRON,RETICCTPCT in the last 72 hours  Medications: Scheduled    . amiodarone  400 mg Oral Daily  . aspirin EC  325 mg Oral Daily  . bisacodyl  10 mg Oral Daily  . chlorhexidine  15 mL Mouth/Throat TID  . feeding supplement  237 mL Oral BID WC  . furosemide  40 mg Oral Daily  . magic mouthwash  5 mL Oral TID  . metoprolol tartrate  75 mg Oral BID  . pantoprazole  40 mg Oral Q1200  . sodium chloride  3 mL Intravenous Q12H  . Tamsulosin HCl  0.8 mg Oral QPC breakfast  . DISCONTD: amiodarone  400 mg Oral BID     Radiology/Studies:  No results found.  INR: Will add last result for INR, ABG once components are confirmed Will add last 4 CBG results once components are confirmed  Assessment/Plan: S/P Procedure(s) (LRB): MEDIASTINAL EXPLORATION (N/A) Doing well, have asked cardiology to review rhythm and meds. D/C home if they agree. Addendum : spoke with Tony(PA) who spoke with Dr Antoine Poche and they are ok for d/c on amiodarone 400 mg daily  LOS: 46 days    Kerrion Kemppainen E 2/5/20137:40 AM

## 2011-09-26 ENCOUNTER — Ambulatory Visit (HOSPITAL_COMMUNITY): Payer: Self-pay | Admitting: Dentistry

## 2011-09-27 DIAGNOSIS — I5023 Acute on chronic systolic (congestive) heart failure: Secondary | ICD-10-CM

## 2011-09-27 DIAGNOSIS — R0602 Shortness of breath: Secondary | ICD-10-CM

## 2011-09-27 DIAGNOSIS — R079 Chest pain, unspecified: Secondary | ICD-10-CM

## 2011-09-29 DIAGNOSIS — I7100 Dissection of unspecified site of aorta: Secondary | ICD-10-CM

## 2011-09-29 DIAGNOSIS — R079 Chest pain, unspecified: Secondary | ICD-10-CM

## 2011-09-29 DIAGNOSIS — I359 Nonrheumatic aortic valve disorder, unspecified: Secondary | ICD-10-CM

## 2011-10-02 ENCOUNTER — Other Ambulatory Visit: Payer: Self-pay | Admitting: Thoracic Surgery (Cardiothoracic Vascular Surgery)

## 2011-10-02 DIAGNOSIS — I059 Rheumatic mitral valve disease, unspecified: Secondary | ICD-10-CM

## 2011-10-08 ENCOUNTER — Ambulatory Visit: Payer: Medicare Other | Admitting: Thoracic Surgery (Cardiothoracic Vascular Surgery)

## 2011-10-14 ENCOUNTER — Encounter (HOSPITAL_COMMUNITY): Payer: Self-pay | Admitting: Dentistry

## 2011-10-14 ENCOUNTER — Other Ambulatory Visit: Payer: Self-pay | Admitting: Thoracic Surgery (Cardiothoracic Vascular Surgery)

## 2011-10-14 ENCOUNTER — Ambulatory Visit (HOSPITAL_COMMUNITY): Payer: Self-pay | Admitting: Dentistry

## 2011-10-14 VITALS — BP 149/74 | HR 78 | Temp 97.5°F

## 2011-10-14 DIAGNOSIS — I059 Rheumatic mitral valve disease, unspecified: Secondary | ICD-10-CM

## 2011-10-14 DIAGNOSIS — Z9889 Other specified postprocedural states: Secondary | ICD-10-CM

## 2011-10-14 DIAGNOSIS — M272 Inflammatory conditions of jaws: Secondary | ICD-10-CM

## 2011-10-14 DIAGNOSIS — I359 Nonrheumatic aortic valve disorder, unspecified: Secondary | ICD-10-CM

## 2011-10-14 DIAGNOSIS — Z954 Presence of other heart-valve replacement: Secondary | ICD-10-CM
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
## 2011-10-15 ENCOUNTER — Ambulatory Visit: Payer: Self-pay | Admitting: Thoracic Surgery (Cardiothoracic Vascular Surgery)

## 2011-10-18 ENCOUNTER — Other Ambulatory Visit: Payer: Self-pay | Admitting: *Deleted

## 2011-10-18 ENCOUNTER — Ambulatory Visit
Admission: RE | Admit: 2011-10-18 | Discharge: 2011-10-18 | Disposition: A | Discharge status: Home / Self Care | Payer: Medicare Other | Source: Ambulatory Visit | Attending: Thoracic Surgery (Cardiothoracic Vascular Surgery) | Admitting: Thoracic Surgery (Cardiothoracic Vascular Surgery)

## 2011-10-18 ENCOUNTER — Ambulatory Visit (INDEPENDENT_AMBULATORY_CARE_PROVIDER_SITE_OTHER): Payer: Self-pay | Admitting: Thoracic Surgery (Cardiothoracic Vascular Surgery)

## 2011-10-18 ENCOUNTER — Encounter: Payer: Self-pay | Admitting: Thoracic Surgery (Cardiothoracic Vascular Surgery)

## 2011-10-18 VITALS — BP 139/74 | HR 76 | Resp 20 | Ht 75.0 in | Wt 175.0 lb

## 2011-10-18 DIAGNOSIS — I340 Nonrheumatic mitral (valve) insufficiency: Secondary | ICD-10-CM

## 2011-10-18 DIAGNOSIS — I359 Nonrheumatic aortic valve disorder, unspecified: Secondary | ICD-10-CM

## 2011-10-18 DIAGNOSIS — Z9889 Other specified postprocedural states: Secondary | ICD-10-CM

## 2011-10-18 DIAGNOSIS — I351 Nonrheumatic aortic (valve) insufficiency: Secondary | ICD-10-CM

## 2011-10-18 DIAGNOSIS — I059 Rheumatic mitral valve disease, unspecified: Secondary | ICD-10-CM

## 2011-10-18 DIAGNOSIS — Z952 Presence of prosthetic heart valve: Secondary | ICD-10-CM

## 2011-10-18 DIAGNOSIS — Z954 Presence of other heart-valve replacement: Secondary | ICD-10-CM

## 2011-10-18 NOTE — Progress Notes (Signed)
  HPI:  Mr. Straka returns for a scheduled postoperative followup visit. He had previously had an aortic dissection which was repaired preserving his aortic valve. Unfortunately subsequent developed aortic insufficiency. He underwent redo sternotomy aortic valve replacement and mitral valve repair on January 16. He go back to the operating room the following day to have his Swan-Ganz catheter removed as it had been caught up in one of the cannulation sutures. He originally presented with congestive heart failure and renal failure. He also needed his teeth extracted preoperatively. He had a rather prolonged course prior to surgery, and a relatively slow recovery after surgery.  He states that he's been doing well recently. He has a little bit of soreness but is not taking any narcotics. His exercise tolerance is good and continues to improve. He has run out of Flomax, and also complains of difficulty sleeping. But all in all feels well, denies shortness of breath, orthopnea, and peripheral edema.   Current Outpatient Prescriptions  Medication Sig Dispense Refill  . ALPRAZolam (XANAX) 0.5 MG tablet Take 0.5 mg by mouth 3 (three) times daily as needed. For anxiety.      Marland Kitchen amiodarone (PACERONE) 400 MG tablet Take 1 tablet (400 mg total) by mouth daily.  30 tablet  1  . furosemide (LASIX) 40 MG tablet Take 1 tablet (40 mg total) by mouth daily.  30 tablet  1  . metoprolol tartrate (LOPRESSOR) 25 MG tablet Take 3 tablets (75 mg total) by mouth 2 (two) times daily.  180 tablet  1  . omeprazole (PRILOSEC) 20 MG capsule Take 20 mg by mouth daily.       . Tamsulosin HCl (FLOMAX) 0.4 MG CAPS Take 0.4 mg by mouth daily.          Physical Exam BP 139/74  Pulse 76  Resp 20  Ht 6\' 3"  (1.905 m)  Wt 175 lb (79.379 kg)  BMI 21.87 kg/m2  SpO2 97% Gen. well-appearing 66 year old male in no acute distress Lungs clear with equal breath sounds bilaterally Cardiac regular rate and rhythm 2/6 systolic murmur right  upper sternal border Sternum stable, incision clean dry and intact No peripheral edema  Diagnostic Tests: CXR- small right pleural effusion  Impression: Mr. Elko is doing extremely well at this point in time after a complicated hospital course and difficult operation. His exercise tolerance is good and he is having minimal discomfort. He is having some urinary hesitancy and frequency and needs to have his Flomax restarted. He also is having difficulty sleeping and is requesting a sleeping aid. He wishes to have his medications prescribed through the [facility]. I gave him a prescription for each of these medications for a week, and we will call prescriptions in to the [place].  From a surgical standpoint he's healing well. There are no restrictions on his activities, but he was advised to build into his activities gradually.  Plan:  We will continue to be followed by [facility] cardiology in [place].  I will be happy to see him back any time the future that can be of any further assistance with his care

## 2011-10-18 NOTE — Telephone Encounter (Signed)
ERRONEOUS ENCOUNTER

## 2011-10-24 ENCOUNTER — Encounter: Payer: Self-pay | Admitting: Cardiology

## 2011-11-01 ENCOUNTER — Other Ambulatory Visit: Payer: Self-pay

## 2011-11-05 ENCOUNTER — Other Ambulatory Visit: Payer: Self-pay

## 2011-11-05 DIAGNOSIS — G4700 Insomnia, unspecified: Secondary | ICD-10-CM

## 2011-11-05 DIAGNOSIS — G8918 Other acute postprocedural pain: Secondary | ICD-10-CM

## 2011-11-05 MED ORDER — ZOLPIDEM TARTRATE 10 MG PO TABS: 10.0000 mg | ORAL_TABLET | Freq: Every evening | ORAL | Status: AC | PRN

## 2011-11-05 NOTE — Telephone Encounter (Signed)
Pt receives his RX's from the [place] in [place] ( they will only take written RX's) no phone-in. The Ambien 10 mg po every hs #30/1 refill was mailed to pt's home address.

## 2011-11-11 ENCOUNTER — Ambulatory Visit (HOSPITAL_COMMUNITY): Payer: Self-pay | Admitting: Dentistry

## 2011-11-11 VITALS — BP 144/73 | HR 80 | Temp 98.4°F

## 2011-11-11 DIAGNOSIS — K08109 Complete loss of teeth, unspecified cause, unspecified class: Secondary | ICD-10-CM

## 2011-11-11 NOTE — Progress Notes (Signed)
Monday, November 11, 2011   BP:  144/73            P:   80           T:   98.4  Warren [name] presents for start of upper and lower complete denture fabrication. Exam: Patient is edentulous.  Discussed procedures involved in upper and lower complete denture fabrication and prognosis for successful ability to wear dentures. Price for dentures confirmed. Patient agrees to proceed with upper and lower complete denture fabrication. Patient does NOT have Medicaid at this time and agrees to be responsible for payment for dentures and wants to start denture fabrication today. Procedure: Upper and lower complete denture primary impressions in alginate. Lab pour. To [facility] for custom impression tray fabrication. RTC for upper and lower complete denture final impressions. Dr. Cindra Eves

## 2011-11-22 ENCOUNTER — Encounter (HOSPITAL_COMMUNITY): Payer: Self-pay | Admitting: *Deleted

## 2011-11-25 ENCOUNTER — Ambulatory Visit (HOSPITAL_COMMUNITY): Payer: Self-pay | Admitting: Dentistry

## 2011-11-25 VITALS — BP 138/71 | HR 54 | Temp 98.7°F

## 2011-11-25 DIAGNOSIS — Z463 Encounter for fitting and adjustment of dental prosthetic device: Secondary | ICD-10-CM

## 2011-11-25 DIAGNOSIS — K08109 Complete loss of teeth, unspecified cause, unspecified class: Secondary | ICD-10-CM

## 2011-11-25 NOTE — Progress Notes (Signed)
Monday, November 25, 2011   BP: 138/71           P:  54                T:  98.7   Warren [name] presents for continued upper and lower complete denture fabrication. Procedure: Upper and lower complete denture border molding and final impressions in Aquasil. Patient tolerated procedure well. To [facility] for custom baseplates with rims. Return to clinic for upper and lower complete denture jaw relations.  Dr. Cindra Eves

## 2011-12-04 ENCOUNTER — Ambulatory Visit (HOSPITAL_COMMUNITY): Payer: Self-pay | Admitting: Dentistry

## 2011-12-04 VITALS — BP 118/63 | HR 69 | Temp 97.4°F

## 2011-12-04 DIAGNOSIS — Z463 Encounter for fitting and adjustment of dental prosthetic device: Secondary | ICD-10-CM

## 2011-12-04 DIAGNOSIS — K08109 Complete loss of teeth, unspecified cause, unspecified class: Secondary | ICD-10-CM

## 2011-12-04 NOTE — Progress Notes (Signed)
BP 118/63  Pulse 69  Temp(Src) 97.4 F (36.3 C) (Oral)  Warren [name] presents for continued denture fabrication. Procedure:  Upper and lower denture Jaw relations with aluwax bite registration. Patient agrees to tooth selection of 21X, P, and 10 degree posteriors to match with Portrait A2 shade. Patient tolerated procedure well. RTC for denture wax try in. Charlynne Pander

## 2011-12-12 ENCOUNTER — Encounter (HOSPITAL_COMMUNITY): Payer: Self-pay | Admitting: Dentistry

## 2011-12-18 ENCOUNTER — Ambulatory Visit (HOSPITAL_COMMUNITY): Payer: Self-pay | Admitting: Dentistry

## 2011-12-18 VITALS — BP 138/70 | HR 54 | Temp 98.2°F

## 2011-12-18 DIAGNOSIS — K08109 Complete loss of teeth, unspecified cause, unspecified class: Secondary | ICD-10-CM

## 2011-12-18 DIAGNOSIS — Z463 Encounter for fitting and adjustment of dental prosthetic device: Secondary | ICD-10-CM

## 2011-12-18 NOTE — Progress Notes (Signed)
BP 138/70  Pulse 54  Temp(Src) 98.2 F (36.8 C) (Oral)  Warren [name] presents for continued upper and lower denture fabrication.  Procedure:  Upper and lower denture wax tryin. Good aesthetics of maxillary set up. Occlusion is open in the posterior molar area bilaterally. Will need to obtain new bite registration today and then reset lower molars. This was accomplished without complications. Patient did except the aesthetics of the maxillary set up today. Return to clinic for upper and lower denture wax try in 2.   Charlynne Pander 12/18/2011

## 2012-01-01 ENCOUNTER — Ambulatory Visit (HOSPITAL_COMMUNITY): Payer: Self-pay | Admitting: Dentistry

## 2012-01-01 VITALS — BP 109/65 | HR 53 | Temp 97.4°F

## 2012-01-01 DIAGNOSIS — K08109 Complete loss of teeth, unspecified cause, unspecified class: Secondary | ICD-10-CM

## 2012-01-01 DIAGNOSIS — Z463 Encounter for fitting and adjustment of dental prosthetic device: Secondary | ICD-10-CM

## 2012-01-01 NOTE — Progress Notes (Signed)
01/01/2012  BP 109/65  Pulse 53  Temp(Src) 97.4 F (36.3 C) (Oral)  [name] presents for continued upper and lower denture fabrication. Procedure: Upper and lower denture wax tryin. Aesthetics are acceptable as before. Posterior occlusion now noted to have maximum intercuspation. Patient accepts esthetics, phonetics, fit and function. Patient agrees to process "as is" in [place]. Patient to RTC for  upper and lower denture insertion.  Charlynne Pander 01/01/2012

## 2012-01-09 ENCOUNTER — Ambulatory Visit (HOSPITAL_COMMUNITY): Payer: Self-pay | Admitting: Dentistry

## 2012-01-09 VITALS — BP 145/75 | HR 53 | Temp 97.5°F

## 2012-01-09 DIAGNOSIS — Z463 Encounter for fitting and adjustment of dental prosthetic device: Secondary | ICD-10-CM

## 2012-01-09 DIAGNOSIS — K08109 Complete loss of teeth, unspecified cause, unspecified class: Secondary | ICD-10-CM

## 2012-01-09 NOTE — Progress Notes (Signed)
Thursday, Jan 09, 2012   BP: 145/75                   P: 53             T:97.5  Warren [name] presents for insertion of upper and lower complete dentures. Procedure: Pressure indicating paste was applied to dentures. Adjustments made as needed. [place]. Occlusion evaluated and adjustments made as needed for centric relation and protrusive strokes. Thick PIP applied to borders. Adjustments made as needed. [place]. Good esthetics, phonetics, fit, and function noted. Patient accepts results. Postop instructions were provided in a written and verbal format on the use and care of dentures. Gave patient denture brush and cup. Patient to keep dentures out if sore spots develop. Use salt water rinses as needed to aid healing. RTC as scheduled for denture adjustment. Call if problems arise before then. Patient dismissed in stable condition. Dr. Cindra Eves

## 2012-01-13 ENCOUNTER — Encounter (HOSPITAL_COMMUNITY): Payer: Self-pay | Admitting: Dentistry

## 2012-08-06 IMAGING — CR DG CHEST 1V PORT
1 series · 1 of 1 positions shown · non-contrast
Comparison: 08/21/2011

CLINICAL DATA: Shortness of breath.

PORTABLE CHEST - 1 VIEW

[view not recorded]
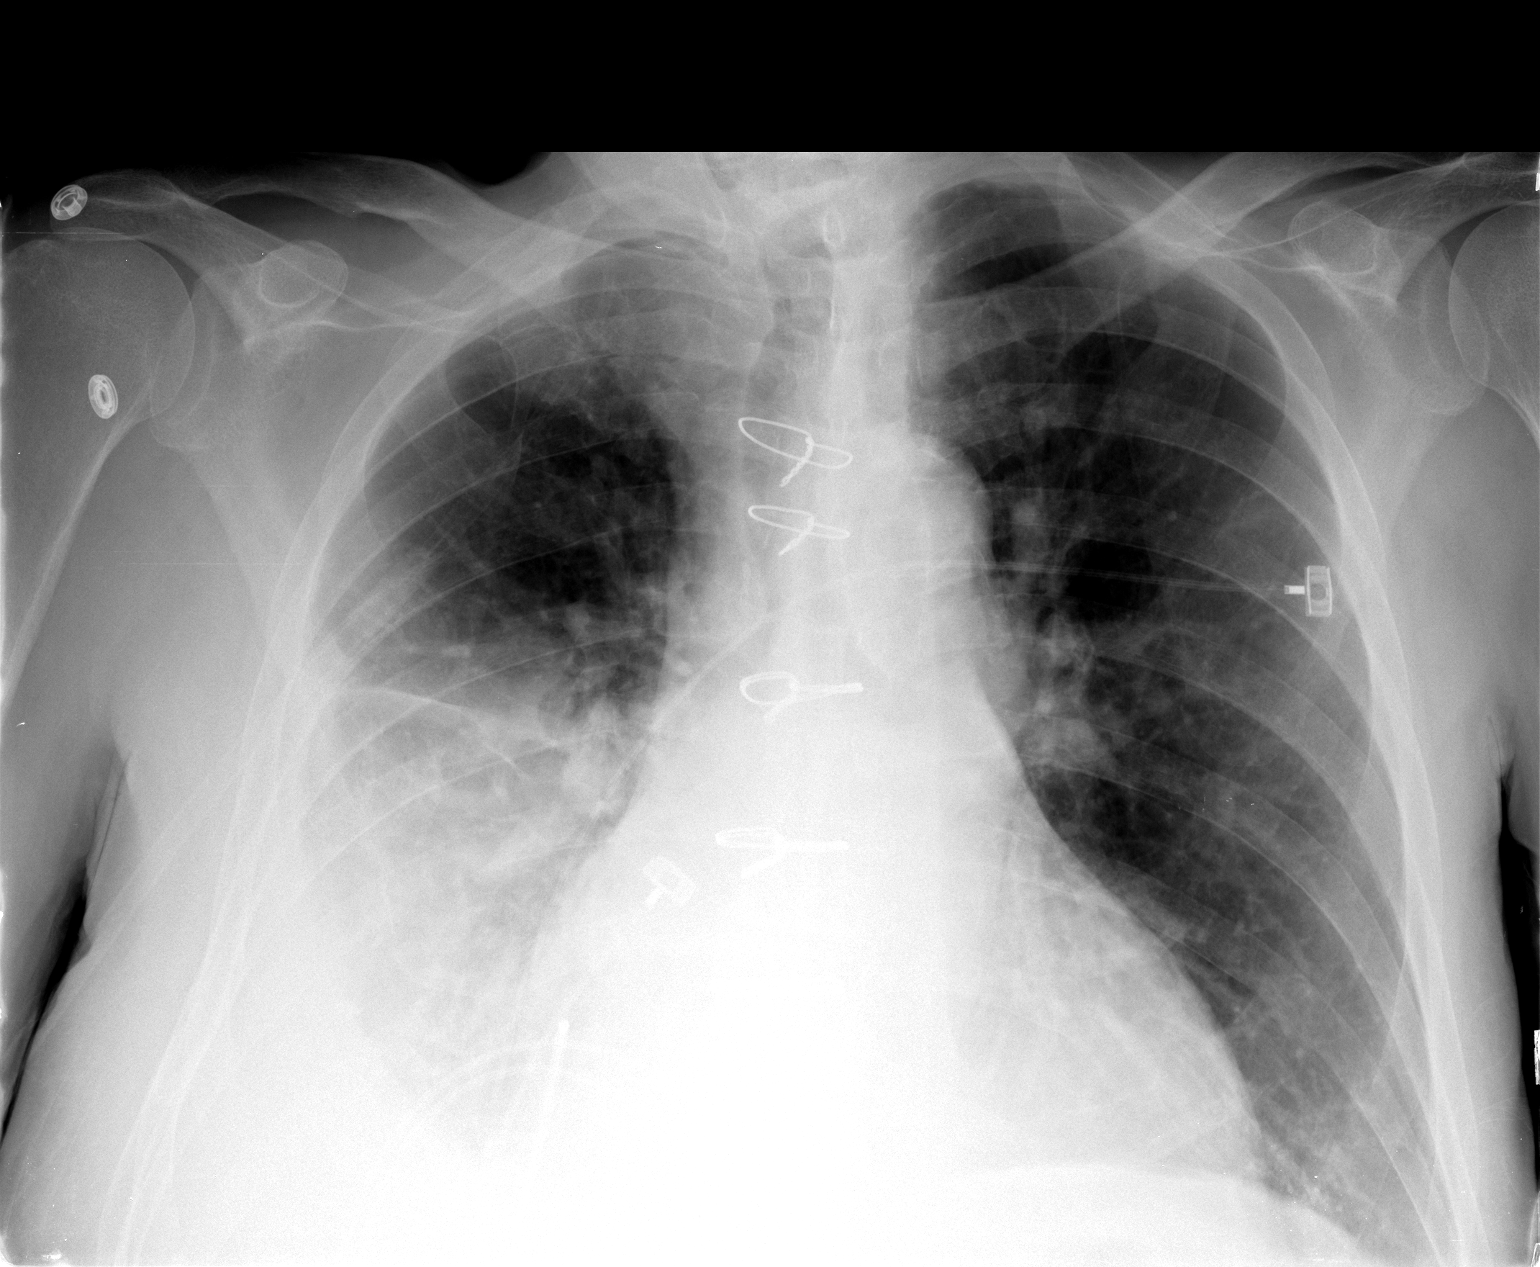

[1 of 1 positions shown; findings below may reference images not displayed]

FINDINGS: The right effusion has reaccumulated since the prior
study.  The heart size and vascularity remain normal.  Left lung is
clear.
IMPRESSION: Interval increase in the moderate right pleural effusion.

## 2012-08-21 IMAGING — CR DG CHEST 1V PORT
2 series · 2 of 2 positions shown · non-contrast
Comparison: 09/09/2011

CLINICAL DATA: Shortness of breath

PORTABLE CHEST - 1 VIEW

[AP (1 of 2)]
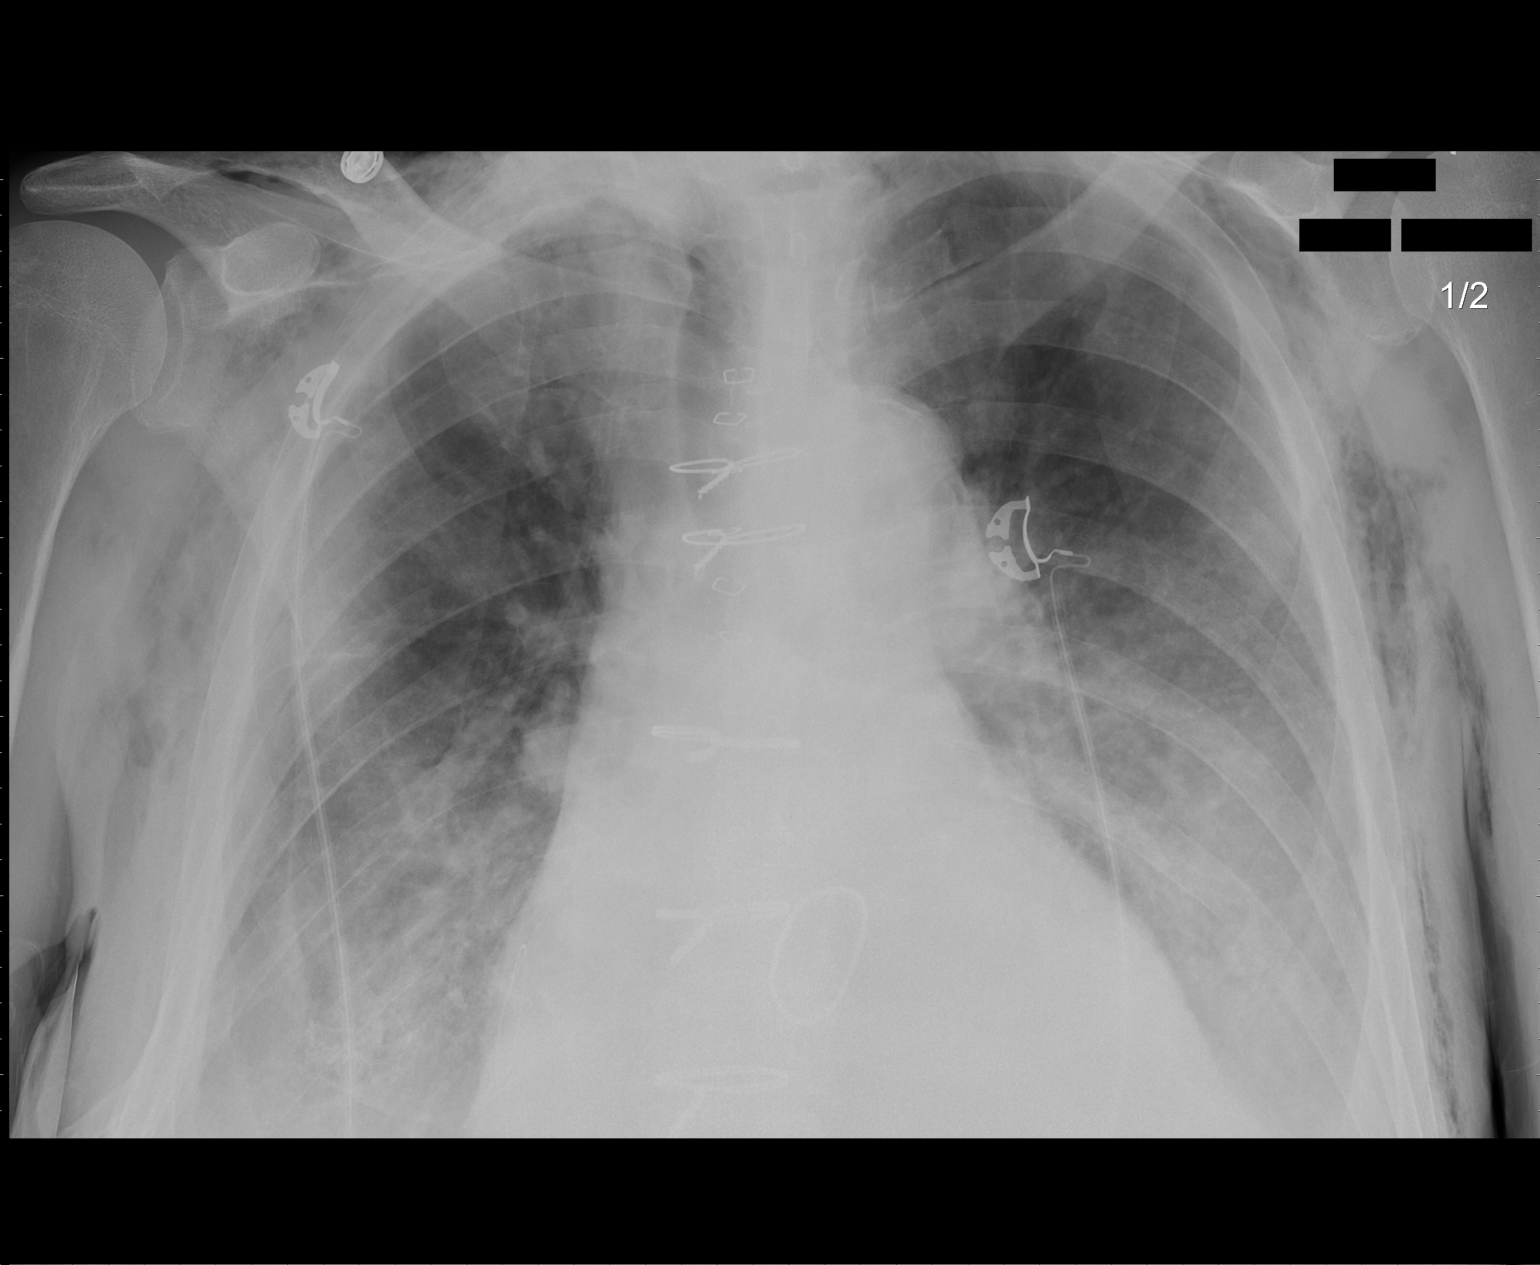

[AP (2 of 2)]
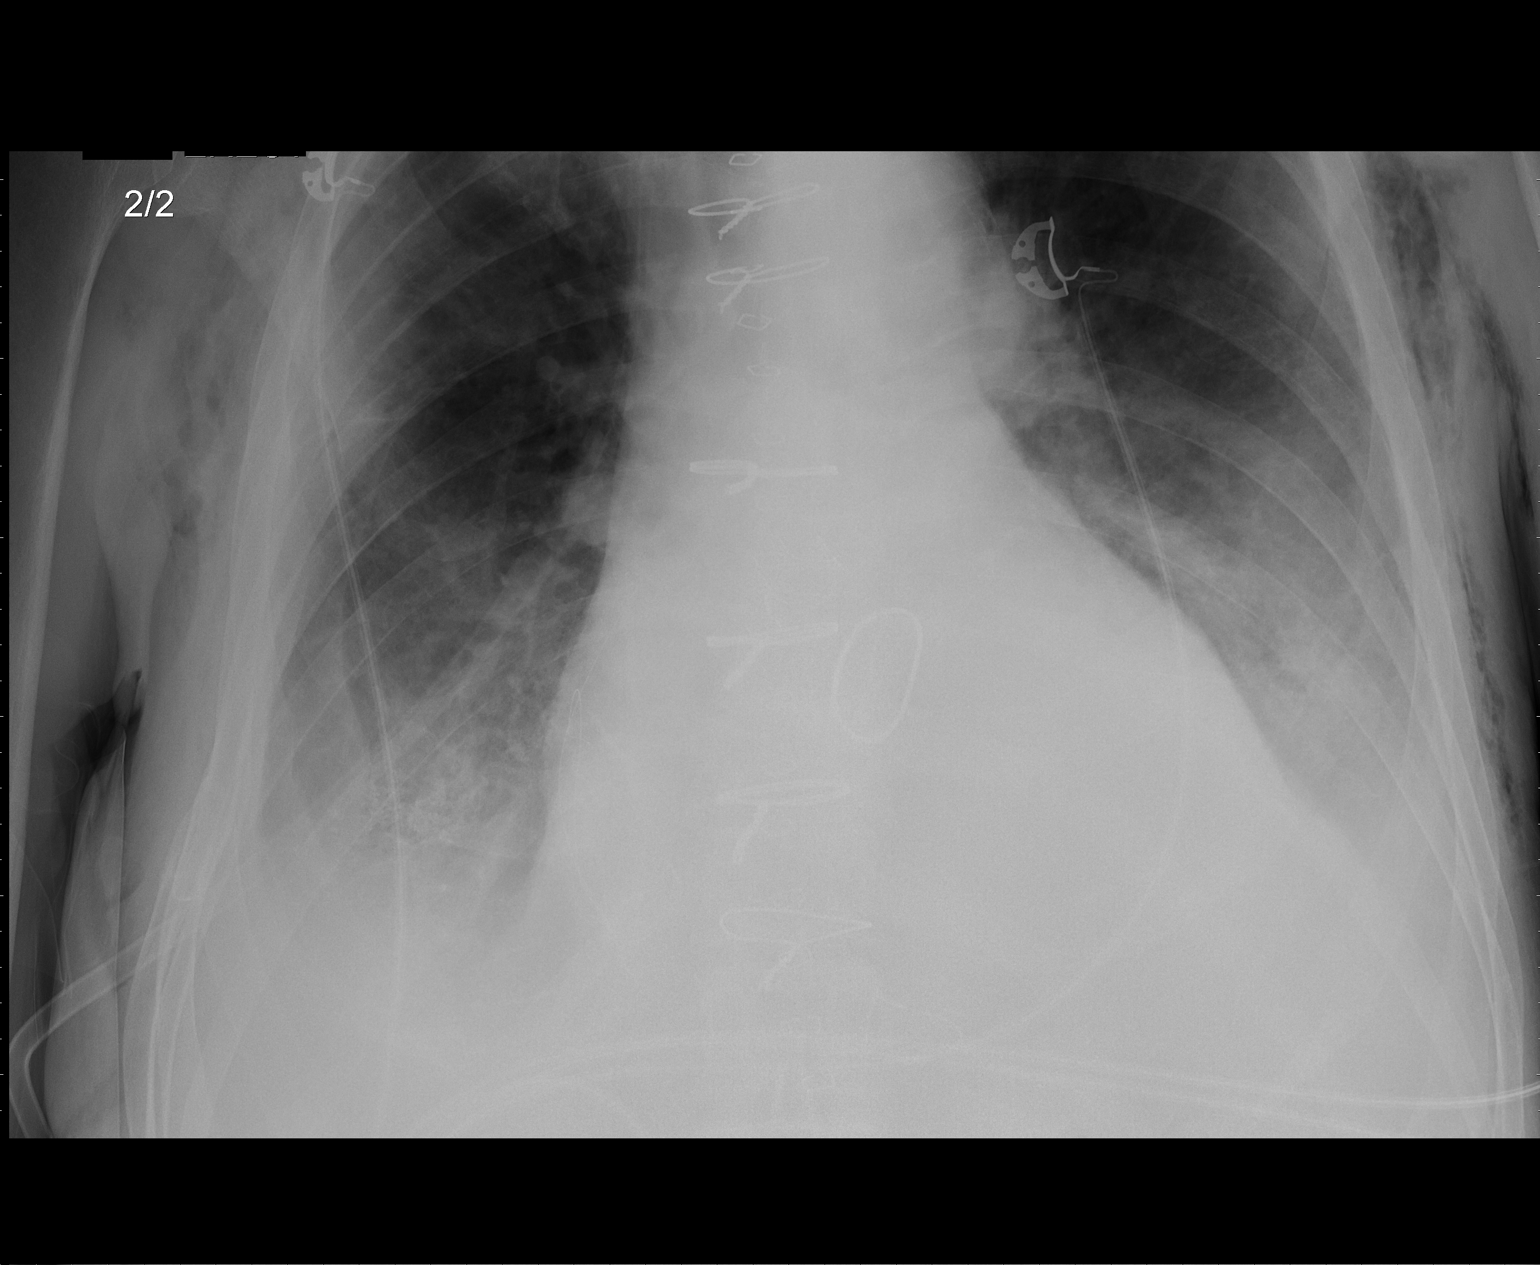

[2 of 2 positions shown; findings below may reference images not displayed]

FINDINGS: Postoperative changes in the mediastinum are stable since
previous study.  Cardiac enlargement.  Small bilateral pleural
effusions with basilar atelectasis, similar to the previous study.
There is increasing perihilar infiltration since the previous study
suggesting developing edema.  No pneumothorax.  Bilateral
subcutaneous emphysema is stable.  Calcification of the aorta.
IMPRESSION: Cardiac enlargement with bilateral pleural effusions basilar
atelectasis.  Increasing perihilar infiltration suggest developing
edema.  Stable subcutaneous emphysema.

## 2012-11-13 ENCOUNTER — Encounter (HOSPITAL_COMMUNITY): Payer: Self-pay | Admitting: Cardiology

## 2012-11-13 ENCOUNTER — Inpatient Hospital Stay (HOSPITAL_COMMUNITY)
Admission: AD | Admit: 2012-11-13 | Discharge: 2012-11-16 | DRG: 683 | Disposition: A | Discharge status: Home / Self Care | Payer: Medicare Other | Source: Other Acute Inpatient Hospital | Attending: Cardiology | Admitting: Cardiology

## 2012-11-13 DIAGNOSIS — R079 Chest pain, unspecified: Secondary | ICD-10-CM

## 2012-11-13 DIAGNOSIS — I129 Hypertensive chronic kidney disease with stage 1 through stage 4 chronic kidney disease, or unspecified chronic kidney disease: Principal | ICD-10-CM | POA: Diagnosis present

## 2012-11-13 DIAGNOSIS — Z7982 Long term (current) use of aspirin: Secondary | ICD-10-CM

## 2012-11-13 DIAGNOSIS — Z91199 Patient's noncompliance with other medical treatment and regimen due to unspecified reason: Secondary | ICD-10-CM

## 2012-11-13 DIAGNOSIS — Z8249 Family history of ischemic heart disease and other diseases of the circulatory system: Secondary | ICD-10-CM

## 2012-11-13 DIAGNOSIS — N401 Enlarged prostate with lower urinary tract symptoms: Secondary | ICD-10-CM | POA: Diagnosis present

## 2012-11-13 DIAGNOSIS — Z9119 Patient's noncompliance with other medical treatment and regimen: Secondary | ICD-10-CM

## 2012-11-13 DIAGNOSIS — Z952 Presence of prosthetic heart valve: Secondary | ICD-10-CM

## 2012-11-13 DIAGNOSIS — I509 Heart failure, unspecified: Secondary | ICD-10-CM | POA: Diagnosis present

## 2012-11-13 DIAGNOSIS — I4891 Unspecified atrial fibrillation: Secondary | ICD-10-CM | POA: Diagnosis present

## 2012-11-13 DIAGNOSIS — N189 Chronic kidney disease, unspecified: Secondary | ICD-10-CM | POA: Diagnosis present

## 2012-11-13 DIAGNOSIS — Z91018 Allergy to other foods: Secondary | ICD-10-CM

## 2012-11-13 DIAGNOSIS — Z79899 Other long term (current) drug therapy: Secondary | ICD-10-CM

## 2012-11-13 DIAGNOSIS — Z87891 Personal history of nicotine dependence: Secondary | ICD-10-CM

## 2012-11-13 DIAGNOSIS — I428 Other cardiomyopathies: Secondary | ICD-10-CM | POA: Diagnosis present

## 2012-11-13 DIAGNOSIS — F1010 Alcohol abuse, uncomplicated: Secondary | ICD-10-CM | POA: Diagnosis present

## 2012-11-13 DIAGNOSIS — F4310 Post-traumatic stress disorder, unspecified: Secondary | ICD-10-CM | POA: Diagnosis present

## 2012-11-13 DIAGNOSIS — I5022 Chronic systolic (congestive) heart failure: Secondary | ICD-10-CM | POA: Diagnosis present

## 2012-11-13 DIAGNOSIS — R338 Other retention of urine: Secondary | ICD-10-CM | POA: Diagnosis present

## 2012-11-13 HISTORY — DX: Dissection of unspecified site of aorta: I71.00

## 2012-11-13 HISTORY — DX: Nonrheumatic mitral (valve) insufficiency: I34.0

## 2012-11-13 HISTORY — DX: Nonrheumatic aortic (valve) insufficiency: I35.1

## 2012-11-13 HISTORY — DX: Chronic kidney disease, unspecified: N18.9

## 2012-11-13 MED ORDER — SODIUM CHLORIDE 0.9 % IV SOLN
250.0000 mL | INTRAVENOUS | Status: DC | PRN
  Administered 2012-11-13: 250 mL via INTRAVENOUS

## 2012-11-13 MED ORDER — AMLODIPINE BESYLATE 5 MG PO TABS
5.0000 mg | ORAL_TABLET | Freq: Every day | ORAL | Status: DC
  Administered 2012-11-13: 5 mg via ORAL
  Filled 2012-11-13 (×2): qty 1

## 2012-11-13 MED ORDER — ATORVASTATIN CALCIUM 40 MG PO TABS
40.0000 mg | ORAL_TABLET | Freq: Every day | ORAL | Status: DC
  Administered 2012-11-13 – 2012-11-15 (×3): 40 mg via ORAL
  Filled 2012-11-13 (×4): qty 1

## 2012-11-13 MED ORDER — SODIUM CHLORIDE 0.9 % IJ SOLN
3.0000 mL | Freq: Two times a day (BID) | INTRAMUSCULAR | Status: DC
  Administered 2012-11-13 – 2012-11-15 (×4): 3 mL via INTRAVENOUS

## 2012-11-13 MED ORDER — ASPIRIN EC 81 MG PO TBEC
81.0000 mg | DELAYED_RELEASE_TABLET | Freq: Every day | ORAL | Status: DC
  Administered 2012-11-14 – 2012-11-16 (×3): 81 mg via ORAL
  Filled 2012-11-13 (×3): qty 1

## 2012-11-13 MED ORDER — METOPROLOL TARTRATE 25 MG PO TABS
25.0000 mg | ORAL_TABLET | Freq: Two times a day (BID) | ORAL | Status: DC
  Administered 2012-11-13: 25 mg via ORAL
  Filled 2012-11-13 (×3): qty 1

## 2012-11-13 MED ORDER — NITROGLYCERIN IN D5W 200-5 MCG/ML-% IV SOLN
2.0000 ug/min | INTRAVENOUS | Status: DC
  Administered 2012-11-13: 20 ug/min via INTRAVENOUS

## 2012-11-13 MED ORDER — NITROGLYCERIN 0.4 MG SL SUBL
SUBLINGUAL_TABLET | SUBLINGUAL | Status: AC
  Administered 2012-11-13: 0.4 mg
  Filled 2012-11-13: qty 25

## 2012-11-13 MED ORDER — SODIUM CHLORIDE 0.9 % IJ SOLN: 3.0000 mL | INTRAMUSCULAR | Status: DC | PRN

## 2012-11-13 MED ORDER — ONDANSETRON HCL 4 MG/2ML IJ SOLN
4.0000 mg | Freq: Four times a day (QID) | INTRAMUSCULAR | Status: DC | PRN
  Administered 2012-11-13: 4 mg via INTRAVENOUS
  Filled 2012-11-13: qty 2

## 2012-11-13 MED ORDER — INFLUENZA VIRUS VACC SPLIT PF IM SUSP
0.5000 mL | INTRAMUSCULAR | Status: AC
  Administered 2012-11-14: 0.5 mL via INTRAMUSCULAR
  Filled 2012-11-13: qty 0.5

## 2012-11-13 MED ORDER — ACETAMINOPHEN 325 MG PO TABS
650.0000 mg | ORAL_TABLET | ORAL | Status: DC | PRN
  Administered 2012-11-14 – 2012-11-15 (×3): 650 mg via ORAL
  Filled 2012-11-13 (×3): qty 2

## 2012-11-13 MED ORDER — NITROGLYCERIN 0.4 MG SL SUBL
0.4000 mg | SUBLINGUAL_TABLET | SUBLINGUAL | Status: DC | PRN
  Administered 2012-11-15: 0.4 mg via SUBLINGUAL
  Filled 2012-11-13: qty 25
  Filled 2012-11-13: qty 75

## 2012-11-13 MED ORDER — TAMSULOSIN HCL 0.4 MG PO CAPS
0.4000 mg | ORAL_CAPSULE | Freq: Every day | ORAL | Status: DC
  Administered 2012-11-14 – 2012-11-16 (×3): 0.4 mg via ORAL
  Filled 2012-11-13 (×3): qty 1

## 2012-11-13 MED ORDER — MORPHINE SULFATE 2 MG/ML IJ SOLN
2.0000 mg | INTRAMUSCULAR | Status: DC | PRN
  Administered 2012-11-14 – 2012-11-15 (×3): 2 mg via INTRAVENOUS
  Filled 2012-11-13 (×4): qty 1

## 2012-11-13 MED ORDER — MORPHINE SULFATE 2 MG/ML IJ SOLN
INTRAMUSCULAR | Status: AC
  Administered 2012-11-13: 2 mg via INTRAVENOUS
  Filled 2012-11-13: qty 1

## 2012-11-13 MED ORDER — HEPARIN (PORCINE) IN NACL 100-0.45 UNIT/ML-% IJ SOLN
1400.0000 [IU]/h | INTRAMUSCULAR | Status: DC
  Administered 2012-11-13: 1400 [IU]/h via INTRAVENOUS
  Administered 2012-11-13: 1250 [IU]/h via INTRAVENOUS
  Administered 2012-11-14: 1400 [IU]/h via INTRAVENOUS
  Filled 2012-11-13 (×2): qty 250
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
## 2012-11-14 ENCOUNTER — Encounter (HOSPITAL_COMMUNITY): Payer: Self-pay

## 2012-11-14 DIAGNOSIS — R0602 Shortness of breath: Secondary | ICD-10-CM

## 2012-11-14 DIAGNOSIS — I369 Nonrheumatic tricuspid valve disorder, unspecified: Secondary | ICD-10-CM

## 2012-11-14 DIAGNOSIS — R079 Chest pain, unspecified: Secondary | ICD-10-CM

## 2012-11-14 LAB — LIPID PANEL
HDL: 51 mg/dL (ref >39)
LDL Cholesterol: 73 mg/dL (ref 0–99)
Total CHOL/HDL Ratio: 2.7 RATIO
Triglycerides: 76 mg/dL (ref <150)
VLDL: 15 mg/dL (ref 0–40)

## 2012-11-14 LAB — DRUGS OF ABUSE SCREEN W/O ALC, ROUTINE URINE
Amphetamine Screen, Ur: NEGATIVE
Creatinine,U: 205.5 mg/dL
Marijuana Metabolite: POSITIVE — AB
Propoxyphene: NEGATIVE

## 2012-11-14 LAB — BASIC METABOLIC PANEL
Calcium: 8.5 mg/dL (ref 8.4–10.5)
Chloride: 102 mEq/L (ref 96–112)
Creatinine, Ser: 1.53 mg/dL — ABNORMAL HIGH (ref 0.50–1.35)
GFR calc Af Amer: 53 mL/min — ABNORMAL LOW (ref >90)
GFR calc non Af Amer: 46 mL/min — ABNORMAL LOW (ref >90)

## 2012-11-14 LAB — CBC
MCH: 25.7 pg — ABNORMAL LOW (ref 26.0–34.0)
Platelets: 138 10*3/uL — ABNORMAL LOW (ref 150–400)
RBC: 4.63 MIL/uL (ref 4.22–5.81)
WBC: 5.1 10*3/uL (ref 4.0–10.5)

## 2012-11-14 LAB — COMPREHENSIVE METABOLIC PANEL
ALT: 13 U/L (ref 0–53)
AST: 17 U/L (ref 0–37)
Albumin: 3.2 g/dL — ABNORMAL LOW (ref 3.5–5.2)
CO2: 26 mEq/L (ref 19–32)
Calcium: 8.5 mg/dL (ref 8.4–10.5)
Chloride: 104 mEq/L (ref 96–112)
GFR calc non Af Amer: 44 mL/min — ABNORMAL LOW (ref >90)
Sodium: 138 mEq/L (ref 135–145)

## 2012-11-14 LAB — HEMOGLOBIN A1C: Hgb A1c MFr Bld: 5.3 % (ref <5.7)

## 2012-11-14 LAB — TSH: TSH: 2.271 u[IU]/mL (ref 0.350–4.500)

## 2012-11-14 MED ORDER — CARVEDILOL 12.5 MG PO TABS
12.5000 mg | ORAL_TABLET | Freq: Two times a day (BID) | ORAL | Status: DC
  Filled 2012-11-14 (×2): qty 1

## 2012-11-14 MED ORDER — AMLODIPINE BESYLATE 10 MG PO TABS
10.0000 mg | ORAL_TABLET | Freq: Every day | ORAL | Status: DC
  Administered 2012-11-14 – 2012-11-16 (×3): 10 mg via ORAL
  Filled 2012-11-14 (×3): qty 1

## 2012-11-14 MED ORDER — CARVEDILOL 12.5 MG PO TABS
12.5000 mg | ORAL_TABLET | Freq: Two times a day (BID) | ORAL | Status: DC
  Administered 2012-11-14 – 2012-11-16 (×5): 12.5 mg via ORAL
  Filled 2012-11-14 (×7): qty 1

## 2012-11-14 NOTE — Progress Notes (Signed)
*  PRELIMINARY RESULTS* Vascular Ultrasound Lower extremity venous duplex has been completed.  Preliminary findings: Bilateral:  No evidence of DVT, superficial thrombosis, or Baker's Cyst.   Farrel Demark, RDMS, RVT  11/14/2012, 11:30 AM

## 2012-11-14 NOTE — Progress Notes (Addendum)
Patient ID: Warren [name], male   DOB: 07-08-1946, 67 y.o.   MRN: [id number]    SUBJECTIVE: He is still on NTG gtt this morning.  BP better but still elevated.  Occasional mild on and off chest pain.  Cardiac enzymes negative, ECG unchanged.   Marland Kitchen amLODipine  10 mg Oral Daily  . aspirin EC  81 mg Oral Daily  . atorvastatin  40 mg Oral q1800  . carvedilol  12.5 mg Oral BID WC  . influenza  inactive virus vaccine  0.5 mL Intramuscular Tomorrow-1000  . sodium chloride  3 mL Intravenous Q12H  . tamsulosin  0.4 mg Oral Daily  NTG gtt @ 50 mcg/min    Filed Vitals:   11/14/12 0500 11/14/12 0600 11/14/12 0628 11/14/12 0700  BP: 153/80 162/91 132/79 142/71  Pulse: 64 62 62 65  Temp:      TempSrc:      Resp: 12 13 11 15   Height:      Weight: 195 lb 12.3 oz (88.8 kg)     SpO2: 99% 99% 99% 95%    Intake/Output Summary (Last 24 hours) at 11/14/12 0804 Last data filed at 11/14/12 0700  Gross per 24 hour  Intake 964.45 ml  Output    875 ml  Net  89.45 ml    LABS: Basic Metabolic Panel:  Recent Labs  78/29/56 0451  NA 138  K 3.6  CL 104  CO2 26  GLUCOSE 97  BUN 18  CREATININE 1.58*  CALCIUM 8.5   Liver Function Tests:  Recent Labs  11/14/12 0451  AST 17  ALT 13  ALKPHOS 75  BILITOT 1.0  PROT 5.7*  ALBUMIN 3.2*   No results found for this basename: LIPASE, AMYLASE,  in the last 72 hours CBC:  Recent Labs  11/14/12 0451  WBC 5.1  HGB 11.9*  HCT 36.2*  MCV 78.2  PLT 138*   Cardiac Enzymes:  Recent Labs  11/13/12 2044 11/14/12 0027 11/14/12 0452  TROPONINI <0.30 <0.30 <0.30   BNP: No components found with this basename: POCBNP,  D-Dimer: No results found for this basename: DDIMER,  in the last 72 hours Hemoglobin A1C:  Recent Labs  11/13/12 2043  HGBA1C 5.3   Fasting Lipid Panel:  Recent Labs  11/14/12 0452  CHOL 139  HDL 51  LDLCALC 73  TRIG 76  CHOLHDL 2.7   Thyroid Function Tests:  Recent Labs  11/13/12 2043  TSH 2.271    Anemia Panel: No results found for this basename: VITAMINB12, FOLATE, FERRITIN, TIBC, IRON, RETICCTPCT,  in the last 72 hours  RADIOLOGY: No results found.  PHYSICAL EXAM General: NAD Neck: No JVD, no thyromegaly or thyroid nodule.  Lungs: Clear to auscultation bilaterally with normal respiratory effort. CV: Nondisplaced PMI.  Heart regular S1/S2, no S3/S4, 2/6 SEM.  No peripheral edema.  No carotid bruit.  Normal pedal pulses.  Abdomen: Soft, nontender, no hepatosplenomegaly, no distention.  Neurologic: Alert and oriented x 3.  Psych: Normal affect. Extremities: No clubbing or cyanosis.   TELEMETRY: Reviewed telemetry pt in NSR  ASSESSMENT AND PLAN: 67 yo with history of type I aortic dissection/aortic valve re-suspension and later bioprosthetic aortic valve replacement/MV repair presented with chest pain.   1. Chest pain: Much improved, only slight occasional pain this morning.  May have been due to hypertensive urgency given BP 230/112 at admission.  Cardiac enzymes negative, ECG with nonspecific T wave changes.  Cath in 2012 with no coronary disease.   -  Can stop heparin gtt, continue ASA 81 daily.  - Mediastinum not widened.  Need to get echo to assess aortic valve and root.   - Venous dopplers to assess for DVT (elevated D dimer) but not doing CTA chest given elevated creatinine.  2. BP: Hypertensive urgency may be cause of his symptoms.  BP still up on NTG gtt.  Will stop metoprolol and use Coreg 12.5 mg bid.  Increase amlodipine to 10 mg daily.  Try to titrate off NTG gtt today.  May need to find a better way for [facility] to [facility] than [facility] (has been out for over a month).  3. Cardiomyopathy: EF 35-40% on last echo.  Does not appear volume overloaded.  Getting echo today. Starting Coreg.  Will add ACEI in am if creatinine stable.   [name] 11/14/2012 8:11 AM

## 2012-11-14 NOTE — Progress Notes (Signed)
Called into patient's room.  Pt complaining of 6/10 midsternal chest pain.  STAT EKG performed with no changes from previous EKG done on 4/4.  2 mg morphine given.  Pt started to feel nauseous, but did not vomit.  Dr. Antoine Poche notified.  No new orders given at this time.  Will continue to monitor closely. Dara Hoyer

## 2012-11-14 NOTE — Care Management Note (Unsigned)
    Page 1 of 1   11/14/2012     12:29:27 PM   CARE MANAGEMENT NOTE 11/14/2012  Patient:  Warren [name], Warren [name]   Account Number:  [id number]  Date Initiated:  11/14/2012  Documentation initiated by:  GRAVES-BIGELOW,Lissette Schenk  Subjective/Objective Assessment:   Pt admitted with cp. Pt is from home alone and PCP is at the [facility] in [place]. Pt lives in [place] and has issues with transportation. He is unable to drive at this time due to DUI.     Action/Plan:   CM did provide pt wih a list of transportation services in [place] and he stated he uses [facility] when he can not get to clinic for medicaitons. Cm unable to help at this time with meds due to pt has insurance.   Anticipated DC Date:  11/17/2012   Anticipated DC Plan:  HOME/SELF CARE      DC Planning Services  CM consult      Choice offered to / List presented to:             Status of service:  In process, will continue to follow Medicare Important Message given?   (If response is "NO", the following Medicare IM given date fields will be blank) Date Medicare IM given:   Date Additional Medicare IM given:    Discharge Disposition:    Per UR Regulation:  Reviewed for med. necessity/level of care/duration of stay  If discussed at Long Length of Stay Meetings, dates discussed:    Comments:  11-14-12 1228 Tomi Bamberger, RN, BSN [phone] CM will continue to monitor for d/ needs.

## 2012-11-14 NOTE — Progress Notes (Signed)
  Echocardiogram 2D Echocardiogram has been performed.  Warren [name] 11/14/2012, 10:36 AM
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
## 2012-11-14 NOTE — Progress Notes (Signed)
Utilization Review Completed.   Gordan Grell, RN, BSN Nurse Case Manager  [phone]  

## 2012-11-15 ENCOUNTER — Encounter (HOSPITAL_COMMUNITY): Payer: Self-pay | Admitting: Physician Assistant

## 2012-11-15 ENCOUNTER — Inpatient Hospital Stay (HOSPITAL_COMMUNITY): Payer: Medicare Other

## 2012-11-15 LAB — CBC
MCH: 25.8 pg — ABNORMAL LOW (ref 26.0–34.0)
MCHC: 32.8 g/dL (ref 30.0–36.0)
Platelets: 123 10*3/uL — ABNORMAL LOW (ref 150–400)
RDW: 13.7 % (ref 11.5–15.5)

## 2012-11-15 LAB — HEPARIN LEVEL (UNFRACTIONATED): Heparin Unfractionated: <0.1 IU/mL — ABNORMAL LOW (ref 0.30–0.70)

## 2012-11-15 MED ORDER — TECHNETIUM TC 99M SESTAMIBI GENERIC - CARDIOLITE
30.0000 | Freq: Once | INTRAVENOUS | Status: AC | PRN
  Administered 2012-11-15: 30 via INTRAVENOUS

## 2012-11-15 MED ORDER — REGADENOSON 0.4 MG/5ML IV SOLN
0.4000 mg | Freq: Once | INTRAVENOUS | Status: AC
  Administered 2012-11-15: 0.4 mg via INTRAVENOUS

## 2012-11-15 MED ORDER — TECHNETIUM TC 99M SESTAMIBI GENERIC - CARDIOLITE
10.0000 | Freq: Once | INTRAVENOUS | Status: AC | PRN
  Administered 2012-11-15: 10 via INTRAVENOUS

## 2012-11-15 MED ORDER — HYDRALAZINE HCL 25 MG PO TABS
25.0000 mg | ORAL_TABLET | Freq: Three times a day (TID) | ORAL | Status: DC
  Administered 2012-11-15 (×3): 25 mg via ORAL
  Filled 2012-11-15 (×6): qty 1

## 2012-11-15 NOTE — Progress Notes (Signed)
Lexiscan Myoview  Lexiscan Injected. ECG:  No significant change from baseline. Symptoms:  Chest pain, dyspnea.  Resolved at end of test. Images Pending.  SignedTereso Newcomer, PA-C  10:01 AM 11/15/2012
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
## 2012-11-15 NOTE — Progress Notes (Signed)
Patient ID: Warren [name], male   DOB: 01/19/1946, 67 y.o.   MRN: [id number]     SUBJECTIVE: BP improving but still high.  No chest pain during day yesterday but low level chest pain recurred last night and is still present this morning.  He is worried about it.  It was not relieved by NTG but morphine helped.    Marland Kitchen amLODipine  10 mg Oral Daily  . aspirin EC  81 mg Oral Daily  . atorvastatin  40 mg Oral q1800  . carvedilol  12.5 mg Oral BID WC  . hydrALAZINE  25 mg Oral TID  . sodium chloride  3 mL Intravenous Q12H  . tamsulosin  0.4 mg Oral Daily    Filed Vitals:   11/15/12 0500 11/15/12 0600 11/15/12 0700 11/15/12 0730  BP: 158/74 179/87 176/73 173/89  Pulse: 58 60 57   Temp:      TempSrc:      Resp: 14 18 13    Height:      Weight:      SpO2: 99% 100% 100%     Intake/Output Summary (Last 24 hours) at 11/15/12 0819 Last data filed at 11/15/12 0357  Gross per 24 hour  Intake   1074 ml  Output   2050 ml  Net   -976 ml    LABS: Basic Metabolic Panel:  Recent Labs  40/98/11 0451 11/14/12 0814  NA 138 132*  K 3.6 3.6  CL 104 102  CO2 26 24  GLUCOSE 97 109*  BUN 18 18  CREATININE 1.58* 1.53*  CALCIUM 8.5 8.5   Liver Function Tests:  Recent Labs  11/14/12 0451  AST 17  ALT 13  ALKPHOS 75  BILITOT 1.0  PROT 5.7*  ALBUMIN 3.2*   No results found for this basename: LIPASE, AMYLASE,  in the last 72 hours CBC:  Recent Labs  11/14/12 0451 11/15/12 0431  WBC 5.1 4.4  HGB 11.9* 12.2*  HCT 36.2* 37.2*  MCV 78.2 78.8  PLT 138* 123*   Cardiac Enzymes:  Recent Labs  11/13/12 2044 11/14/12 0027 11/14/12 0452  TROPONINI <0.30 <0.30 <0.30   BNP: No components found with this basename: POCBNP,  D-Dimer: No results found for this basename: DDIMER,  in the last 72 hours Hemoglobin A1C:  Recent Labs  11/13/12 2043  HGBA1C 5.3   Fasting Lipid Panel:  Recent Labs  11/14/12 0452  CHOL 139  HDL 51  LDLCALC 73  TRIG 76  CHOLHDL 2.7    Thyroid Function Tests:  Recent Labs  11/13/12 2043  TSH 2.271   Anemia Panel: No results found for this basename: VITAMINB12, FOLATE, FERRITIN, TIBC, IRON, RETICCTPCT,  in the last 72 hours  RADIOLOGY: No results found.  PHYSICAL EXAM General: NAD Neck: No JVD, no thyromegaly or thyroid nodule.  Lungs: Clear to auscultation bilaterally with normal respiratory effort. CV: Nondisplaced PMI.  Heart regular S1/S2, no S3/S4, 2/6 SEM.  No peripheral edema.  No carotid bruit.  Normal pedal pulses.  Abdomen: Soft, nontender, no hepatosplenomegaly, no distention.  Neurologic: Alert and oriented x 3.  Psych: Normal affect. Extremities: No clubbing or cyanosis.   TELEMETRY: Reviewed telemetry pt in NSR  ASSESSMENT AND PLAN: 67 yo with history of type I aortic dissection/aortic valve re-suspension and later bioprosthetic aortic valve replacement/MV repair presented with chest pain.   1. Chest pain: Not present yesterday but back again this morning, low level.  Initially may have been due to hypertensive urgency given  BP 230/112 at admission but BP overnight was only mildly elevated.  Cardiac enzymes negative, ECG with nonspecific T wave changes.  Cath in 2012 with no coronary disease.  Echo yesterday showed stable bioprosthetic aortic valve and mitral valve repair, normal EF.  Ascending aorta was not well-visualized but no definite abnormality seen.  Venous dopplers negative for DVT (CTA chest not done with elevated creatinine).  - Given on and off ongoing pain, will get Lexiscan myoview.  Will see if we can get it today, otherwise will have to be Monday. 2. BP: Hypertensive urgency may be cause of his symptoms.  BP is still elevated.  HR in 50s so hold off on Coreg titration.  Baseline creatinine 1.5-1.9 so for now will hold off on ACEI initiation.  Will start hydralazine for better control. 3. Cardiomyopathy: EF improved to 55-60% on echo.  He is not volume overloaded on exam.   [name] 11/15/2012 8:19 AM

## 2012-11-15 NOTE — Progress Notes (Signed)
Spoke with RN.  Myoview normal.  BP 148/95.  No further CP. Patient lives in [place] and cannot get a ride until tomorrow. Will observe overnight and potentially send home in AM.

## 2012-11-16 MED ORDER — ASPIRIN 81 MG PO TBEC: 81.0000 mg | DELAYED_RELEASE_TABLET | Freq: Every day | ORAL | Status: AC

## 2012-11-16 MED ORDER — CARVEDILOL 12.5 MG PO TABS: 12.5000 mg | ORAL_TABLET | Freq: Two times a day (BID) | ORAL | Status: DC

## 2012-11-16 MED ORDER — TAMSULOSIN HCL 0.4 MG PO CAPS: 0.4000 mg | ORAL_CAPSULE | Freq: Every day | ORAL | Status: AC

## 2012-11-16 MED ORDER — HYDRALAZINE HCL 50 MG PO TABS: 50.0000 mg | ORAL_TABLET | Freq: Three times a day (TID) | ORAL | Status: AC

## 2012-11-16 MED ORDER — HYDRALAZINE HCL 50 MG PO TABS
50.0000 mg | ORAL_TABLET | Freq: Three times a day (TID) | ORAL | Status: DC
  Administered 2012-11-16: 50 mg via ORAL
  Filled 2012-11-16 (×4): qty 1

## 2012-11-16 MED ORDER — AMLODIPINE BESYLATE 10 MG PO TABS: 10.0000 mg | ORAL_TABLET | Freq: Every day | ORAL | Status: AC

## 2012-11-16 NOTE — Progress Notes (Signed)
Patient: Warren [name] / Admit Date: 11/13/2012 / Date of Encounter: 11/16/2012, 7:00 AM   Subjective  No CP or SOB. "I feel good."   Objective   Telemetry: NSR Physical Exam: Filed Vitals:   11/16/12 0100  BP: 161/68  Pulse: 59  Temp: 98  Resp: 16   General: Well developed, well nourished WM in no acute distress. Head: Normocephalic, atraumatic, sclera non-icteric, no xanthomas, nares are without discharge. Neck: JVD not elevated. Lungs: Clear bilaterally to auscultation without wheezes, rales, or rhonchi. Breathing is unlabored. Heart: RRR S1 S2 2/6 SEM without rubs or gallops.  Abdomen: Soft, non-tender, non-distended with normoactive bowel sounds. No hepatomegaly. No rebound/guarding. No obvious abdominal masses. Msk:  Strength and tone appear normal for age. Extremities: No clubbing or cyanosis. No edema.  Distal pedal pulses are 2+ and equal bilaterally. Neuro: Alert and oriented X 3. Moves all extremities spontaneously. Psych:  Responds to questions appropriately with a normal affect.    Intake/Output Summary (Last 24 hours) at 11/16/12 0700 Last data filed at 11/15/12 2300  Gross per 24 hour  Intake    960 ml  Output   1860 ml  Net   -900 ml    Inpatient Medications:  . amLODipine  10 mg Oral Daily  . aspirin EC  81 mg Oral Daily  . atorvastatin  40 mg Oral q1800  . carvedilol  12.5 mg Oral BID WC  . hydrALAZINE  25 mg Oral TID  . sodium chloride  3 mL Intravenous Q12H  . tamsulosin  0.4 mg Oral Daily    Labs:  Recent Labs  11/14/12 0451 11/14/12 0814  NA 138 132*  K 3.6 3.6  CL 104 102  CO2 26 24  GLUCOSE 97 109*  BUN 18 18  CREATININE 1.58* 1.53*  CALCIUM 8.5 8.5    Recent Labs  11/14/12 0451  AST 17  ALT 13  ALKPHOS 75  BILITOT 1.0  PROT 5.7*  ALBUMIN 3.2*    Recent Labs  11/14/12 0451 11/15/12 0431  WBC 5.1 4.4  HGB 11.9* 12.2*  HCT 36.2* 37.2*  MCV 78.2 78.8  PLT 138* 123*    Recent Labs  11/13/12 2044 11/14/12 0027  11/14/12 0452  TROPONINI <0.30 <0.30 <0.30    Recent Labs  11/13/12 2043  HGBA1C 5.3    Recent Labs  11/14/12 0452  CHOL 139  HDL 51  LDLCALC 73  TRIG 76  CHOLHDL 2.7    Radiology/Studies:  Nm Myocar Multi W/spect W/wall Motion / Ef  11/15/2012  *RADIOLOGY REPORT*  Clinical data: Chest pain.  Previous median sternotomy and valve surgery.  NUCLEAR MEDICINE MYOCARDIAL PERFUSION IMAGING NUCLEAR MEDICINE LEFT VENTRICULAR WALL MOTION ANALYSIS NUCLEAR MEDICINE LEFT VENTRICULAR EJECTION FRACTION CALCULATION  Technique: Standard single day myocardial SPECT imaging was performed after resting intravenous injection of Tc-44m sestamibi. After intravenous infusion of Lexiscan (regadenoson) under supervision of cardiology staff, sestamibiwas injected intravenously and standard myocardial SPECT imaging was performed. Quantitative gated imaging was also performed to evaluate left ventricular wall motion and estimate left ventricular ejection fraction.  Radiopharmaceutical: 10+30 mCi Tc61m sestamibiIV.  Comparison: None available  Findings:    The stress SPECT images demonstrate physiologic distribution of radiopharmaceutical. Rest images demonstrate no perfusion defects. The gated stress SPECT images demonstrate normal left ventricular myocardial thickening. There is mild septal hypokinesis.  Calculated left ventricular end-diastolic volume , end-systolic volume 56ml, ejection fraction of 54%.  IMPRESSION:  1. Negative for pharmacologic-stress induced ischemia.  2. Left  ventricular ejection fraction 54%. 3.  Mild septal hypokinesis with normal myocardial thickening.   Original Report Authenticated By: D. Andria Rhein, MD      Assessment and Plan  66 yo with history of type I aortic dissection/aortic valve re-suspension and later bioprosthetic aortic valve replacement/MV repair presented with chest pain.   1. Chest pain: Initially may have been due to hypertensive urgency given BP 230/112 at  admission. Cardiac enzymes negative, ECG with nonspecific T wave changes, cath in 2012 with no coronary disease & normal Myoview this admission. Echo yesterday showed stable bioprosthetic aortic valve and mitral valve repair, normal EF. Ascending aorta was not well-visualized but no definite abnormality seen. Venous dopplers negative for DVT (CTA chest not done with elevated creatinine). He is not tachycardic, tachypnic, or hypoxic. Plans for discharge per MD but likely OK to go home today.  2. BP: Hypertensive urgency may be cause of his symptoms. BP is still elevated. HR in 50s so hold off on Coreg titration. Baseline creatinine 1.5-1.9 so for now will hold off on ACEI initiation. Consider increasing hydralazine to 50mg  TID. 3. H/o Cardiomyopathy: EF improved to 55-60% on echo. He is not volume overloaded on exam.  4. Renal insufficiency, likely chronic: baseline Cr appears 1.5-1.9. 5. Noncompliance - appreciate CM consult - patient has insurance thus ability to pay for medications. They provided him with a list of transportation services since he cannot drive at this time due to DUI. Compliance with meds encouraged.  He would like to f/u in our [place] office.  Signed, [name] PA-C Patient is doing well today. No further chest pain.  Myoview yesterday negative for ischemia. Systolic pressure still high so will increase hydralazine to 50 mg TID. Exam today unremarkable. Okay for discharge today. Care management to help to be sure he is able to get his medicines from the [place].    Follow up in [place].

## 2012-11-17 ENCOUNTER — Telehealth: Payer: Self-pay | Admitting: *Deleted

## 2012-11-17 LAB — OPIATE, QUANTITATIVE, URINE
6 Monoacetylmorphine, Ur-Confirm: NEGATIVE ng/mL
Hydrocodone: NEGATIVE ng/mL
Morphine, Confirm: 3428 ng/mL — ABNORMAL HIGH
Norhydrocodone, Ur: NEGATIVE ng/mL
Noroxycodone, Ur: NEGATIVE ng/mL
Oxycodone, ur: NEGATIVE ng/mL
Oxymorphone: NEGATIVE ng/mL

## 2012-11-17 NOTE — Telephone Encounter (Signed)
S/w pt is doing well, changed meds in hospital, took away amiodarone and metoprolol added hydralazine and coreg. Got out of the hospital yesterday and no one has called pt for a f/u appt. Told pt someone would call him back to make appt in [place]. Pt has been monitoring bp running about 135/85 overall pt feeling well

## 2012-11-17 NOTE — H&P (Signed)
Please see note 11/13/12 that was inadvertently listed as a Discharge Summary. This is meant to serve as his H&P. Dayna Dunn PA-C

## 2012-12-02 ENCOUNTER — Ambulatory Visit (INDEPENDENT_AMBULATORY_CARE_PROVIDER_SITE_OTHER): Payer: Medicare Other | Admitting: Physician Assistant

## 2012-12-02 ENCOUNTER — Encounter: Payer: Self-pay | Admitting: Physician Assistant

## 2012-12-02 VITALS — BP 129/73 | HR 81 | Ht 72.0 in | Wt 196.0 lb

## 2012-12-02 DIAGNOSIS — R079 Chest pain, unspecified: Secondary | ICD-10-CM

## 2012-12-02 DIAGNOSIS — R0989 Other specified symptoms and signs involving the circulatory and respiratory systems: Secondary | ICD-10-CM

## 2012-12-02 DIAGNOSIS — I428 Other cardiomyopathies: Secondary | ICD-10-CM

## 2012-12-02 DIAGNOSIS — I10 Essential (primary) hypertension: Secondary | ICD-10-CM

## 2012-12-02 DIAGNOSIS — I38 Endocarditis, valve unspecified: Secondary | ICD-10-CM

## 2012-12-02 MED ORDER — CARVEDILOL 12.5 MG PO TABS: 18.7500 mg | ORAL_TABLET | Freq: Two times a day (BID) | ORAL | Status: AC

## 2012-12-02 NOTE — Assessment & Plan Note (Signed)
We'll order carotid Dopplers to rule out significant progression. Previous study February 2013 yielded less than 50% bilateral ICA stenosis.

## 2012-12-02 NOTE — Patient Instructions (Addendum)
Your physician recommends that you schedule a follow-up appointment in: 2 months. Your physician has recommended you make the following change in your medication: Increase your carvedilol to 18.75 mg twice daily. Please take 1&1/2 twice daily of your 12.5 mg tablets. Your new prescription has been sent to your pharmacy. All other medications will remain the same. Your physician has requested that you have a carotid duplex. This test is an ultrasound of the carotid arteries in your neck. It looks at blood flow through these arteries that supply the brain with blood. Allow one hour for this exam. There are no restrictions or special instructions. Please return for nurse visit in about 2 weeks for blood pressure and heart rate check.

## 2012-12-02 NOTE — Assessment & Plan Note (Signed)
Stable bioprosthetic AVR and MV repair, by recent echocardiogram.

## 2012-12-02 NOTE — Assessment & Plan Note (Signed)
Well-controlled on current medication regimen. Patient admits to recent medication noncompliance, resulting in presentation with HTN urgency. He has since been compliant and is scheduled to resume followup with his primary team at the [facility] clinic in [place].
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
## 2012-12-02 NOTE — Assessment & Plan Note (Signed)
Normalized LVF by recent echocardiogram. Will increase carvedilol to 18.75 twice a day, with target of 25 twice a day. Will schedule RN visit in 2 weeks for BP/HR check; if stable, recommend increasing dose to 25 twice a day.

## 2012-12-02 NOTE — Assessment & Plan Note (Signed)
No further workup indicated. Patient denies any recurrent CP. Recent NL troponins, followed by a negative Lexiscan Myoview. Patient also has history of insignificant CAD, by cardiac catheterization in December 2012.

## 2012-12-04 ENCOUNTER — Other Ambulatory Visit: Payer: Self-pay | Admitting: *Deleted

## 2012-12-04 DIAGNOSIS — R0989 Other specified symptoms and signs involving the circulatory and respiratory systems: Secondary | ICD-10-CM

## 2012-12-09 ENCOUNTER — Encounter (INDEPENDENT_AMBULATORY_CARE_PROVIDER_SITE_OTHER): Payer: Medicare Other

## 2012-12-09 DIAGNOSIS — I6529 Occlusion and stenosis of unspecified carotid artery: Secondary | ICD-10-CM

## 2012-12-09 DIAGNOSIS — R0989 Other specified symptoms and signs involving the circulatory and respiratory systems: Secondary | ICD-10-CM

## 2012-12-16 ENCOUNTER — Ambulatory Visit (INDEPENDENT_AMBULATORY_CARE_PROVIDER_SITE_OTHER): Payer: Medicare Other | Admitting: *Deleted

## 2012-12-16 VITALS — BP 105/61 | HR 72 | Ht 72.0 in | Wt 196.0 lb

## 2012-12-16 DIAGNOSIS — I428 Other cardiomyopathies: Secondary | ICD-10-CM

## 2012-12-20 ENCOUNTER — Encounter: Payer: Self-pay | Admitting: Cardiology

## 2013-01-11 ENCOUNTER — Emergency Department (HOSPITAL_COMMUNITY)
Admission: EM | Admit: 2013-01-11 | Discharge: 2013-01-12 | Disposition: A | Discharge status: Home / Self Care | Payer: Medicare Other | Attending: Emergency Medicine | Admitting: Emergency Medicine

## 2013-01-11 ENCOUNTER — Encounter (HOSPITAL_COMMUNITY): Payer: Self-pay | Admitting: Nurse Practitioner

## 2013-01-11 ENCOUNTER — Emergency Department (HOSPITAL_COMMUNITY): Payer: Medicare Other

## 2013-01-11 DIAGNOSIS — Z8673 Personal history of transient ischemic attack (TIA), and cerebral infarction without residual deficits: Secondary | ICD-10-CM | POA: Insufficient documentation

## 2013-01-11 DIAGNOSIS — N189 Chronic kidney disease, unspecified: Secondary | ICD-10-CM | POA: Insufficient documentation

## 2013-01-11 DIAGNOSIS — Z8659 Personal history of other mental and behavioral disorders: Secondary | ICD-10-CM | POA: Insufficient documentation

## 2013-01-11 DIAGNOSIS — Y9289 Other specified places as the place of occurrence of the external cause: Secondary | ICD-10-CM | POA: Insufficient documentation

## 2013-01-11 DIAGNOSIS — W1809XA Striking against other object with subsequent fall, initial encounter: Secondary | ICD-10-CM | POA: Insufficient documentation

## 2013-01-11 DIAGNOSIS — S298XXA Other specified injuries of thorax, initial encounter: Secondary | ICD-10-CM | POA: Insufficient documentation

## 2013-01-11 DIAGNOSIS — R0789 Other chest pain: Secondary | ICD-10-CM

## 2013-01-11 DIAGNOSIS — Z954 Presence of other heart-valve replacement: Secondary | ICD-10-CM | POA: Insufficient documentation

## 2013-01-11 DIAGNOSIS — Y9389 Activity, other specified: Secondary | ICD-10-CM | POA: Insufficient documentation

## 2013-01-11 DIAGNOSIS — Z8679 Personal history of other diseases of the circulatory system: Secondary | ICD-10-CM | POA: Insufficient documentation

## 2013-01-11 DIAGNOSIS — N400 Enlarged prostate without lower urinary tract symptoms: Secondary | ICD-10-CM | POA: Insufficient documentation

## 2013-01-11 DIAGNOSIS — Z8781 Personal history of (healed) traumatic fracture: Secondary | ICD-10-CM | POA: Insufficient documentation

## 2013-01-11 DIAGNOSIS — Z9889 Other specified postprocedural states: Secondary | ICD-10-CM | POA: Insufficient documentation

## 2013-01-11 DIAGNOSIS — Z87448 Personal history of other diseases of urinary system: Secondary | ICD-10-CM | POA: Insufficient documentation

## 2013-01-11 DIAGNOSIS — F17200 Nicotine dependence, unspecified, uncomplicated: Secondary | ICD-10-CM | POA: Insufficient documentation

## 2013-01-11 DIAGNOSIS — I129 Hypertensive chronic kidney disease with stage 1 through stage 4 chronic kidney disease, or unspecified chronic kidney disease: Secondary | ICD-10-CM | POA: Insufficient documentation

## 2013-01-11 DIAGNOSIS — I509 Heart failure, unspecified: Secondary | ICD-10-CM | POA: Insufficient documentation

## 2013-01-11 LAB — CBC
Hemoglobin: 13.6 g/dL (ref 13.0–17.0)
MCH: 27 pg (ref 26.0–34.0)
MCHC: 34.4 g/dL (ref 30.0–36.0)
RDW: 14.7 % (ref 11.5–15.5)

## 2013-01-11 LAB — COMPREHENSIVE METABOLIC PANEL
ALT: 11 U/L (ref 0–53)
Albumin: 4.1 g/dL (ref 3.5–5.2)
Calcium: 9.3 mg/dL (ref 8.4–10.5)
GFR calc Af Amer: 50 mL/min — ABNORMAL LOW (ref >90)
Glucose, Bld: 100 mg/dL — ABNORMAL HIGH (ref 70–99)
Sodium: 134 mEq/L — ABNORMAL LOW (ref 135–145)
Total Protein: 7 g/dL (ref 6.0–8.3)

## 2013-01-11 LAB — ETHANOL: Alcohol, Ethyl (B): 72 mg/dL — ABNORMAL HIGH (ref 0–11)

## 2013-01-11 LAB — POCT I-STAT TROPONIN I

## 2013-01-11 MED ORDER — ONDANSETRON HCL 4 MG/2ML IJ SOLN
4.0000 mg | Freq: Once | INTRAMUSCULAR | Status: AC
  Administered 2013-01-11: 4 mg via INTRAVENOUS
  Filled 2013-01-11: qty 2

## 2013-01-11 MED ORDER — MORPHINE SULFATE 4 MG/ML IJ SOLN
4.0000 mg | Freq: Once | INTRAMUSCULAR | Status: AC
  Administered 2013-01-11: 4 mg via INTRAVENOUS
  Filled 2013-01-11: qty 1
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
## 2013-01-12 MED ORDER — OXYCODONE-ACETAMINOPHEN 5-325 MG PO TABS: 1.0000 | ORAL_TABLET | ORAL | Status: DC | PRN

## 2013-01-12 NOTE — ED Notes (Signed)
Pt states he has no transportation to get home since he stays in [place]. Consulting civil engineer notified

## 2013-01-12 NOTE — ED Notes (Signed)
MD at bedside.Knapp 

## 2013-01-12 NOTE — ED Notes (Signed)
Waiting on Ptar and permission from Dr. Norlene Campbell pt can transfer by Ptar

## 2013-01-29 ENCOUNTER — Encounter: Payer: Self-pay | Admitting: Cardiology

## 2013-01-29 DIAGNOSIS — F4310 Post-traumatic stress disorder, unspecified: Secondary | ICD-10-CM | POA: Insufficient documentation

## 2013-01-29 DIAGNOSIS — N189 Chronic kidney disease, unspecified: Secondary | ICD-10-CM | POA: Insufficient documentation

## 2013-01-29 DIAGNOSIS — I5032 Chronic diastolic (congestive) heart failure: Secondary | ICD-10-CM | POA: Insufficient documentation

## 2013-01-29 DIAGNOSIS — I639 Cerebral infarction, unspecified: Secondary | ICD-10-CM | POA: Insufficient documentation

## 2013-01-29 DIAGNOSIS — I4891 Unspecified atrial fibrillation: Secondary | ICD-10-CM | POA: Insufficient documentation

## 2013-01-29 DIAGNOSIS — I340 Nonrheumatic mitral (valve) insufficiency: Secondary | ICD-10-CM | POA: Insufficient documentation

## 2013-01-29 DIAGNOSIS — I739 Peripheral vascular disease, unspecified: Secondary | ICD-10-CM

## 2013-01-29 DIAGNOSIS — N400 Enlarged prostate without lower urinary tract symptoms: Secondary | ICD-10-CM | POA: Insufficient documentation

## 2013-01-29 DIAGNOSIS — R9430 Abnormal result of cardiovascular function study, unspecified: Secondary | ICD-10-CM | POA: Insufficient documentation

## 2013-01-29 DIAGNOSIS — Z952 Presence of prosthetic heart valve: Secondary | ICD-10-CM | POA: Insufficient documentation

## 2013-01-29 DIAGNOSIS — I351 Nonrheumatic aortic (valve) insufficiency: Secondary | ICD-10-CM | POA: Insufficient documentation

## 2013-01-29 DIAGNOSIS — R079 Chest pain, unspecified: Secondary | ICD-10-CM | POA: Insufficient documentation

## 2013-01-29 DIAGNOSIS — I7100 Dissection of unspecified site of aorta: Secondary | ICD-10-CM | POA: Insufficient documentation

## 2013-02-01 ENCOUNTER — Ambulatory Visit (INDEPENDENT_AMBULATORY_CARE_PROVIDER_SITE_OTHER): Payer: Medicare Other | Admitting: Cardiology

## 2013-02-01 ENCOUNTER — Encounter: Payer: Self-pay | Admitting: Cardiology

## 2013-02-01 VITALS — BP 132/75 | HR 69 | Ht 75.0 in | Wt 185.0 lb

## 2013-02-01 DIAGNOSIS — R079 Chest pain, unspecified: Secondary | ICD-10-CM

## 2013-02-01 DIAGNOSIS — Z952 Presence of prosthetic heart valve: Secondary | ICD-10-CM

## 2013-02-01 DIAGNOSIS — I10 Essential (primary) hypertension: Secondary | ICD-10-CM

## 2013-02-01 DIAGNOSIS — Z954 Presence of other heart-valve replacement: Secondary | ICD-10-CM

## 2013-02-01 NOTE — Assessment & Plan Note (Signed)
His aortic valve is working well. The bioprosthesis is functioning normally by echo. No further workup.

## 2013-02-01 NOTE — Assessment & Plan Note (Signed)
He's not having any recurring chest pain. No further workup.

## 2013-02-01 NOTE — Patient Instructions (Addendum)
Your physician recommends that you schedule a follow-up appointment in: 9 months. You will receive a reminder letter in the mail in about 6 months reminding you to call and schedule your appointment. If you don't receive this letter, please contact our office. Your physician recommends that you continue on your current medications as directed. Please refer to the Current Medication list given to you today. 
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
## 2013-02-01 NOTE — Assessment & Plan Note (Signed)
Hypertension is under good control as long as he is taking his medicines.

## 2013-10-29 ENCOUNTER — Encounter: Payer: Self-pay | Admitting: Cardiology

## 2013-10-29 ENCOUNTER — Ambulatory Visit (INDEPENDENT_AMBULATORY_CARE_PROVIDER_SITE_OTHER): Payer: Commercial Managed Care - HMO | Admitting: Cardiology

## 2013-10-29 VITALS — BP 112/65 | HR 85 | Ht 75.0 in | Wt 210.0 lb

## 2013-10-29 DIAGNOSIS — Z952 Presence of prosthetic heart valve: Secondary | ICD-10-CM

## 2013-10-29 DIAGNOSIS — I7100 Dissection of unspecified site of aorta: Secondary | ICD-10-CM

## 2013-10-29 DIAGNOSIS — I10 Essential (primary) hypertension: Secondary | ICD-10-CM

## 2013-10-29 DIAGNOSIS — R079 Chest pain, unspecified: Secondary | ICD-10-CM

## 2013-10-29 DIAGNOSIS — Z954 Presence of other heart-valve replacement: Secondary | ICD-10-CM

## 2013-10-29 NOTE — Assessment & Plan Note (Signed)
Blood pressure is well controlled. No change in therapy. 

## 2013-10-29 NOTE — Assessment & Plan Note (Signed)
His valve is working well. No further workup.
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
## 2013-10-29 NOTE — Assessment & Plan Note (Signed)
He's not had any recurrent chest pain. No further workup 

## 2013-10-29 NOTE — Patient Instructions (Signed)

## 2013-10-29 NOTE — Assessment & Plan Note (Signed)
He had aortic dissection in 2010. He then had more definitive surgery with redo in 2012.

## 2014-07-20 ENCOUNTER — Encounter (HOSPITAL_COMMUNITY): Payer: Self-pay | Admitting: Cardiology
